# Patient Record
Sex: Female | Born: 1945 | Race: White | Hispanic: No | Marital: Married | Smoking: Never smoker
Health system: Southern US, Community
[De-identification: ages and names within clinical notes are randomized; demographics above are authoritative.]

## PROBLEM LIST (undated history)

## (undated) DIAGNOSIS — T884XXA Failed or difficult intubation, initial encounter: Secondary | ICD-10-CM

## (undated) DIAGNOSIS — R87619 Unspecified abnormal cytological findings in specimens from cervix uteri: Secondary | ICD-10-CM

## (undated) DIAGNOSIS — N870 Mild cervical dysplasia: Secondary | ICD-10-CM

## (undated) DIAGNOSIS — T8859XA Other complications of anesthesia, initial encounter: Secondary | ICD-10-CM

## (undated) DIAGNOSIS — M549 Dorsalgia, unspecified: Secondary | ICD-10-CM

## (undated) DIAGNOSIS — M542 Cervicalgia: Secondary | ICD-10-CM

## (undated) DIAGNOSIS — B001 Herpesviral vesicular dermatitis: Secondary | ICD-10-CM

## (undated) DIAGNOSIS — M4307 Spondylolysis, lumbosacral region: Secondary | ICD-10-CM

## (undated) DIAGNOSIS — M1990 Unspecified osteoarthritis, unspecified site: Secondary | ICD-10-CM

## (undated) DIAGNOSIS — J383 Other diseases of vocal cords: Secondary | ICD-10-CM

## (undated) DIAGNOSIS — I499 Cardiac arrhythmia, unspecified: Secondary | ICD-10-CM

## (undated) DIAGNOSIS — M5127 Other intervertebral disc displacement, lumbosacral region: Secondary | ICD-10-CM

## (undated) DIAGNOSIS — S32009K Unspecified fracture of unspecified lumbar vertebra, subsequent encounter for fracture with nonunion: Secondary | ICD-10-CM

## (undated) DIAGNOSIS — G4700 Insomnia, unspecified: Secondary | ICD-10-CM

## (undated) HISTORY — PX: CATARACT EXTRACTION W/ INTRAOCULAR LENS  IMPLANT, BILATERAL: SHX1307

## (undated) HISTORY — DX: Unspecified abnormal cytological findings in specimens from cervix uteri: R87.619

## (undated) HISTORY — PX: DILATION AND CURETTAGE OF UTERUS: SHX78

## (undated) HISTORY — PX: COLPOSCOPY: SHX161

## (undated) HISTORY — DX: Herpesviral vesicular dermatitis: B00.1

## (undated) HISTORY — DX: Unspecified fracture of unspecified lumbar vertebra, subsequent encounter for fracture with nonunion: S32.009K

## (undated) HISTORY — DX: Other diseases of vocal cords: J38.3

## (undated) HISTORY — DX: Other intervertebral disc displacement, lumbosacral region: M51.27

## (undated) HISTORY — PX: TONSILLECTOMY: SUR1361

## (undated) HISTORY — PX: BACK SURGERY: SHX140

## (undated) HISTORY — PX: TUBAL LIGATION: SHX77

## (undated) HISTORY — DX: Dorsalgia, unspecified: M54.9

## (undated) HISTORY — DX: Spondylolysis, lumbosacral region: M43.07

## (undated) HISTORY — PX: COLONOSCOPY: SHX174

## (undated) HISTORY — DX: Cervicalgia: M54.2

## (undated) HISTORY — DX: Mild cervical dysplasia: N87.0

## (undated) HISTORY — PX: KNEE SURGERY: SHX244

## (undated) HISTORY — PX: JOINT REPLACEMENT: SHX530

---

## 1997-11-02 ENCOUNTER — Other Ambulatory Visit: Admission: RE | Admit: 1997-11-02 | Discharge: 1997-11-02 | Payer: Self-pay | Admitting: Obstetrics and Gynecology

## 1998-11-09 ENCOUNTER — Other Ambulatory Visit: Admission: RE | Admit: 1998-11-09 | Discharge: 1998-11-09 | Payer: Self-pay | Admitting: Obstetrics and Gynecology

## 1998-11-25 ENCOUNTER — Encounter (INDEPENDENT_AMBULATORY_CARE_PROVIDER_SITE_OTHER): Payer: Self-pay

## 1998-11-25 ENCOUNTER — Other Ambulatory Visit: Admission: RE | Admit: 1998-11-25 | Discharge: 1998-11-25 | Payer: Self-pay | Admitting: Obstetrics and Gynecology

## 2000-02-08 ENCOUNTER — Other Ambulatory Visit: Admission: RE | Admit: 2000-02-08 | Discharge: 2000-02-08 | Payer: Self-pay | Admitting: Obstetrics and Gynecology

## 2000-08-14 ENCOUNTER — Encounter: Admission: RE | Admit: 2000-08-14 | Discharge: 2000-08-14 | Payer: Self-pay | Admitting: Obstetrics and Gynecology

## 2000-08-14 ENCOUNTER — Encounter: Payer: Self-pay | Admitting: Obstetrics and Gynecology

## 2001-02-14 ENCOUNTER — Other Ambulatory Visit: Admission: RE | Admit: 2001-02-14 | Discharge: 2001-02-14 | Payer: Self-pay | Admitting: Obstetrics and Gynecology

## 2001-10-14 ENCOUNTER — Encounter: Admission: RE | Admit: 2001-10-14 | Discharge: 2001-10-14 | Payer: Self-pay | Admitting: Obstetrics and Gynecology

## 2001-10-14 ENCOUNTER — Encounter: Payer: Self-pay | Admitting: Obstetrics and Gynecology

## 2002-01-22 HISTORY — PX: ROTATOR CUFF REPAIR: SHX139

## 2002-04-07 ENCOUNTER — Other Ambulatory Visit: Admission: RE | Admit: 2002-04-07 | Discharge: 2002-04-07 | Payer: Self-pay | Admitting: Obstetrics and Gynecology

## 2003-05-22 ENCOUNTER — Emergency Department (HOSPITAL_COMMUNITY): Admission: EM | Admit: 2003-05-22 | Discharge: 2003-05-22 | Payer: Self-pay | Admitting: Emergency Medicine

## 2003-06-16 ENCOUNTER — Encounter: Admission: RE | Admit: 2003-06-16 | Discharge: 2003-06-16 | Payer: Self-pay | Admitting: Obstetrics and Gynecology

## 2003-06-24 ENCOUNTER — Other Ambulatory Visit: Admission: RE | Admit: 2003-06-24 | Discharge: 2003-06-24 | Payer: Self-pay | Admitting: Obstetrics and Gynecology

## 2004-01-23 HISTORY — PX: CERVICAL BIOPSY  W/ LOOP ELECTRODE EXCISION: SUR135

## 2004-06-29 ENCOUNTER — Other Ambulatory Visit: Admission: RE | Admit: 2004-06-29 | Discharge: 2004-06-29 | Payer: Self-pay | Admitting: Addiction Medicine

## 2004-08-15 ENCOUNTER — Encounter: Admission: RE | Admit: 2004-08-15 | Discharge: 2004-08-15 | Payer: Self-pay | Admitting: Obstetrics and Gynecology

## 2005-03-07 ENCOUNTER — Other Ambulatory Visit: Admission: RE | Admit: 2005-03-07 | Discharge: 2005-03-07 | Payer: Self-pay | Admitting: Obstetrics and Gynecology

## 2005-09-07 ENCOUNTER — Encounter: Admission: RE | Admit: 2005-09-07 | Discharge: 2005-09-07 | Payer: Self-pay | Admitting: Obstetrics and Gynecology

## 2005-10-01 ENCOUNTER — Other Ambulatory Visit: Admission: RE | Admit: 2005-10-01 | Discharge: 2005-10-01 | Payer: Self-pay | Admitting: Obstetrics and Gynecology

## 2006-03-25 ENCOUNTER — Other Ambulatory Visit: Admission: RE | Admit: 2006-03-25 | Discharge: 2006-03-25 | Payer: Self-pay | Admitting: Obstetrics and Gynecology

## 2006-10-25 ENCOUNTER — Other Ambulatory Visit: Admission: RE | Admit: 2006-10-25 | Discharge: 2006-10-25 | Payer: Self-pay | Admitting: Obstetrics and Gynecology

## 2006-10-28 ENCOUNTER — Encounter: Admission: RE | Admit: 2006-10-28 | Discharge: 2006-10-28 | Payer: Self-pay | Admitting: Obstetrics and Gynecology

## 2006-11-04 ENCOUNTER — Encounter: Admission: RE | Admit: 2006-11-04 | Discharge: 2006-11-04 | Payer: Self-pay | Admitting: Obstetrics and Gynecology

## 2007-02-21 ENCOUNTER — Other Ambulatory Visit: Admission: RE | Admit: 2007-02-21 | Discharge: 2007-02-21 | Payer: Self-pay | Admitting: Obstetrics and Gynecology

## 2007-04-03 ENCOUNTER — Encounter: Admission: RE | Admit: 2007-04-03 | Discharge: 2007-04-03 | Payer: Self-pay | Admitting: Neurology

## 2007-08-18 ENCOUNTER — Inpatient Hospital Stay (HOSPITAL_COMMUNITY): Admission: RE | Admit: 2007-08-18 | Discharge: 2007-08-21 | Payer: Self-pay | Admitting: Orthopedic Surgery

## 2007-09-08 ENCOUNTER — Encounter: Admission: RE | Admit: 2007-09-08 | Discharge: 2007-10-08 | Payer: Self-pay | Admitting: Orthopedic Surgery

## 2007-10-31 ENCOUNTER — Encounter: Admission: RE | Admit: 2007-10-31 | Discharge: 2007-10-31 | Payer: Self-pay | Admitting: Obstetrics and Gynecology

## 2007-11-07 ENCOUNTER — Other Ambulatory Visit: Admission: RE | Admit: 2007-11-07 | Discharge: 2007-11-07 | Payer: Self-pay | Admitting: Obstetrics and Gynecology

## 2007-11-07 ENCOUNTER — Ambulatory Visit: Payer: Self-pay | Admitting: Obstetrics and Gynecology

## 2007-11-07 ENCOUNTER — Encounter: Payer: Self-pay | Admitting: Obstetrics and Gynecology

## 2007-11-10 ENCOUNTER — Encounter: Admission: RE | Admit: 2007-11-10 | Discharge: 2007-11-10 | Payer: Self-pay | Admitting: Obstetrics and Gynecology

## 2007-11-24 ENCOUNTER — Encounter: Admission: RE | Admit: 2007-11-24 | Discharge: 2007-11-24 | Payer: Self-pay | Admitting: Obstetrics and Gynecology

## 2008-02-02 ENCOUNTER — Ambulatory Visit (HOSPITAL_COMMUNITY): Admission: RE | Admit: 2008-02-02 | Discharge: 2008-02-02 | Payer: Self-pay | Admitting: Internal Medicine

## 2008-04-28 ENCOUNTER — Encounter: Admission: RE | Admit: 2008-04-28 | Discharge: 2008-04-28 | Payer: Self-pay | Admitting: Obstetrics and Gynecology

## 2008-06-30 ENCOUNTER — Encounter: Payer: Self-pay | Admitting: Obstetrics and Gynecology

## 2008-06-30 ENCOUNTER — Ambulatory Visit: Payer: Self-pay | Admitting: Obstetrics and Gynecology

## 2008-06-30 ENCOUNTER — Other Ambulatory Visit: Admission: RE | Admit: 2008-06-30 | Discharge: 2008-06-30 | Payer: Self-pay | Admitting: Obstetrics and Gynecology

## 2008-07-21 ENCOUNTER — Ambulatory Visit (HOSPITAL_COMMUNITY): Admission: RE | Admit: 2008-07-21 | Discharge: 2008-07-21 | Payer: Self-pay | Admitting: Specialist

## 2008-07-29 ENCOUNTER — Encounter: Admission: RE | Admit: 2008-07-29 | Discharge: 2008-07-29 | Payer: Self-pay | Admitting: Specialist

## 2008-08-12 ENCOUNTER — Encounter: Admission: RE | Admit: 2008-08-12 | Discharge: 2008-08-12 | Payer: Self-pay | Admitting: Specialist

## 2008-09-03 ENCOUNTER — Encounter: Admission: RE | Admit: 2008-09-03 | Discharge: 2008-09-03 | Payer: Self-pay | Admitting: Specialist

## 2008-11-03 ENCOUNTER — Encounter: Admission: RE | Admit: 2008-11-03 | Discharge: 2008-11-03 | Payer: Self-pay | Admitting: Obstetrics and Gynecology

## 2008-12-01 ENCOUNTER — Other Ambulatory Visit: Admission: RE | Admit: 2008-12-01 | Discharge: 2008-12-01 | Payer: Self-pay | Admitting: Obstetrics and Gynecology

## 2008-12-01 ENCOUNTER — Encounter: Payer: Self-pay | Admitting: Obstetrics and Gynecology

## 2008-12-01 ENCOUNTER — Ambulatory Visit: Payer: Self-pay | Admitting: Obstetrics and Gynecology

## 2009-01-31 ENCOUNTER — Encounter: Admission: RE | Admit: 2009-01-31 | Discharge: 2009-01-31 | Payer: Self-pay | Admitting: Specialist

## 2009-05-26 ENCOUNTER — Other Ambulatory Visit: Admission: RE | Admit: 2009-05-26 | Discharge: 2009-05-26 | Payer: Self-pay | Admitting: Obstetrics and Gynecology

## 2009-05-26 ENCOUNTER — Ambulatory Visit: Payer: Self-pay | Admitting: Obstetrics and Gynecology

## 2009-05-27 ENCOUNTER — Ambulatory Visit (HOSPITAL_COMMUNITY): Admission: RE | Admit: 2009-05-27 | Discharge: 2009-05-27 | Payer: Self-pay | Admitting: Neurological Surgery

## 2009-06-09 ENCOUNTER — Inpatient Hospital Stay (HOSPITAL_COMMUNITY): Admission: RE | Admit: 2009-06-09 | Discharge: 2009-06-11 | Payer: Self-pay | Admitting: Neurological Surgery

## 2009-07-05 ENCOUNTER — Encounter (INDEPENDENT_AMBULATORY_CARE_PROVIDER_SITE_OTHER): Payer: Self-pay | Admitting: *Deleted

## 2009-12-08 ENCOUNTER — Encounter: Admission: RE | Admit: 2009-12-08 | Discharge: 2009-12-08 | Payer: Self-pay | Admitting: Obstetrics and Gynecology

## 2009-12-19 ENCOUNTER — Other Ambulatory Visit: Admission: RE | Admit: 2009-12-19 | Discharge: 2009-12-19 | Payer: Self-pay | Admitting: Obstetrics and Gynecology

## 2009-12-19 ENCOUNTER — Ambulatory Visit: Payer: Self-pay | Admitting: Obstetrics and Gynecology

## 2009-12-19 ENCOUNTER — Encounter (INDEPENDENT_AMBULATORY_CARE_PROVIDER_SITE_OTHER): Payer: Self-pay | Admitting: *Deleted

## 2009-12-22 ENCOUNTER — Encounter (INDEPENDENT_AMBULATORY_CARE_PROVIDER_SITE_OTHER): Payer: Self-pay | Admitting: *Deleted

## 2009-12-23 ENCOUNTER — Ambulatory Visit: Payer: Self-pay | Admitting: Internal Medicine

## 2010-01-19 ENCOUNTER — Ambulatory Visit: Payer: Self-pay | Admitting: Internal Medicine

## 2010-01-19 ENCOUNTER — Encounter (INDEPENDENT_AMBULATORY_CARE_PROVIDER_SITE_OTHER): Payer: Self-pay | Admitting: *Deleted

## 2010-02-21 NOTE — Letter (Signed)
Summary: Colonoscopy Letter  [facility]  [address] [place], [place] [address]   Phone: [phone]  Fax: [phone]      July 05, 2009 MRN: [id number]   Catherine [name] [address] [place], [place]  [address]   Dear Ms. Spirito,   According to your medical record, it is time for you to schedule a Colonoscopy. The American Cancer Society recommends this procedure as a method to detect early colon cancer. Patients with a family history of colon cancer, or a personal history of colon polyps or inflammatory bowel disease are at increased risk.  This letter has beeen generated based on the recommendations made at the time of your procedure. If you feel that in your particular situation this may no longer apply, please contact our office.  Please call our office at [phone] to schedule this appointment or to update your records at your earliest convenience.  Thank you for cooperating with [place] to provide you with the very best care possible.   Sincerely,  Hedwig Morton. Juanda Chance, M.D.  [facility] Gastroenterology Division [phone]

## 2010-02-21 NOTE — Letter (Signed)
Summary: [facility] Instructions  [facility]  [address] [place], [place] [address]   Phone: [phone]  Fax: [phone]       Catherine [name]    Jul 28, 1945    MRN: [id number]       Procedure Day Dorna Bloom:  Lenor Coffin  01/19/10     Arrival Time: 8:00AM     Procedure Time: 9:00AM    Location of Procedure:                    _ X_  [facility] (4th Floor)   PREPARATION FOR COLONOSCOPY WITH MIRALAX  Starting 5 days prior to your procedure 01/14/10 do not eat nuts, seeds, popcorn, corn, beans, peas,  salads, or any raw vegetables.  Do not take any fiber supplements (e.g. Metamucil, Citrucel, and Benefiber). ____________________________________________________________________________________________________   THE DAY BEFORE YOUR PROCEDURE         DATE: 01/18/10  DAY: WEDNESDAY  1   Drink clear liquids the entire day-NO SOLID FOOD  2   Do not drink anything colored red or purple.  Avoid juices with pulp.  No orange juice.  3   Drink at least 64 oz. (8 glasses) of fluid/clear liquids during the day to prevent dehydration and help the prep work efficiently.  CLEAR LIQUIDS INCLUDE: Water Jello Ice Popsicles Tea (sugar ok, no milk/cream) Powdered fruit flavored drinks Coffee (sugar ok, no milk/cream) Gatorade Juice: apple, white grape, white cranberry  Lemonade Clear bullion, consomm, broth Carbonated beverages (any kind) Strained chicken noodle soup Hard Candy  4   Mix the entire bottle of Miralax with 64 oz. of Gatorade/Powerade in the morning and put in the refrigerator to chill.  5   At 3:00 pm take 2 Dulcolax/Bisacodyl tablets.  6   At 4:30 pm take one Reglan/Metoclopramide tablet.  7  Starting at 5:00 pm drink one 8 oz glass of the Miralax mixture every 15-20 minutes until you have finished drinking the entire 64 oz.  You should finish drinking prep around 7:30 or 8:00 pm.  8   If you are nauseated, you may take the 2nd Reglan/Metoclopramide  tablet at 6:30 pm.        9    At 8:00 pm take 2 more DULCOLAX/Bisacodyl tablets.     THE DAY OF YOUR PROCEDURE      DATE:  01/19/10  DAY: Lenor Coffin  You may drink clear liquids until 7:00AM  (2 HOURS BEFORE PROCEDURE).   MEDICATION INSTRUCTIONS  Unless otherwise instructed, you should take regular prescription medications with a small sip of water as early as possible the morning of your procedure.        OTHER INSTRUCTIONS  You will need a responsible adult at least 65 years of age to accompany you and drive you home.   This person must remain in the waiting room during your procedure.  Wear loose fitting clothing that is easily removed.  Leave jewelry and other valuables at home.  However, you may wish to bring a book to read or an iPod/MP3 player to listen to music as you wait for your procedure to start.  Remove all body piercing jewelry and leave at home.  Total time from sign-in until discharge is approximately 2-3 hours.  You should go home directly after your procedure and rest.  You can resume normal activities the day after your procedure.  The day of your procedure you should not:   Drive   Make legal decisions  Operate machinery   Drink alcohol   Return to work  You will receive specific instructions about eating, activities and medications before you leave.   The above instructions have been reviewed and explained to me by   Wyona Almas RN  December 23, 2009 2:48 PM     I fully understand and can verbalize these instructions _____________________________ Date _______

## 2010-02-21 NOTE — Miscellaneous (Signed)
Summary: [facility] Previsit/prep  Clinical Lists Changes  Medications: Added new medication of DULCOLAX 5 MG  TBEC (BISACODYL) Day before procedure take 2 at 3pm and 2 at 8pm. - Signed Added new medication of METOCLOPRAMIDE HCL 10 MG  TABS (METOCLOPRAMIDE HCL) As per prep instructions. - Signed Added new medication of MIRALAX   POWD (POLYETHYLENE GLYCOL 3350) As per prep  instructions. - Signed Rx of DULCOLAX 5 MG  TBEC (BISACODYL) Day before procedure take 2 at 3pm and 2 at 8pm.;  #4 x 0;  Signed;  Entered by: Wyona Almas RN;  Authorized by: Hart Carwin MD;  Method used: Electronically to [facility]*, [address] Shipping/mailing, [place], [place]  [address], Ph: [phone], Fax: [phone] Rx of METOCLOPRAMIDE HCL 10 MG  TABS (METOCLOPRAMIDE HCL) As per prep instructions.;  #2 x 0;  Signed;  Entered by: Wyona Almas RN;  Authorized by: Hart Carwin MD;  Method used: Electronically to [facility]*, [address] Shipping/mailing, [place], [place]  [address], Ph: [phone], Fax: [phone] Rx of MIRALAX   POWD (POLYETHYLENE GLYCOL 3350) As per prep  instructions.;  #255gm x 0;  Signed;  Entered by: Wyona Almas RN;  Authorized by: Hart Carwin MD;  Method used: Electronically to [facility]*, [address] Shipping/mailing, [place], [place]  [address], Ph: [phone], Fax: [phone] Observations: Added new observation of NKA: T (12/23/2009 14:08)    Prescriptions: MIRALAX   POWD (POLYETHYLENE GLYCOL 3350) As per prep  instructions.  #255gm x 0   Entered by:   Wyona Almas RN   Authorized by:   Hart Carwin MD   Signed by:   Wyona Almas RN on 12/23/2009   Method used:   Electronically to        [facility]* (retail)       [address]       [address] Shipping/mailing       [place], [place]  [address]       Ph: [phone]       Fax: [phone]  RxID:   [id number] METOCLOPRAMIDE HCL 10 MG  TABS (METOCLOPRAMIDE HCL) As per prep instructions.  #2 x 0   Entered by:   Wyona Almas RN   Authorized by:   Hart Carwin MD   Signed by:   Wyona Almas RN on 12/23/2009   Method used:   Electronically to        [facility]* (retail)       [address]       [address] Shipping/mailing       [place], [place]  [address]       Ph: [phone]       Fax: [phone]   RxID:   [id number] DULCOLAX 5 MG  TBEC (BISACODYL) Day before procedure take 2 at 3pm and 2 at 8pm.  #4 x 0   Entered by:   Wyona Almas RN   Authorized by:   Hart Carwin MD   Signed by:   Wyona Almas RN on 12/23/2009   Method used:   Electronically to        [facility]* (retail)       [address]       [address] Shipping/mailing       [place], [place]  [address]       Ph: [phone]       Fax: [phone]   RxID:   [id number]

## 2010-02-21 NOTE — Letter (Signed)
Summary: Pre Visit Letter Revised  [facility]  [address] [place], [place] [address]   Phone: [phone]  Fax: [phone]        12/19/2009 MRN: [id number] Catherine [name] [address] [place], [place]  [address]             Procedure Date:  01/03/2010  Welcome to the Gastroenterology Division at [facility].    You are scheduled to see a nurse for your pre-procedure visit on 12/23/2009 at 2:00 on the 3rd floor at [facility], [address]  We ask that you try to arrive at our office 15 minutes prior to your appointment time to allow for check-in.  Please take a minute to review the attached form.  If you answer "Yes" to one or more of the questions on the first page, we ask that you call the person listed at your earliest opportunity.  If you answer "No" to all of the questions, please complete the rest of the form and bring it to your appointment.    Your nurse visit will consist of discussing your medical and surgical history, your immediate family medical history, and your medications.   If you are unable to list all of your medications on the form, please bring the medication bottles to your appointment and we will list them.  We will need to be aware of both prescribed and over the counter drugs.  We will need to know exact dosage information as well.    Please be prepared to read and sign documents such as consent forms, a financial agreement, and acknowledgement forms.  If necessary, and with your consent, a friend or relative is welcome to sit-in on the nurse visit with you.  Please bring your insurance card so that we may make a copy of it.  If your insurance requires a referral to see a specialist, please bring your referral form from your primary care physician.  No co-pay is required for this nurse visit.     If you cannot keep your appointment, please call [phone] to cancel or reschedule prior to your appointment date.  This allows  [place] the opportunity to schedule an appointment for another patient in need of care.    Thank you for choosing [facility] for your medical needs.  We appreciate the opportunity to care for you.  Please visit [place] at our website  to learn more about our practice.  Sincerely, The Gastroenterology Division

## 2010-02-23 NOTE — Procedures (Signed)
Summary: Colonoscopy  Patient: [name] Note: All result statuses are Final unless otherwise noted.  Tests: (1) Colonoscopy (COL)   COL Colonoscopy           DONE     [facility]     [address]     [place], [place]  [address]           COLONOSCOPY PROCEDURE REPORT           PATIENT:  Catherine [name], Catherine [name]  MR#:  [id number]     BIRTHDATE:  28-Jun-1945, 64 yrs. old  GENDER:  female     ENDOSCOPIST:  Hedwig Morton. Juanda Chance, MD     REF. BY:  Theressa Millard, M.D.     PROCEDURE DATE:  01/19/2010     PROCEDURE:  Colonoscopy [address]     ASA CLASS:  Class I     INDICATIONS:  Routine Risk Screening last colon 2001,     diverticulosis     MEDICATIONS:   Versed 10 mg, Fentanyl 75 mcg           DESCRIPTION OF PROCEDURE:   After the risks benefits and     alternatives of the procedure were thoroughly explained, informed     consent was obtained.  Digital rectal exam was performed and     revealed no rectal masses.   The LB [id number]-[id number] endoscope     was introduced through the anus and advanced to the cecum, which     was identified by both the appendix and ileocecal valve, without     limitations.  The quality of the prep was excellent, using     MiraLax.  The instrument was then slowly withdrawn as the colon     was fully examined.     <<PROCEDUREIMAGES>>           FINDINGS:  Mild diverticulosis was found in the sigmoid colon (see     image1, image2, image6, and image7).  This was otherwise a normal     examination of the colon (see image8, image3, image4, and image5).     Retroflexed views in the rectum revealed no abnormalities.    The     scope was then withdrawn from the patient and the procedure     completed.           COMPLICATIONS:  None     ENDOSCOPIC IMPRESSION:     1) Mild diverticulosis in the sigmoid colon     2) Otherwise normal examination     RECOMMENDATIONS:     1) high fiber diet     REPEAT EXAM:  In 10 year(s) for.            ______________________________     Hedwig Morton. Juanda Chance, MD           CC:           n.     eSIGNED:   Hedwig Morton. Creedon Danielski at 01/19/2010 09:49 AM           Sid Falcon, [id number]  Note: An exclamation mark (!) indicates a result that was not dispersed into the flowsheet. Document Creation Date: 01/19/2010 9:49 AM _______________________________________________________________________  (1) Order result status: Final Collection or observation date-time: 01/19/2010 09:42 Requested date-time:  Receipt date-time:  Reported date-time:  Referring Physician:   Ordering Physician: Lina Sar [phone]) Specimen Source:  Source: Launa Grill Order Number: [id number] Lab site:   Appended Document: Colonoscopy Recall in [facility] for 10 years.  Appended Document: Colonoscopy    Clinical Lists Changes  Observations: Added new observation of COLONNXTDUE: 12/2019 (01/19/2010 14:38)

## 2010-02-23 NOTE — Miscellaneous (Signed)
  Clinical Lists Changes  Observations: Added new observation of COLONNXTDUE: 12/2019 (01/19/2010 9:52) Added new observation of COLONOSCOPY: 01/19/2010 (01/19/2010 9:52)

## 2010-04-11 LAB — CBC
HCT: 41.6 % (ref 36.0–46.0)
Hemoglobin: 14.6 g/dL (ref 12.0–15.0)
RBC: 4.38 MIL/uL (ref 3.87–5.11)
WBC: 5.5 10*3/uL (ref 4.0–10.5)

## 2010-04-11 LAB — BASIC METABOLIC PANEL
Calcium: 9.4 mg/dL (ref 8.4–10.5)
GFR calc Af Amer: >60 mL/min (ref >60)
GFR calc non Af Amer: >60 mL/min (ref >60)
Potassium: 4.5 mEq/L (ref 3.5–5.1)
Sodium: 138 mEq/L (ref 135–145)

## 2010-04-11 LAB — ABO/RH: ABO/RH(D): O POS

## 2010-04-11 LAB — TYPE AND SCREEN
ABO/RH(D): O POS
Antibody Screen: NEGATIVE

## 2010-04-11 LAB — SURGICAL PCR SCREEN: MRSA, PCR: NEGATIVE

## 2010-06-06 ENCOUNTER — Other Ambulatory Visit: Payer: Self-pay | Admitting: Orthopedic Surgery

## 2010-06-06 ENCOUNTER — Encounter (HOSPITAL_COMMUNITY): Payer: 59

## 2010-06-06 LAB — COMPREHENSIVE METABOLIC PANEL
ALT: 10 U/L (ref 0–35)
AST: 16 U/L (ref 0–37)
Albumin: 4.1 g/dL (ref 3.5–5.2)
Alkaline Phosphatase: 60 U/L (ref 39–117)
Calcium: 9.3 mg/dL (ref 8.4–10.5)
GFR calc Af Amer: >60 mL/min (ref >60)
Glucose, Bld: 102 mg/dL — ABNORMAL HIGH (ref 70–99)
Potassium: 4.4 mEq/L (ref 3.5–5.1)
Sodium: 136 mEq/L (ref 135–145)
Total Protein: 6.3 g/dL (ref 6.0–8.3)

## 2010-06-06 LAB — URINALYSIS, ROUTINE W REFLEX MICROSCOPIC
Bilirubin Urine: NEGATIVE
Hgb urine dipstick: NEGATIVE
Nitrite: NEGATIVE
Protein, ur: NEGATIVE mg/dL
Specific Gravity, Urine: 1.017 (ref 1.005–1.030)
Urobilinogen, UA: 0.2 mg/dL (ref 0.0–1.0)

## 2010-06-06 LAB — CBC
HCT: 39.6 % (ref 36.0–46.0)
MCHC: 34.3 g/dL (ref 30.0–36.0)
MCV: 89.8 fL (ref 78.0–100.0)
Platelets: 271 10*3/uL (ref 150–400)
RDW: 12 % (ref 11.5–15.5)
WBC: 8.1 10*3/uL (ref 4.0–10.5)

## 2010-06-06 LAB — PROTIME-INR
INR: 0.99 (ref 0.00–1.49)
Prothrombin Time: 13.3 seconds (ref 11.6–15.2)

## 2010-06-06 LAB — URINE MICROSCOPIC-ADD ON

## 2010-06-06 NOTE — Op Note (Signed)
[name], [name] NO.:  [id number]   MEDICAL RECORD NO.:  [id number]          PATIENT TYPE:  INP   LOCATION:  0010                         FACILITY:  [facility]   PHYSICIAN:  Ollen Gross, M.D.    DATE OF BIRTH:  Nov 19, 1945   DATE OF PROCEDURE:  08/18/2007  DATE OF DISCHARGE:                               OPERATIVE REPORT   PREOPERATIVE DIAGNOSIS:  Osteoarthritis, left knee.   POSTOPERATIVE DIAGNOSIS:  Osteoarthritis, left knee.   PROCEDURE:  Left total knee arthroplasty.   SURGEON:  Ollen Gross, M.D.   ASSISTANT:  Avel Peace PA-C   ANESTHESIA:  Spinal.   ESTIMATED BLOOD LOSS:  Minimal.   DRAINS:  None.   TOURNIQUET TIME:  33 minutes at 300 mmHg.   COMPLICATIONS:  None.   CONDITION:  Stable to recovery.   BRIEF CLINICAL NOTE:  Catherine [name] is a 65 year old female who has end-stage  arthritis of the left knee with progressively worsening pain and  dysfunction.  She has failed nonoperative management including  injections and presents for total knee arthroplasty.   PROCEDURE IN DETAIL:  After the successful administration of spinal  anesthetic, a tourniquet was placed high on the left thigh and left  lower extremity prepped and draped in usual sterile fashion.  Extremity  was wrapped in Esmarch, knee flexed, tourniquet inflated to 300 mmHg.  Midline incision made with 10 blade through subcutaneous tissue to the  level of the extensor mechanism.  Fresh blade was used make a lateral  parapatellar arthrotomy.  I went lateral because of preop valgus  deformity.  Soft tissue over proximal lateral tibia subperiosteally  elevated to the posterolateral corner but not including the structures  of the posterolateral corner.  Patella was everted medially, knee flexed  90 degrees, ACL and PCL removed.  She had severe bone-on-bone change in  the lateral compartment both tibia and femur and also erosive changes on  the patella with thinning of the patella.  A drill  was used create a  starting hole in the distal femur and the canal was thoroughly  irrigated.  5 degrees left valgus alignment guide is placed and  referencing off the posterior condyles, rotations marked and the block  pinned to remove 11 mm of distal femur.  Distal femoral resection is  made with an oscillating saw.  Sizing block was placed, size 2.5 was  most appropriate.  Rotations marked off the epicondylar axis.  Size 2.5  cutting block was placed and the anterior, posterior and chamfer cuts  made.   Tibia subluxed forward and menisci removed.  Extramedullary tibial  alignment guide was placed referencing proximally at medial aspect of  the tibial tubercle and distally along the second metatarsal axis and  tibial crest.  Block was pinned to remove 2 mm of the more deficient  lateral side.  Tibial resection was made with an oscillating saw.  Size  2.5 was the most appropriate tibial component and the proximal tibia  prepared the modular drill and keel punch for size 2.5.  Femoral  preparation is completed with the intercondylar cut.  Size 2.5 mobile bearing tibial trial, 2.5 posterior stabilized femoral  trial and a 10 mm posterior stabilized rotating platform insert trial  placed.  With the 10, full extension was achieved with excellent varus-  valgus, anterior-posterior balance throughout full range of motion.  Patella was everted, thickness measured to be only 16 mm.  I removed the  patellar osteophytes and resected down to 11 mm.  I placed a 35  template.  The lug holes were drilled, the trial patella was placed,  size 35 and it tracked normally.  The osteophytes were then removed off  the posterior femur with the trials in place.  All trials removed and  the cut bone surfaces prepared with pulsatile lavage.  Cement was mixed.  Once ready for implantation, size 2.5 mobile bearing tibial tray, 2.5  posterior stabilized femur and 35 patella are cemented in place.  The  patella  was held with a clamp.  Trial 10-mm insert was placed, knee held  in full extension and all extruded cement removed.  Once the cement was  fully hardened, then FloSeal was injected on the posterior capsule and  the permanent 10 mm posterior stabilized rotating platform insert was  placed in the tibial tray.  FloSeal was injected in medial and lateral  gutters and suprapatellar area.  Moist sponge is placed and tourniquet  released with a total time of 33 minutes.  Sponge was held for 2 minutes  then removed.  Minimal bleeding was encountered.  That which was  encountered was stopped with electrocautery.  Wound was copiously  irrigated with saline solution and the arthrotomy closed with  interrupted #1 PDS leaving open a small area from the superior to  inferior pole of patella to serve as a mini lateral release.  Flexion  against gravity to 145 degrees with patellar tracking normally.  Subcu  was closed with interrupted 2-0 Vicryl and subcuticular running 4-0  Monocryl.  The incision was cleaned and dried and Steri-Strips and bulky  sterile dressing applied.  She was then placed into a knee immobilizer,  awakened, transferred recovery in stable condition.      Ollen Gross, M.D.  Electronically Signed     FA/MEDQ  D:  08/18/2007  T:  08/19/2007  Job:  [id number]

## 2010-06-06 NOTE — H&P (Signed)
Catherine, [name] NO.:  [id number]   MEDICAL RECORD NO.:  [id number]          PATIENT TYPE:  INP   LOCATION:  NA                           FACILITY:  [facility]   PHYSICIAN:  Ollen Gross, M.D.    DATE OF BIRTH:  Oct 03, 1945   DATE OF ADMISSION:  08/18/2007  DATE OF DISCHARGE:                              HISTORY & PHYSICAL   CHIEF COMPLAINT:  Left greater than right knee pain.   HISTORY OF PRESENT ILLNESS:  Patient is a 65 year old female has been  seen by Dr. Lequita Halt for ongoing knee problems.  She has had problems for  quite some time now.  Dr. Thomasena Edis has scoped her knees and done lateral  releases in the past.  She has also had multiple series of cortisone and  Hyalgan injections.  The last series of hyaluronic injections did not  benefit her at all.  The left knee has gotten to the point where it is  hurting all the time.  She was referred over by several friends for  possible replacement.  She has been seen by Dr. Lequita Halt and found to  have severe end-stage arthritis of the knees with a 10 degree valgus  malalignment deformity.  It is felt she would benefit undergoing  surgical intervention.  Risks and benefits discussed.  The patient is  subsequently admitted to the  hospital.   ALLERGIES:  No known drug allergies.   CURRENT MEDICATIONS:  Celebrex, Femhrt, generic Ambien, vitamin once a  day, vitamin D, vitamin C, fish oil, flaxseed oil.   PAST MEDICAL HISTORY:  Arthritis.   PAST SURGICAL HISTORY:  1. Right rotator cuff repair.  2. She has had right knee scope with lateral release.  3. Left knee scope with lateral release.  4. Benign tumor of the breast surgery.   SOCIAL HISTORY:  Married, nonsmoker.  She drinks 1-2 glasses of wine a  day. She has 4 children, 2 stepchildren.  Husband will be assisting  with care after surgery.   FAMILY HISTORY:  Father with history of bypass surgery.  Mother with  hypertension.  Sister and brother healthy.   REVIEW OF SYSTEMS:  GENERAL:  No fevers, chills or night sweats.  NEURO:  Little bit of insomnia.  No seizures, syncope or paralysis.  RESPIRATORY:  No shortness breath, productive cough or hemoptysis.  CARDIOVASCULAR:  Chest pain, angina or orthopnea.  GI:  No nausea,  vomiting, diarrhea or constipation.  GU: Little bit of nocturia and  frequency.  No dysuria or hematuria.  MUSCULOSKELETAL:  Knee pain.   PHYSICAL EXAMINATION:  VITAL SIGNS:  Pulse 64, respirations 12, blood  pressure 112/68.  GENERAL:  A 65 year old white female, small petite frame, no acute  distress.  She is alert, oriented and cooperative, pleasant, excellent  historian.  HEENT:  Normocephalic, atraumatic.  Pupils are round and reactive.  EOMs  intact.  NECK:  Supple.  No bruits.  CHEST: Clear.  HEART:  Regular rate and rhythm.  No murmur, S1-S2 noted.  ABDOMEN:  Soft, flat, nontender.  Bowel sounds present.  RECTAL, BREASTS, GENITALIA:  Not  done, not pertinent to present illness.  EXTREMITIES:  Left knee 10 degree valgus malalignment deformity.  Range  of motion 5-135.  Tender laterally, no instability.   IMPRESSION:  Osteoarthritis left knee.   PLAN:  The patient admitted to [facility] to undergo a left  total knee replacement arthroplasty.  Surgery will be performed by Dr.  Ollen Gross.      Alexzandrew L. Perkins, P.A.C.      Ollen Gross, M.D.  Electronically Signed    ALP/MEDQ  D:  08/17/2007  T:  08/17/2007  Job:  [id number]   cc:   Theressa Millard, M.D.  Fax: [phone]   Rande Brunt. Eda Paschal, M.D.  Fax: [phone]   Evie Lacks, MD  Fax: [phone]

## 2010-06-06 NOTE — Discharge Summary (Signed)
NAMEBRILEIGH, [name] NO.:  [id number]   MEDICAL RECORD NO.:  [id number]          PATIENT TYPE:  INP   LOCATION:  1621                         FACILITY:  [facility]   PHYSICIAN:  Ollen Gross, M.D.    DATE OF BIRTH:  04/08/1945   DATE OF ADMISSION:  08/18/2007  DATE OF DISCHARGE:  08/21/2007                               DISCHARGE SUMMARY   ADMITTING DIAGNOSES:  1. Osteoarthritis of the left knee.  2. General osteoarthritis.   DISCHARGE DIAGNOSES:  1. Osteoarthritis of the left knee, status post left total knee      replacement arthroplasty.  2. Mild postoperative hyponatremia, improved.  3. General osteoarthritis.   PROCEDURE:  August 18, 2007 - left total knee.  Surgeon - Dr. Lequita Halt.  Assistant Avel Peace, PA-C.  Anesthesia - general.   CONSULTATIONS:  None.   BRIEF HISTORY:  Catherine [name] is a 65 year old female who has end-stage  arthritis of the left knee with progressive worsening pain and  dysfunction that have failed outpatient management and now presents for  total knee arthroplasty.   LABORATORY:  Preoperative CBC showed a hemoglobin of 14.4, hematocrit of  42.4, white cell count 6.3, platelets 325.  Postoperative hemoglobin  13.2 and dropped down to 12.2.  Last noted hemoglobin and hematocrit of  11.9 and 33.7.  PT/PTT preoperatively 13.2 and 26 respectively.  INR is  1.0.  Serial pro times  followed.  PT/INR of 18 and 61.5.  Chem panel on  admission slightly low sodium of 133, slightly elevated potassium of  5.2.  Remaining Chem panel within limits.  Serial BMETs were followed.  Sodium did drop down to 132, back up to 137.  Potassium came down to a  normal level of 4.6.  Preoperative UA was negative.  Blood group type  O+.  EKG dated August 12, 2007 revealed normal sinus rhythm, low-voltage  QRS.  EKG confirmed with Dr. Dietrich Pates.   HOSPITAL COURSE:  The patient was admitted to [facility],  tolerated the procedure well, and later transferred  to the recovery room  and orthopedic floor, started on PCA and p.o. analgesics for pain  control following surgery.  Did not get much rest on the evening of  surgery due to pain.  Doing a little bit better with pain control on the  morning of day #1.  Started getting up out of bed.  Hemoglobin was  stable.  Sodium was down just a little bit.  Continued the PCA Dilaudid.  I felt that the fluids was a delusional component with the sodium.  Decreased her fluid rate.  Started getting up out of bed.  Actually did  very well, walked about 40 feet.  By day #2, already had been walking in  the hallway.  Sodium was back up to a normal level.  Dressing change -  incision looked good.  Continued to progress with physical therapy.  Discharge planning, started making arrangements for home.  She was  walking up to 300 feet by day #3 and was discharged home on p.o.  medications.   DISCHARGE PLAN:  1.  The patient was discharged home on August 21, 2007.  2. Discharge diagnoses - please see above.  3. Discharge medications - Vicodin, Robaxin, Restoril and Coumadin.   DIET:  As tolerated.   FOLLOWUP:  Follow up in 2 weeks.   ACTIVITY:  Weightbearing as tolerated left leg.  Total knee protocol.  Home health PT, home health nursing.   DISPOSITION:  Home.   CONDITION ON DISCHARGE:  Improving.      Alexzandrew L. Perkins, P.A.C.      Ollen Gross, M.D.  Electronically Signed    ALP/MEDQ  D:  10/02/2007  T:  10/02/2007  Job:  [id number]   cc:   Theressa Millard, M.D.  Fax: [phone]   Evie Lacks, MD  Fax: [phone]   Rande Brunt. Eda Paschal, M.D.  Fax: [phone]

## 2010-06-14 ENCOUNTER — Inpatient Hospital Stay (HOSPITAL_COMMUNITY)
Admission: RE | Admit: 2010-06-14 | Discharge: 2010-06-16 | DRG: 468 | Disposition: A | Discharge status: Home Health | Payer: 59 | Source: Ambulatory Visit | Attending: Orthopedic Surgery | Admitting: Orthopedic Surgery

## 2010-06-14 DIAGNOSIS — M129 Arthropathy, unspecified: Secondary | ICD-10-CM | POA: Diagnosis present

## 2010-06-14 DIAGNOSIS — Y831 Surgical operation with implant of artificial internal device as the cause of abnormal reaction of the patient, or of later complication, without mention of misadventure at the time of the procedure: Secondary | ICD-10-CM | POA: Diagnosis present

## 2010-06-14 DIAGNOSIS — Z96659 Presence of unspecified artificial knee joint: Secondary | ICD-10-CM

## 2010-06-14 DIAGNOSIS — J387 Other diseases of larynx: Secondary | ICD-10-CM | POA: Diagnosis present

## 2010-06-14 DIAGNOSIS — T84029A Dislocation of unspecified internal joint prosthesis, initial encounter: Principal | ICD-10-CM | POA: Diagnosis present

## 2010-06-14 LAB — TYPE AND SCREEN: Antibody Screen: NEGATIVE

## 2010-06-15 LAB — BASIC METABOLIC PANEL
BUN: 12 mg/dL (ref 6–23)
CO2: 28 mEq/L (ref 19–32)
Chloride: 99 mEq/L (ref 96–112)
Creatinine, Ser: 0.61 mg/dL (ref 0.4–1.2)
Glucose, Bld: 120 mg/dL — ABNORMAL HIGH (ref 70–99)
Potassium: 4.1 mEq/L (ref 3.5–5.1)

## 2010-06-15 LAB — CBC
HCT: 35.8 % — ABNORMAL LOW (ref 36.0–46.0)
Hemoglobin: 12.4 g/dL (ref 12.0–15.0)
MCH: 31.2 pg (ref 26.0–34.0)
MCV: 89.9 fL (ref 78.0–100.0)
Platelets: 240 10*3/uL (ref 150–400)
RBC: 3.98 MIL/uL (ref 3.87–5.11)
WBC: 7.1 10*3/uL (ref 4.0–10.5)

## 2010-06-16 LAB — CBC
HCT: 33.9 % — ABNORMAL LOW (ref 36.0–46.0)
MCH: 30.8 pg (ref 26.0–34.0)
MCHC: 33.9 g/dL (ref 30.0–36.0)
MCV: 90.9 fL (ref 78.0–100.0)
Platelets: 247 10*3/uL (ref 150–400)
RDW: 12.4 % (ref 11.5–15.5)

## 2010-06-24 NOTE — H&P (Signed)
Catherine [name], Catherine [name]              ACCOUNT NO.:  [id number]  MEDICAL RECORD NO.:  [id number]           PATIENT TYPE:  I  LOCATION:  1535                         FACILITY:  [facility]  PHYSICIAN:  Ollen Gross, M.D.    DATE OF BIRTH:  January 21, 1946  DATE OF ADMISSION:  06/14/2010 DATE OF DISCHARGE:                             HISTORY & PHYSICAL   CHIEF COMPLAINT:  Left knee pain with laxity.  HISTORY OF PRESENT ILLNESS:  The patient is 65 year old female well- known to Dr. Ollen Gross having previously undergone knee replacement back in 2009.  Unfortunately, over the past year and half or so, she has developed some instability and laxity, and the patient is extremely active but the laxity and instability has started to impair her activities.  It is felt that she would benefit from undergoing revision. Procedure, risks and benefits have been discussed.  She elected to proceed with surgery.  ALLERGIES:  No known drug allergies.  CURRENT MEDICATIONS:  Celebrex, estradiol, fish oil, flaxseed oil, vitamin D, vitamin B12, Ambien.  PAST MEDICAL HISTORY:  Spasmodic dysphonia and arthritis.  PAST SURGICAL HISTORY: 1. Left total knee replacement in 2009. 2. Back surgery, 2-level laminectomy and fusion in 2011. 3. She has also had a right rotator cuff repair. 4. Right knee scope with lateral release. 5. Left knee scope with lateral release. 6. Benign tumor removal, breast surgery.  FAMILY HISTORY:  Father living, age 32.  Mother living, age 1.  SOCIAL HISTORY:  Married, retired Runner, broadcasting/film/video.  Nonsmoker.  No alcohol.  She does have a caregiver lined up, will not have stairs.  She does have a living will.  REVIEW OF SYSTEMS:  GENERAL:  No fevers, chills or night sweats. NEUROLOGIC:  No seizures, syncope or paralysis.  RESPIRATORY:  No shortness breath, productive cough or hemoptysis.  CARDIOVASCULAR:  No chest pain or orthopnea.  GI:  No nausea, vomiting, diarrhea or constipation.  GU:  No  dysuria, hematuria or discharge. MUSCULOSKELETAL:  Knee pain.  PHYSICAL EXAMINATION:  VITAL SIGNS:  Pulse 84, respirations 12, blood pressure 98/68. GENERAL:  The patient is a 65 year old white female, small petite frame, well-nourished, well-developed, no acute distress.  She is alert, oriented and cooperative. HEENT:  Normocephalic, atraumatic.  Pupils are round and reactive.  EOMs intact. NECK:  Supple.  No bruits. CHEST:  Clear. HEART:  Regular rate and rhythm.  No murmurs. ABDOMEN:  Soft, flat, nontender.  Bowel sounds present. RECTAL:  Not done, not pertinent to present illness. BREASTS:  Not done, not pertinent to present illness. GENITALIA:  Not done, not pertinent to present illness. EXTREMITIES:  Left knee, previous incision is well healed.  Range of motion 5-130.  She does have some varus and valgus laxity noted.  Also, some mild anterior-posterior laxity noted on exam.  IMPRESSION:  Left knee pain with instability.  PLAN:  Revision, left total knee arthroplasty.  Surgery will be performed by Dr. Ollen Gross.     Alexzandrew L. Julien Girt, P.A.C.   ______________________________ Ollen Gross, M.D.    ALP/MEDQ  D:  06/15/2010  T:  06/15/2010  Job:  [id number]  cc:  Theressa Millard, M.D. Fax: [phone]  Rande Brunt. Eda Paschal, M.D. Fax: [phone]  Electronically Signed by Patrica Duel P.A.C. on 06/20/2010 07:11:52 AM Electronically Signed by Ollen Gross M.D. on 06/24/2010 04:06:15 PM

## 2010-06-24 NOTE — Op Note (Signed)
NAMEMARIAMAWIT, [name]              ACCOUNT NO.:  [id number]  MEDICAL RECORD NO.:  [id number]           PATIENT TYPE:  I  LOCATION:  1535                         FACILITY:  [facility]  PHYSICIAN:  Ollen Gross, M.D.    DATE OF BIRTH:  Mar 14, 1945  DATE OF PROCEDURE:  06/14/2010 DATE OF DISCHARGE:                              OPERATIVE REPORT   PREOPERATIVE DIAGNOSIS:  Unstable left total knee arthroplasty.  POSTOPERATIVE DIAGNOSIS:  Unstable left total knee arthroplasty.  PROCEDURE:  Left knee tibial polyethylene revision.  SURGEON:  Ollen Gross, M.D.  ASSISTANT:  Alexzandrew L. Perkins, P.A.C.  ANESTHESIA:  Spinal.  ESTIMATED BLOOD LOSS:  Minimal.  DRAINS:  Hemovac x1.  TOURNIQUET TIME:  19 minutes at 300 mmHg.  COMPLICATIONS:  None.  CONDITION:  Stable to recovery room.  BRIEF CLINICAL NOTE:  The patient is a 65 year old female, had a left total knee arthroplasty performed approximately 3 years ago.  Initially, she was very well over the past year or so.  She had episodes where the knee has felt unstable and she is starting to have increased pain in the knee.  Exam did show that she developed hyperextension and some varus- valgus laxity in the knee.  It was felt that due to this new onset laxity that the knee be revised, hopefully with just a thicker polyethylene, possibly with more formal revision.  She presents now for the revision.  PROCEDURE IN DETAIL:  After successful administration of spinal anesthetic, a tourniquet was placed on her left thigh and her left lower extremity was prepped and draped in usual sterile fashion.  Extremity was wrapped in Esmarch, knee flexed and tourniquet inflated to 300 mmHg. __________ subcutaneous tissue to level of the anterior mechanism.  A fresh blade was used make a medial parapatellar arthrotomy.  We did not encounter any fluid while entering the joint.  Soft tissue of the proximal and medial tibia was subperiosteally  elevated at the joint line with a knife and similar portion with Cobb elevator.  Soft tissue laterally was elevated with attention being paid to avoiding patellar tendon on tibial tubercle.  I was able to evert the patella and flex the knee 90 degrees.  I was unable to sublux the tibia forward and the polyethylene was easily removed.  Please note that on the exam given anesthesia prior to case that there was fairly gross varus-valgus laxity and there was about 10 degrees of hyperextension.  We then inspected the femoral and tibial components, both in excellent alignment and both are well fixed.  No signs of loosening.  The tissue in the joint looked healthy and normal.  No evidence of any __________ appearing tissue.  We then began trialing inserts.  I tried a 15 first and with the 15 mm posterior stabilized rotating platform insert, full extension was achieved but there was a tiny bit of varus-valgus play. We tried up with 17.5 which still allowed full extension with absolute no laxity throughout the full range of motion.  There was no varus- valgus or anterior-posterior laxity noted.  We then removed the trial and placed a size 2.5 x  17.5 mm thickness rotating platform posterior stabilized insert into the tibial tray.  Once again, full extension was achieved, and had no evidence of any hyperextension.  She had excellent varus-valgus and anterior-posterior stability throughout full range of motion.  Wound was then copiously irrigated with saline solution and the arthrotomy closed over Hemovac drain with interrupted #1 PDS.  Flexion against gravity was about 140 degrees and patella tracks normally. Tourniquet was released after total time of 19 minutes.  Subcu was closed with interrupted 2-0 Vicryl, subcuticular running 4-0 Monocryl. The drain was hooked to suction.  The catheter for Marcaine pain pump was placed and pump initiated.  Incisions cleaned and dried and Steri- Strips and a  bulky sterile dressing were applied.  She was then awakened and transported to Recovery in stable condition.     Ollen Gross, M.D.     FA/MEDQ  D:  06/14/2010  T:  06/14/2010  Job:  [id number]  Electronically Signed by Ollen Gross M.D. on 06/24/2010 04:06:12 PM

## 2010-08-02 NOTE — Discharge Summary (Signed)
NAMEROBECCA, Catherine [name] NO.:  [id number]  MEDICAL RECORD NO.:  [id number]  LOCATION:  1535                         FACILITY:  [facility]  PHYSICIAN:  Ollen Gross, M.D.    DATE OF BIRTH:  [birth year]  DATE OF ADMISSION:  06/14/2010 DATE OF DISCHARGE:  06/16/2010                              DISCHARGE SUMMARY   ADMITTING DIAGNOSES: 1. Left knee pain with laxity. 2. History of spasmodic dysphonia. 3. Osteoarthritis.  DISCHARGE DIAGNOSES: 1. Unstable left total knee arthroplasty, status post left total knee     tibial polyethylene revision. 2. History of spasmodic dysphonia. 3. Osteoarthritis.  PROCEDURE:  Jun 14, 2010, left knee tibial polyethylene revision.  SURGEON:  Ollen Gross, M.D.  ASSISTANT:  Alexzandrew L. Perkins, P.A.C.  ANESTHESIA:  Spinal anesthesia.  TOURNIQUET TIME:  19 minutes.  CONSULTS:  None.  BRIEF HISTORY:  The patient is a 65 year old female with a left total knee arthroplasty approximately 3 years ago.  Initially did very well but over the past year or so, she had some episodes where the knee felt unstable and started have increasing pain.  She developed some hyperextension, some varus and valgus laxity, it was felt this new onset the knee needed to be revised and hopefully change out to a thicker polyethylene, subsequently admitted for surgery.  LABORATORY DATA:  Admission CBC is not scanned into this chart. Followup CBCs on day 1 and day 2 showed hemoglobin of 12.4 and last H and H 11.5 and 33.9.  Admission Chem panel was done but again not scanned into this chart.  The followup BMET on day 1, slight low sodium of 133, glucose 120, otherwise BMET within normal limits.  Blood group type O positive.  HOSPITAL COURSE:  The patient was admitted to [facility] and taken to OR, underwent above-stated procedure without complication.  The patient tolerated the procedure well and later transferred to recovery room on the  orthopedic floor, given p.o. and IV analgesics for pain control following surgery, 24 hours of IV antibiotics.  She was doing very well.  Pain was under good control.  She was seen in rounds by Dr. Lequita Halt, started getting up out of bed.  Hemoglobin was 12.4.  Sodium was 133 but felt to be a little dilutional component.  She is having a little difficulty sleeping at night, so we changed her to Ambien.  She got up with therapy and walked over 400 feet on day 1 and did very well with physical therapy.  So on the morning of day 2, she was tolerating her pills well.  There were no complaints.  She was working with therapy meter goals and was discharged home.  DISCHARGE PLAN: 1. The patient was discharged home on Jun 16, 2010. 2. Discharge diagnoses, please see above. 3. Discharge medications, Robaxin, Percocet, Phenergan, Xarelto.     Continue home meds of Celebrex, multivitamin, generic Ambien.  DIET:  As tolerated.  ACTIVITY:  She is weightbearing as tolerated, total knee protocol, home health therapy.  Follow up in 2 weeks.  DISPOSITION:  Home.  CONDITION ON DISCHARGE:  Improving.     Alexzandrew L. Julien Girt, P.A.C.   ______________________________ Ollen Gross, M.D.  ALP/MEDQ  D:  07/24/2010  T:  07/24/2010  Job:  [id number]  cc:   Theressa Millard, M.D. Fax: [phone]  Rande Brunt. Eda Paschal, M.D. Fax: [phone]  Electronically Signed by Patrica Duel P.A.C. on 07/25/2010 07:30:46 AM Electronically Signed by Ollen Gross M.D. on 08/02/2010 12:32:15 PM

## 2010-08-29 ENCOUNTER — Encounter: Payer: Self-pay | Admitting: Obstetrics and Gynecology

## 2010-08-29 ENCOUNTER — Ambulatory Visit (INDEPENDENT_AMBULATORY_CARE_PROVIDER_SITE_OTHER): Payer: 59 | Admitting: Obstetrics and Gynecology

## 2010-08-29 ENCOUNTER — Encounter: Payer: Self-pay | Admitting: Gynecology

## 2010-08-29 DIAGNOSIS — L293 Anogenital pruritus, unspecified: Secondary | ICD-10-CM

## 2010-08-29 DIAGNOSIS — B373 Candidiasis of vulva and vagina: Secondary | ICD-10-CM

## 2010-08-29 DIAGNOSIS — J029 Acute pharyngitis, unspecified: Secondary | ICD-10-CM

## 2010-08-29 DIAGNOSIS — N898 Other specified noninflammatory disorders of vagina: Secondary | ICD-10-CM

## 2010-08-29 MED ORDER — TERCONAZOLE 0.8 % VA CREA: 1.0000 | TOPICAL_CREAM | Freq: Every day | VAGINAL | Status: AC

## 2010-08-29 MED ORDER — FLUCONAZOLE 200 MG PO TABS: 200.0000 mg | ORAL_TABLET | Freq: Every day | ORAL | Status: AC

## 2010-08-29 NOTE — Progress Notes (Signed)
The patient came to see me as today with severe vulvar and vaginal itching. She has tried both Monistat and mytrex without any help. She also notices some soreness in her throat.  Throat: I can see no evidence of a strep throat. We will do a culture. Her tongue is somewhat coated consistent with oral thrush. External and BUS is within normal limits except for vulvitis. Vaginal exam shows significant irritation with wet prep positive for yeast, amine and clue cells.  Assessment: 1. Yeast vaginitis 2. Pharyngitis  Plan: 1 Diflucan 200 mg daily for 7 days. 2. Terconazole 3 cream. 3. Strep culture.

## 2010-10-20 LAB — CBC
HCT: 34.9 — ABNORMAL LOW
MCHC: 34
MCHC: 34.1
MCV: 93.8
MCV: 94.1
MCV: 94.8
Platelets: 292
Platelets: 301
Platelets: 310
RBC: 4.5
RDW: 11.9
RDW: 12.2
RDW: 12.3
WBC: 5.4

## 2010-10-20 LAB — PROTIME-INR
INR: 1
INR: 1.5
Prothrombin Time: 13.2
Prothrombin Time: 13.7
Prothrombin Time: 17.7 — ABNORMAL HIGH
Prothrombin Time: 18.6 — ABNORMAL HIGH

## 2010-10-20 LAB — URINALYSIS, ROUTINE W REFLEX MICROSCOPIC
Bilirubin Urine: NEGATIVE
Hgb urine dipstick: NEGATIVE
Ketones, ur: NEGATIVE
Nitrite: NEGATIVE
Urobilinogen, UA: 0.2

## 2010-10-20 LAB — COMPREHENSIVE METABOLIC PANEL
ALT: 14
AST: 22
CO2: 28
Calcium: 9.4
Creatinine, Ser: 0.72
GFR calc Af Amer: >60
GFR calc non Af Amer: >60
Glucose, Bld: 123 — ABNORMAL HIGH
Sodium: 133 — ABNORMAL LOW
Total Protein: 6.5

## 2010-10-20 LAB — BASIC METABOLIC PANEL
BUN: 11
BUN: 4 — ABNORMAL LOW
CO2: 27
Calcium: 8.6
Calcium: 8.6
Chloride: 98
Creatinine, Ser: 0.63
Creatinine, Ser: 0.69
GFR calc non Af Amer: >60
Glucose, Bld: 134 — ABNORMAL HIGH
Glucose, Bld: 136 — ABNORMAL HIGH
Potassium: 4.6

## 2010-10-20 LAB — TYPE AND SCREEN
ABO/RH(D): O POS
Antibody Screen: NEGATIVE

## 2010-10-20 LAB — APTT: aPTT: 26

## 2010-11-03 DIAGNOSIS — J385 Laryngeal spasm: Secondary | ICD-10-CM | POA: Insufficient documentation

## 2011-01-01 ENCOUNTER — Other Ambulatory Visit: Payer: Self-pay | Admitting: Obstetrics and Gynecology

## 2011-01-01 DIAGNOSIS — Z1231 Encounter for screening mammogram for malignant neoplasm of breast: Secondary | ICD-10-CM

## 2011-01-11 ENCOUNTER — Ambulatory Visit
Admission: RE | Admit: 2011-01-11 | Discharge: 2011-01-11 | Disposition: A | Discharge status: Home / Self Care | Payer: 59 | Source: Ambulatory Visit | Attending: Obstetrics and Gynecology | Admitting: Obstetrics and Gynecology

## 2011-01-11 DIAGNOSIS — Z1231 Encounter for screening mammogram for malignant neoplasm of breast: Secondary | ICD-10-CM

## 2011-01-25 ENCOUNTER — Other Ambulatory Visit: Payer: Self-pay | Admitting: Obstetrics and Gynecology

## 2011-04-12 ENCOUNTER — Other Ambulatory Visit: Payer: Self-pay | Admitting: *Deleted

## 2011-04-12 MED ORDER — ESTRADIOL-NORETHINDRONE ACET 0.5-0.1 MG PO TABS: 1.0000 | ORAL_TABLET | Freq: Every day | ORAL | Status: DC

## 2011-04-16 ENCOUNTER — Other Ambulatory Visit (HOSPITAL_COMMUNITY)
Admission: RE | Admit: 2011-04-16 | Discharge: 2011-04-16 | Disposition: A | Discharge status: Home / Self Care | Payer: 59 | Source: Ambulatory Visit | Attending: Obstetrics and Gynecology | Admitting: Obstetrics and Gynecology

## 2011-04-16 ENCOUNTER — Ambulatory Visit (INDEPENDENT_AMBULATORY_CARE_PROVIDER_SITE_OTHER): Payer: 59 | Admitting: Obstetrics and Gynecology

## 2011-04-16 ENCOUNTER — Encounter: Payer: Self-pay | Admitting: Obstetrics and Gynecology

## 2011-04-16 VITALS — BP 120/74 | Ht 62.0 in | Wt 112.0 lb

## 2011-04-16 DIAGNOSIS — Z1159 Encounter for screening for other viral diseases: Secondary | ICD-10-CM | POA: Insufficient documentation

## 2011-04-16 DIAGNOSIS — N870 Mild cervical dysplasia: Secondary | ICD-10-CM

## 2011-04-16 DIAGNOSIS — N951 Menopausal and female climacteric states: Secondary | ICD-10-CM

## 2011-04-16 DIAGNOSIS — Z780 Asymptomatic menopausal state: Secondary | ICD-10-CM

## 2011-04-16 DIAGNOSIS — B001 Herpesviral vesicular dermatitis: Secondary | ICD-10-CM | POA: Insufficient documentation

## 2011-04-16 DIAGNOSIS — Z01419 Encounter for gynecological examination (general) (routine) without abnormal findings: Secondary | ICD-10-CM

## 2011-04-16 MED ORDER — ESTRADIOL-NORETHINDRONE ACET 0.5-0.1 MG PO TABS: 1.0000 | ORAL_TABLET | Freq: Every day | ORAL | Status: DC

## 2011-04-16 NOTE — Progress Notes (Signed)
Patient came to see me today for her annual GYN exam. We have reduced her HRT in half and she is still doing well. She is up-to-date on mammograms. She's had a normal bone density. She does her lab through PCP. She had to have aneed revision done on her left knee replacement. She's had a spinal fusion done for spinal stenosis. She is having no vaginal bleeding. She is having no pelvic pain. She is having no bladder problems. She takes Zovirax for fever blisters. She does lab through her PCP.  Physical examination:  Kennon Portela present. HEENT within normal limits. Neck: Thyroid not large. No masses. Supraclavicular nodes: not enlarged. Breasts: Examined in both sitting and lying  position. No skin changes and no masses. Abdomen: Soft no guarding rebound or masses or hernia. Pelvic: External: Within normal limits. BUS: Within normal limits. Vaginal:within normal limits. Good estrogen effect. No evidence of cystocele rectocele or enterocele. Cervix: clean. Uterus: Normal size and shape. Adnexa: No masses. Rectovaginal exam: Confirmatory and negative. Extremities: Within normal limits.  Assessment: Menopausal symptoms  Plan: Patient remains on HRT for bone protection, vaginal protection, and significant improvement in her joint pain. She understands the slightly high-risk breast cancer. She understands the slightly higher risk of DVT and stroke. I offered her estrogen patch today. For the moment she declined. She will see if she needs to take her HRT daily. She will continue yearly mammograms.

## 2011-04-17 LAB — URINALYSIS W MICROSCOPIC + REFLEX CULTURE
Bacteria, UA: NONE SEEN
Bilirubin Urine: NEGATIVE
Glucose, UA: NEGATIVE mg/dL
Hgb urine dipstick: NEGATIVE
Ketones, ur: NEGATIVE mg/dL
Protein, ur: NEGATIVE mg/dL
Urobilinogen, UA: 0.2 mg/dL (ref 0.0–1.0)

## 2011-04-18 ENCOUNTER — Other Ambulatory Visit: Payer: Self-pay

## 2011-04-18 LAB — URINE CULTURE: Colony Count: 5000

## 2011-08-17 ENCOUNTER — Other Ambulatory Visit (HOSPITAL_COMMUNITY)
Admission: RE | Admit: 2011-08-17 | Discharge: 2011-08-17 | Disposition: A | Discharge status: Home / Self Care | Payer: 59 | Source: Ambulatory Visit | Attending: Obstetrics and Gynecology | Admitting: Obstetrics and Gynecology

## 2011-08-17 ENCOUNTER — Encounter: Payer: Self-pay | Admitting: Women's Health

## 2011-08-17 ENCOUNTER — Ambulatory Visit (INDEPENDENT_AMBULATORY_CARE_PROVIDER_SITE_OTHER): Payer: 59 | Admitting: Women's Health

## 2011-08-17 DIAGNOSIS — IMO0001 Reserved for inherently not codable concepts without codable children: Secondary | ICD-10-CM

## 2011-08-17 DIAGNOSIS — Z01419 Encounter for gynecological examination (general) (routine) without abnormal findings: Secondary | ICD-10-CM | POA: Insufficient documentation

## 2011-08-17 DIAGNOSIS — R87610 Atypical squamous cells of undetermined significance on cytologic smear of cervix (ASC-US): Secondary | ICD-10-CM

## 2011-08-17 NOTE — Progress Notes (Signed)
Patient ID: Catherine [name], female   DOB: 1945/02/20, 66 y.o.   MRN: [id number] Presents for a Pap, history of ascus with negative HR HPV 3/13. History of CIN-1, LEEP. Without complaint today.  Exam: External genitalia within normal limits, speculum exam vaginal walls slightly atrophic, Pap taken. No noted discharge.  Plan: Triage based on Pap results, new screening guidelines reviewed.

## 2011-12-18 ENCOUNTER — Other Ambulatory Visit: Payer: Self-pay

## 2011-12-19 ENCOUNTER — Other Ambulatory Visit: Payer: Self-pay | Admitting: Obstetrics and Gynecology

## 2011-12-19 DIAGNOSIS — Z1231 Encounter for screening mammogram for malignant neoplasm of breast: Secondary | ICD-10-CM

## 2012-01-24 ENCOUNTER — Ambulatory Visit
Admission: RE | Admit: 2012-01-24 | Discharge: 2012-01-24 | Disposition: A | Discharge status: Home / Self Care | Payer: 59 | Source: Ambulatory Visit | Attending: Obstetrics and Gynecology | Admitting: Obstetrics and Gynecology

## 2012-01-24 DIAGNOSIS — Z1231 Encounter for screening mammogram for malignant neoplasm of breast: Secondary | ICD-10-CM

## 2012-04-16 ENCOUNTER — Other Ambulatory Visit: Payer: Self-pay | Admitting: Obstetrics and Gynecology

## 2012-04-16 NOTE — Telephone Encounter (Signed)
Pt will be called to schedule her annual exam. KW 

## 2012-04-18 ENCOUNTER — Encounter: Payer: Self-pay | Admitting: Women's Health

## 2012-04-18 ENCOUNTER — Ambulatory Visit (INDEPENDENT_AMBULATORY_CARE_PROVIDER_SITE_OTHER): Payer: 59 | Admitting: Women's Health

## 2012-04-18 VITALS — BP 92/58 | Ht 62.0 in | Wt 115.0 lb

## 2012-04-18 DIAGNOSIS — N870 Mild cervical dysplasia: Secondary | ICD-10-CM

## 2012-04-18 DIAGNOSIS — Z79890 Hormone replacement therapy (postmenopausal): Secondary | ICD-10-CM

## 2012-04-18 DIAGNOSIS — Z01419 Encounter for gynecological examination (general) (routine) without abnormal findings: Secondary | ICD-10-CM

## 2012-04-18 DIAGNOSIS — M129 Arthropathy, unspecified: Secondary | ICD-10-CM

## 2012-04-18 DIAGNOSIS — M1990 Unspecified osteoarthritis, unspecified site: Secondary | ICD-10-CM

## 2012-04-18 MED ORDER — ESTRADIOL-NORETHINDRONE ACET 0.5-0.1 MG PO TABS: 1.0000 | ORAL_TABLET | Freq: Every day | ORAL | Status: DC

## 2012-04-18 NOTE — Patient Instructions (Signed)

## 2012-04-18 NOTE — Progress Notes (Signed)
Catherine [name] 1945-09-08 [id number]    History:    The patient presents for annual exam.  Postmenopausal with no bleeding on Activella 0.5/0.1. 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 Normal DEXA 2009 T score AP Spine 1.3, left femur -0.3. HSV 1 uses Zovirax rare outbreaks. Normal mammograms. Arthritis, bilateral knee replacements, back fusion. Colonoscopy 2011-diverticulosis, no polyps.   Past medical history, past surgical history, family history and social history were all reviewed and documented in the EPIC chart. 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 2 daughters and 2 grandchildren live in [place]. Parents hypertension, father diabetes.   ROS:  A  ROS was performed and pertinent positives and negatives are included in the history.  Exam:  Filed Vitals:   04/18/12 1612  BP: 92/58    General appearance:  Normal Head/Neck:  Normal, without cervical or supraclavicular adenopathy. Thyroid:  Symmetrical, normal in size, without palpable masses or nodularity. Respiratory  Effort:  Normal  Auscultation:  Clear without wheezing or rhonchi Cardiovascular  Auscultation:  Regular rate, without rubs, murmurs or gallops  Edema/varicosities:  Not grossly evident Abdominal  Soft,nontender, without masses, guarding or rebound.  Liver/spleen:  No organomegaly noted  Hernia:  None appreciated  Skin  Inspection:  Grossly normal  Palpation:  Grossly normal Neurologic/psychiatric  Orientation:  Normal with appropriate conversation.  Mood/affect:  Normal  Genitourinary    Breasts: Examined lying and sitting.     Right: Without masses, retractions, discharge or axillary adenopathy.     Left: Without masses, retractions, discharge or axillary adenopathy.   Inguinal/mons:  Normal without inguinal adenopathy  External genitalia:  Normal  BUS/Urethra/Skene's glands:  Normal  Bladder:  Normal  Vagina:  Normal  Cervix:  Normal  Uterus:   normal in size, shape and  contour.  Midline and mobile  Adnexa/parametria:     Rt: Without masses or tenderness.   Lt: Without masses or tenderness.  Anus and perineum: Normal  Digital rectal exam: Normal sphincter tone without palpated masses or tenderness  Assessment/Plan:  67 y.o. MWF G3P2 for annual exam with no complaints.  Postmenopausal on Activella LEEP for CIN-1 2006, negative HR HPV 2008 Arthritis HSV 1 rare outbreaks Labs primary care Normal DEXA 2009  Plan: Activella 0.5/.1 prescription, proper use, risk for blood clots, strokes, breast cancer reviewed. Reports less joint pain when on HRT. SBE's, continue annual mammogram, calcium rich diet, vitamin D 2000 daily encouraged. Reviewed importance of exercise, will increase as able. DEXA will repeat at [facility] hospital, will schedule. Labs at primary care. Pap, Pap normal 2013.    Harrington Challenger [facility], 5:21 PM 04/18/2012

## 2012-04-21 ENCOUNTER — Other Ambulatory Visit (HOSPITAL_COMMUNITY)
Admission: RE | Admit: 2012-04-21 | Discharge: 2012-04-21 | Disposition: A | Discharge status: Home / Self Care | Payer: 59 | Source: Ambulatory Visit | Attending: Obstetrics and Gynecology | Admitting: Obstetrics and Gynecology

## 2012-04-21 ENCOUNTER — Other Ambulatory Visit (HOSPITAL_COMMUNITY)
Admission: RE | Admit: 2012-04-21 | Discharge: 2012-04-21 | Disposition: A | Discharge status: Home / Self Care | Payer: 59 | Source: Ambulatory Visit | Attending: Women's Health | Admitting: Women's Health

## 2012-04-21 DIAGNOSIS — Z1151 Encounter for screening for human papillomavirus (HPV): Secondary | ICD-10-CM | POA: Insufficient documentation

## 2012-04-21 DIAGNOSIS — Z01419 Encounter for gynecological examination (general) (routine) without abnormal findings: Secondary | ICD-10-CM | POA: Insufficient documentation

## 2012-04-21 NOTE — Addendum Note (Signed)
Addended by: Richardson Chiquito on: 04/21/2012 08:28 AM   Modules accepted: Orders

## 2012-10-14 ENCOUNTER — Other Ambulatory Visit (HOSPITAL_COMMUNITY): Payer: Self-pay | Admitting: Physician Assistant

## 2012-10-14 DIAGNOSIS — M25511 Pain in right shoulder: Secondary | ICD-10-CM

## 2012-10-15 ENCOUNTER — Ambulatory Visit (HOSPITAL_COMMUNITY)
Admission: RE | Admit: 2012-10-15 | Discharge: 2012-10-15 | Disposition: A | Discharge status: Home / Self Care | Payer: 59 | Source: Ambulatory Visit | Attending: Physician Assistant | Admitting: Physician Assistant

## 2012-10-15 DIAGNOSIS — S46819A Strain of other muscles, fascia and tendons at shoulder and upper arm level, unspecified arm, initial encounter: Secondary | ICD-10-CM | POA: Insufficient documentation

## 2012-10-15 DIAGNOSIS — M659 Unspecified synovitis and tenosynovitis, unspecified site: Secondary | ICD-10-CM | POA: Insufficient documentation

## 2012-10-15 DIAGNOSIS — S43499A Other sprain of unspecified shoulder joint, initial encounter: Secondary | ICD-10-CM | POA: Insufficient documentation

## 2012-10-15 DIAGNOSIS — M67919 Unspecified disorder of synovium and tendon, unspecified shoulder: Secondary | ICD-10-CM | POA: Insufficient documentation

## 2012-10-15 DIAGNOSIS — M25419 Effusion, unspecified shoulder: Secondary | ICD-10-CM | POA: Insufficient documentation

## 2012-10-15 DIAGNOSIS — M25511 Pain in right shoulder: Secondary | ICD-10-CM

## 2012-10-15 DIAGNOSIS — X58XXXA Exposure to other specified factors, initial encounter: Secondary | ICD-10-CM | POA: Insufficient documentation

## 2012-10-15 DIAGNOSIS — M719 Bursopathy, unspecified: Secondary | ICD-10-CM | POA: Insufficient documentation

## 2012-10-15 DIAGNOSIS — M19019 Primary osteoarthritis, unspecified shoulder: Secondary | ICD-10-CM | POA: Insufficient documentation

## 2013-02-23 ENCOUNTER — Other Ambulatory Visit: Payer: Self-pay

## 2013-02-23 DIAGNOSIS — Z1231 Encounter for screening mammogram for malignant neoplasm of breast: Secondary | ICD-10-CM

## 2013-03-16 ENCOUNTER — Other Ambulatory Visit: Payer: 59

## 2013-03-16 ENCOUNTER — Ambulatory Visit
Admission: RE | Admit: 2013-03-16 | Discharge: 2013-03-16 | Disposition: A | Discharge status: Home / Self Care | Payer: Self-pay | Source: Ambulatory Visit

## 2013-03-16 DIAGNOSIS — Z1231 Encounter for screening mammogram for malignant neoplasm of breast: Secondary | ICD-10-CM

## 2013-03-30 ENCOUNTER — Ambulatory Visit
Admission: RE | Admit: 2013-03-30 | Discharge: 2013-03-30 | Disposition: A | Discharge status: Home / Self Care | Payer: 59 | Source: Ambulatory Visit | Attending: Women's Health | Admitting: Women's Health

## 2013-03-30 ENCOUNTER — Other Ambulatory Visit: Payer: 59

## 2013-03-30 DIAGNOSIS — Z79890 Hormone replacement therapy (postmenopausal): Secondary | ICD-10-CM

## 2013-04-21 ENCOUNTER — Encounter: Payer: Self-pay | Admitting: Women's Health

## 2013-04-21 ENCOUNTER — Ambulatory Visit (INDEPENDENT_AMBULATORY_CARE_PROVIDER_SITE_OTHER): Payer: 59 | Admitting: Women's Health

## 2013-04-21 VITALS — BP 108/66 | Ht 62.0 in | Wt 115.8 lb

## 2013-04-21 DIAGNOSIS — Z01419 Encounter for gynecological examination (general) (routine) without abnormal findings: Secondary | ICD-10-CM

## 2013-04-21 NOTE — Patient Instructions (Signed)
Health Recommendations for Postmenopausal Women Respected and ongoing research has looked at the most common causes of death, disability, and poor quality of life in postmenopausal women. The causes include heart disease, diseases of blood vessels, diabetes, depression, cancer, and bone loss (osteoporosis). Many things can be done to help lower the chances of developing these and other common problems: CARDIOVASCULAR DISEASE Heart Disease: A heart attack is a medical emergency. Know the signs and symptoms of a heart attack. Below are things women can do to reduce their risk for heart disease.   Do not smoke. If you smoke, quit.  Aim for a healthy weight. Being overweight causes many preventable deaths. Eat a healthy and balanced diet and drink an adequate amount of liquids.  Get moving. Make a commitment to be more physically active. Aim for 30 minutes of activity on most, if not all days of the week.  Eat for heart health. Choose a diet that is low in saturated fat and cholesterol and eliminate trans fat. Include whole grains, vegetables, and fruits. Read and understand the labels on food containers before buying.  Know your numbers. Ask your caregiver to check your blood pressure, cholesterol (total, HDL, LDL, triglycerides) and blood glucose. Work with your caregiver on improving your entire clinical picture.  High blood pressure. Limit or stop your table salt intake (try salt substitute and food seasonings). Avoid salty foods and drinks. Read labels on food containers before buying. Eating well and exercising can help control high blood pressure. STROKE  Stroke is a medical emergency. Stroke may be the result of a blood clot in a blood vessel in the brain or by a brain hemorrhage (bleeding). Know the signs and symptoms of a stroke. To lower the risk of developing a stroke:  Avoid fatty foods.  Quit smoking.  Control your diabetes, blood pressure, and irregular heart rate. THROMBOPHLEBITIS  (BLOOD CLOT) OF THE LEG  Becoming overweight and leading a stationary lifestyle may also contribute to developing blood clots. Controlling your diet and exercising will help lower the risk of developing blood clots. CANCER SCREENING  Breast Cancer: Take steps to reduce your risk of breast cancer.  You should practice "breast self-awareness." This means understanding the normal appearance and feel of your breasts and should include breast self-examination. Any changes detected, no matter how small, should be reported to your caregiver.  After age 40, you should have a clinical breast exam (CBE) every year.  Starting at age 40, you should consider having a mammogram (breast X-ray) every year.  If you have a family history of breast cancer, talk to your caregiver about genetic screening.  If you are at high risk for breast cancer, talk to your caregiver about having an MRI and a mammogram every year.  Intestinal or Stomach Cancer: Tests to consider are a rectal exam, fecal occult blood, sigmoidoscopy, and colonoscopy. Women who are high risk may need to be screened at an earlier age and more often.  Cervical Cancer:  Beginning at age 30, you should have a Pap test every 3 years as long as the past 3 Pap tests have been normal.  If you have had past treatment for cervical cancer or a condition that could lead to cancer, you need Pap tests and screening for cancer for at least 20 years after your treatment.  If you had a hysterectomy for a problem that was not cancer or a condition that could lead to cancer, then you no longer need Pap tests.    If you are between ages 65 and 70, and you have had normal Pap tests going back 10 years, you no longer need Pap tests.  If Pap tests have been discontinued, risk factors (such as a new sexual partner) need to be reassessed to determine if screening should be resumed.  Some medical problems can increase the chance of getting cervical cancer. In these  cases, your caregiver may recommend more frequent screening and Pap tests.  Uterine Cancer: If you have vaginal bleeding after reaching menopause, you should notify your caregiver.  Ovarian cancer: Other than yearly pelvic exams, there are no reliable tests available to screen for ovarian cancer at this time except for yearly pelvic exams.  Lung Cancer: Yearly chest X-rays can detect lung cancer and should be done on high risk women, such as cigarette smokers and women with chronic lung disease (emphysema).  Skin Cancer: A complete body skin exam should be done at your yearly examination. Avoid overexposure to the sun and ultraviolet light lamps. Use a strong sun block cream when in the sun. All of these things are important in lowering the risk of skin cancer. MENOPAUSE Menopause Symptoms: Hormone therapy products are effective for treating symptoms associated with menopause:  Moderate to severe hot flashes.  Night sweats.  Mood swings.  Headaches.  Tiredness.  Loss of sex drive.  Insomnia.  Other symptoms. Hormone replacement carries certain risks, especially in older women. Women who use or are thinking about using estrogen or estrogen with progestin treatments should discuss that with their caregiver. Your caregiver will help you understand the benefits and risks. The ideal dose of hormone replacement therapy is not known. The Food and Drug Administration (FDA) has concluded that hormone therapy should be used only at the lowest doses and for the shortest amount of time to reach treatment goals.  OSTEOPOROSIS Protecting Against Bone Loss and Preventing Fracture: If you use hormone therapy for prevention of bone loss (osteoporosis), the risks for bone loss must outweigh the risk of the therapy. Ask your caregiver about other medications known to be safe and effective for preventing bone loss and fractures. To guard against bone loss or fractures, the following is recommended:  If  you are less than age 50, take 1000 mg of calcium and at least 600 mg of Vitamin D per day.  If you are greater than age 50 but less than age 70, take 1200 mg of calcium and at least 600 mg of Vitamin D per day.  If you are greater than age 70, take 1200 mg of calcium and at least 800 mg of Vitamin D per day. Smoking and excessive alcohol intake increases the risk of osteoporosis. Eat foods rich in calcium and vitamin D and do weight bearing exercises several times a week as your caregiver suggests. DIABETES Diabetes Melitus: If you have Type I or Type 2 diabetes, you should keep your blood sugar under control with diet, exercise and recommended medication. Avoid too many sweets, starchy and fatty foods. Being overweight can make control more difficult. COGNITION AND MEMORY Cognition and Memory: Menopausal hormone therapy is not recommended for the prevention of cognitive disorders such as Alzheimer's disease or memory loss.  DEPRESSION  Depression may occur at any age, but is common in elderly women. The reasons may be because of physical, medical, social (loneliness), or financial problems and needs. If you are experiencing depression because of medical problems and control of symptoms, talk to your caregiver about this. Physical activity and   exercise may help with mood and sleep. Community and volunteer involvement may help your sense of value and worth. If you have depression and you feel that the problem is getting worse or becoming severe, talk to your caregiver about treatment options that are best for you. ACCIDENTS  Accidents are common and can be serious in the elderly woman. Prepare your house to prevent accidents. Eliminate throw rugs, place hand bars in the bath, shower and toilet areas. Avoid wearing high heeled shoes or walking on wet, snowy, and icy areas. Limit or stop driving if you have vision or hearing problems, or you feel you are unsteady with you movements and  reflexes. HEPATITIS C Hepatitis C is a type of viral infection affecting the liver. It is spread mainly through contact with blood from an infected person. It can be treated, but if left untreated, it can lead to severe liver damage over years. Many people who are infected do not know that the virus is in their blood. If you are a "baby-boomer", it is recommended that you have one screening test for Hepatitis C. IMMUNIZATIONS  Several immunizations are important to consider having during your senior years, including:   Tetanus, diptheria, and pertussis booster shot.  Influenza every year before the flu season begins.  Pneumonia vaccine.  Shingles vaccine.  Others as indicated based on your specific needs. Talk to your caregiver about these. Document Released: 03/02/2005 Document Revised: 12/26/2011 Document Reviewed: 10/27/2007 [employer] Patient Information 2014 [employer].  

## 2013-04-21 NOTE — Progress Notes (Signed)
NATALEIGH GRIFFIN 11-27-1945     History:    Presents for annual exam. Postmenopausal on no HRT. 03/2012 Pap ascus with negative HR HPV. 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 A normal mammogram history. 03/2013 DEXA T score spine 1.2, femoral neck -0.2. DEXA showed increase in density. Negative colonoscopy 2011. Problems with arthritis, has had bilateral knee replacement, struggles with neck and back pain. Current on immunizations. Has annual skin checks, history of basal skin cancer.  Past medical history, past surgical history, family history and social history were all reviewed and documented in the EPIC chart. Retired Pharmacist, hospital, numerous Psychologist, occupational activities. Husband neurologist.  ROS:  A  ROS was performed and pertinent positives and negatives are included.  Exam:  Filed Vitals:   04/21/13 1014  BP: 108/66    General appearance:  Normal Thyroid:  Symmetrical, normal in size, without palpable masses or nodularity. Respiratory  Auscultation:  Clear without wheezing or rhonchi Cardiovascular  Auscultation:  Regular rate, without rubs, murmurs or gallops  Edema/varicosities:  Not grossly evident Abdominal  Soft,nontender, without masses, guarding or rebound.  Liver/spleen:  No organomegaly noted  Hernia:  None appreciated  Skin  Inspection:  Grossly normal   Breasts: Examined lying and sitting.     Right: Without masses, retractions, discharge or axillary adenopathy.     Left: Without masses, retractions, discharge or axillary adenopathy. Gentitourinary   Inguinal/mons:  Normal without inguinal adenopathy  External genitalia:  Normal  BUS/Urethra/Skene's glands:  Normal  Vagina:  Normal  Cervix:  Normal  Uterus:   normal in size, shape and contour.  Midline and mobile  Adnexa/parametria:     Rt: Without masses or tenderness.   Lt: Without masses or tenderness.  Anus and perineum: Normal  Digital rectal exam: Normal sphincter tone without palpated masses or  tenderness  Assessment/Plan:  68 y.o. MWF G32 for annual exam with no complaints.  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 Continue exercise as able, calcium rich diet, vitamin D 2000 daily. Labs primary care. Pap ascus with negative HR HPV 2014, new screening guidelines reviewed.   Huel Cote WHNP, 1:11 PM 04/21/2013

## 2013-06-22 ENCOUNTER — Other Ambulatory Visit: Payer: Self-pay | Admitting: Women's Health

## 2013-06-22 NOTE — Telephone Encounter (Signed)
Patient said she never stopped it. I did advise her of the warnings below and she said she and Dr. Darnell Level had a long discussion about his and risks and she has decided to stay with it.

## 2013-06-22 NOTE — Telephone Encounter (Signed)
Refill sent.

## 2013-06-22 NOTE — Telephone Encounter (Signed)
Please call her, I thought she had stopped HRT, she was on last year, if she is still on ok to refill, review risks of blood clots, strokes and breast cancer, she is low risk.

## 2013-11-03 ENCOUNTER — Other Ambulatory Visit: Payer: Self-pay

## 2013-11-17 ENCOUNTER — Telehealth: Payer: Self-pay | Admitting: *Deleted

## 2013-11-17 NOTE — Telephone Encounter (Signed)
Message left

## 2013-11-17 NOTE — Telephone Encounter (Signed)
Pt currently taking Activelle c/o very sore breast, had mammogram in Feb 2015, has dense breast. Pt stopped HRT yesterday, she would like recommendation? Please advise

## 2013-11-18 NOTE — Telephone Encounter (Signed)
Telephone call, reports breast tenderness started with new refill, states refill states same medication as prior Activella 0.5/0.1. Reviewed importance of decreasing, weaning off will try every other day and then stop. Instructed to call if continued problems. Denies any changes with breast exam.

## 2013-11-23 ENCOUNTER — Encounter: Payer: Self-pay | Admitting: Women's Health

## 2014-04-30 ENCOUNTER — Other Ambulatory Visit: Payer: Self-pay

## 2014-04-30 DIAGNOSIS — Z1231 Encounter for screening mammogram for malignant neoplasm of breast: Secondary | ICD-10-CM

## 2014-05-13 ENCOUNTER — Ambulatory Visit
Admission: RE | Admit: 2014-05-13 | Discharge: 2014-05-13 | Disposition: A | Discharge status: Home / Self Care | Payer: 59 | Source: Ambulatory Visit

## 2014-05-13 DIAGNOSIS — Z1231 Encounter for screening mammogram for malignant neoplasm of breast: Secondary | ICD-10-CM

## 2014-07-15 ENCOUNTER — Encounter: Payer: Self-pay | Admitting: Internal Medicine

## 2014-07-16 ENCOUNTER — Ambulatory Visit (INDEPENDENT_AMBULATORY_CARE_PROVIDER_SITE_OTHER): Payer: 59 | Admitting: Women's Health

## 2014-07-16 ENCOUNTER — Other Ambulatory Visit (HOSPITAL_COMMUNITY)
Admission: RE | Admit: 2014-07-16 | Discharge: 2014-07-16 | Disposition: A | Discharge status: Home / Self Care | Payer: 59 | Source: Ambulatory Visit | Attending: Women's Health | Admitting: Women's Health

## 2014-07-16 ENCOUNTER — Encounter: Payer: Self-pay | Admitting: Women's Health

## 2014-07-16 VITALS — BP 118/80 | Ht 62.0 in | Wt 113.0 lb

## 2014-07-16 DIAGNOSIS — Z01411 Encounter for gynecological examination (general) (routine) with abnormal findings: Secondary | ICD-10-CM | POA: Diagnosis present

## 2014-07-16 DIAGNOSIS — Z1151 Encounter for screening for human papillomavirus (HPV): Secondary | ICD-10-CM | POA: Diagnosis present

## 2014-07-16 DIAGNOSIS — Z01419 Encounter for gynecological examination (general) (routine) without abnormal findings: Secondary | ICD-10-CM | POA: Diagnosis not present

## 2014-07-16 DIAGNOSIS — H8110 Benign paroxysmal vertigo, unspecified ear: Secondary | ICD-10-CM | POA: Diagnosis not present

## 2014-07-16 DIAGNOSIS — Z79890 Hormone replacement therapy (postmenopausal): Secondary | ICD-10-CM | POA: Diagnosis not present

## 2014-07-16 DIAGNOSIS — N950 Postmenopausal bleeding: Secondary | ICD-10-CM

## 2014-07-16 MED ORDER — ESTRADIOL-NORETHINDRONE ACET 0.5-0.1 MG PO TABS: 1.0000 | ORAL_TABLET | Freq: Every day | ORAL | Status: DC

## 2014-07-16 MED ORDER — MECLIZINE HCL 50 MG PO TABS: 50.0000 mg | ORAL_TABLET | Freq: Three times a day (TID) | ORAL | Status: DC | PRN

## 2014-07-16 NOTE — Progress Notes (Signed)
Catherine [name] 1945/12/08 [id number]    History:    Presents for annual exam. Spotting x6 days, using 2 pads/day. Mild lower right quadrant discomfort x5 days.  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 2015 DEXA T score spine 1.2, -0.2 femoral neck . Colonoscopy negative 2011. Dizziness symptoms for one year, 3x/month, 5 minutes per spell, feels like on a tilt-a-whirl. Not associated with position, nausea, vomiting, dehydration, stress. Vaccines up to date.   Past medical history, past surgical history, family history and social history were all reviewed and documented in the EPIC chart. Major life stressor, sick 27 year-old father in [place]. Commuting to help with is care. Husband is vascular Psychologist, sport and exercise. Tired Pharmacist, hospital.  ROS:  A ROS was performed and pertinent positives and negatives are included.  Exam:  Filed Vitals:   07/16/14 1441  BP: 118/80    General appearance:  Normal Thyroid:  Symmetrical, normal in size, without palpable masses or nodularity. Respiratory  Auscultation:  Clear without wheezing or rhonchi Cardiovascular  Auscultation:  Regular rate, without rubs, murmurs or gallops  Edema/varicosities:  Not grossly evident Abdominal  Soft,nontender, without masses, guarding or rebound.  Liver/spleen:  No organomegaly noted  Hernia:  None appreciated  Skin  Inspection:  Grossly normal   Breasts: Examined lying and sitting.     Right: Without masses, retractions, discharge or axillary adenopathy.     Left: Without masses, retractions, discharge or axillary adenopathy. Gentitourinary   Inguinal/mons:  Normal without inguinal adenopathy  External genitalia:  Normal  BUS/Urethra/Skene's glands:  Normal  Vagina:  Normal no blood noted, no discharge  Cervix:  Normal  Uterus:  normal in size, shape and contour.  Midline and mobile  Adnexa/parametria:     Rt: Without masses or tenderness.   Lt: Without masses or tenderness.  Anus and perineum: Normal  Digital  rectal exam: Normal sphincter tone without palpated masses or tenderness  Assessment/Plan:  69 y.o. MWF G3P2  for annual exam.  Spotting  Postmenopausal Spotting 6 days /first occurrence Vertigo without syncope Postmenopausal/HRT Pap- 2006 LEEP for CIN-1 margins clear/normal PAPs since Arthritis (Celebrex)  Situational grief/stress  Plan: Schedule Transvaginal US to evaluate spotting, sonohysterogram if needed. HRT reviewed, risks of blood clots, strokes, breast cancer reviewed states feels better on and would like to continue, Activella 0.5/0.1 prescription, proper use given and reviewed, will try to reduce current dose  to 1/2 the dose, she wants to remain on HRT for menopausal symptom, joint/skin relief, and bone protection. Side effects of prolonged HRT/WHI  reviewed.  Antivert 50mg  PRN for dizziness relief. Call or return if symptoms worsen. Home safety, fall prevention and importance of continued exercise. Follow-up with primary care if vertigo continues. PAP, UA.  Huel Cote WHNP, 3:50 PM 07/16/2014

## 2014-07-16 NOTE — Patient Instructions (Signed)
Health Recommendations for Postmenopausal Women Respected and ongoing research has looked at the most common causes of death, disability, and poor quality of life in postmenopausal women. The causes include heart disease, diseases of blood vessels, diabetes, depression, cancer, and bone loss (osteoporosis). Many things can be done to help lower the chances of developing these and other common problems. CARDIOVASCULAR DISEASE Heart Disease: A heart attack is a medical emergency. Know the signs and symptoms of a heart attack. Below are things women can do to reduce their risk for heart disease.   Do not smoke. If you smoke, quit.  Aim for a healthy weight. Being overweight causes many preventable deaths. Eat a healthy and balanced diet and drink an adequate amount of liquids.  Get moving. Make a commitment to be more physically active. Aim for 30 minutes of activity on most, if not all days of the week.  Eat for heart health. Choose a diet that is low in saturated fat and cholesterol and eliminate trans fat. Include whole grains, vegetables, and fruits. Read and understand the labels on food containers before buying.  Know your numbers. Ask your caregiver to check your blood pressure, cholesterol (total, HDL, LDL, triglycerides) and blood glucose. Work with your caregiver on improving your entire clinical picture.  High blood pressure. Limit or stop your table salt intake (try salt substitute and food seasonings). Avoid salty foods and drinks. Read labels on food containers before buying. Eating well and exercising can help control high blood pressure. STROKE  Stroke is a medical emergency. Stroke may be the result of a blood clot in a blood vessel in the brain or by a brain hemorrhage (bleeding). Know the signs and symptoms of a stroke. To lower the risk of developing a stroke:  Avoid fatty foods.  Quit smoking.  Control your diabetes, blood pressure, and irregular heart rate. THROMBOPHLEBITIS  (BLOOD CLOT) OF THE LEG  Becoming overweight and leading a stationary lifestyle may also contribute to developing blood clots. Controlling your diet and exercising will help lower the risk of developing blood clots. CANCER SCREENING  Breast Cancer: Take steps to reduce your risk of breast cancer.  You should practice "breast self-awareness." This means understanding the normal appearance and feel of your breasts and should include breast self-examination. Any changes detected, no matter how small, should be reported to your caregiver.  After age 40, you should have a clinical breast exam (CBE) every year.  Starting at age 40, you should consider having a mammogram (breast X-ray) every year.  If you have a family history of breast cancer, talk to your caregiver about genetic screening.  If you are at high risk for breast cancer, talk to your caregiver about having an MRI and a mammogram every year.  Intestinal or Stomach Cancer: Tests to consider are a rectal exam, fecal occult blood, sigmoidoscopy, and colonoscopy. Women who are high risk may need to be screened at an earlier age and more often.  Cervical Cancer:  Beginning at age 30, you should have a Pap test every 3 years as long as the past 3 Pap tests have been normal.  If you have had past treatment for cervical cancer or a condition that could lead to cancer, you need Pap tests and screening for cancer for at least 20 years after your treatment.  If you had a hysterectomy for a problem that was not cancer or a condition that could lead to cancer, then you no longer need Pap tests.    If you are between ages 65 and 70, and you have had normal Pap tests going back 10 years, you no longer need Pap tests.  If Pap tests have been discontinued, risk factors (such as a new sexual partner) need to be reassessed to determine if screening should be resumed.  Some medical problems can increase the chance of getting cervical cancer. In these  cases, your caregiver may recommend more frequent screening and Pap tests.  Uterine Cancer: If you have vaginal bleeding after reaching menopause, you should notify your caregiver.  Ovarian Cancer: Other than yearly pelvic exams, there are no reliable tests available to screen for ovarian cancer at this time except for yearly pelvic exams.  Lung Cancer: Yearly chest X-rays can detect lung cancer and should be done on high risk women, such as cigarette smokers and women with chronic lung disease (emphysema).  Skin Cancer: A complete body skin exam should be done at your yearly examination. Avoid overexposure to the sun and ultraviolet light lamps. Use a strong sun block cream when in the sun. All of these things are important for lowering the risk of skin cancer. MENOPAUSE Menopause Symptoms: Hormone therapy products are effective for treating symptoms associated with menopause:  Moderate to severe hot flashes.  Night sweats.  Mood swings.  Headaches.  Tiredness.  Loss of sex drive.  Insomnia.  Other symptoms. Hormone replacement carries certain risks, especially in older women. Women who use or are thinking about using estrogen or estrogen with progestin treatments should discuss that with their caregiver. Your caregiver will help you understand the benefits and risks. The ideal dose of hormone replacement therapy is not known. The Food and Drug Administration (FDA) has concluded that hormone therapy should be used only at the lowest doses and for the shortest amount of time to reach treatment goals.  OSTEOPOROSIS Protecting Against Bone Loss and Preventing Fracture If you use hormone therapy for prevention of bone loss (osteoporosis), the risks for bone loss must outweigh the risk of the therapy. Ask your caregiver about other medications known to be safe and effective for preventing bone loss and fractures. To guard against bone loss or fractures, the following is recommended:  If  you are younger than age 50, take 1000 mg of calcium and at least 600 mg of Vitamin D per day.  If you are older than age 50 but younger than age 70, take 1200 mg of calcium and at least 600 mg of Vitamin D per day.  If you are older than age 70, take 1200 mg of calcium and at least 800 mg of Vitamin D per day. Smoking and excessive alcohol intake increases the risk of osteoporosis. Eat foods rich in calcium and vitamin D and do weight bearing exercises several times a week as your caregiver suggests. DIABETES Diabetes Mellitus: If you have type I or type 2 diabetes, you should keep your blood sugar under control with diet, exercise, and recommended medication. Avoid starchy and fatty foods, and too many sweets. Being overweight can make diabetes control more difficult. COGNITION AND MEMORY Cognition and Memory: Menopausal hormone therapy is not recommended for the prevention of cognitive disorders such as Alzheimer's disease or memory loss.  DEPRESSION  Depression may occur at any age, but it is common in elderly women. This may be because of physical, medical, social (loneliness), or financial problems and needs. If you are experiencing depression because of medical problems and control of symptoms, talk to your caregiver about this. Physical   activity and exercise may help with mood and sleep. Community and volunteer involvement may improve your sense of value and worth. If you have depression and you feel that the problem is getting worse or becoming severe, talk to your caregiver about which treatment options are best for you. ACCIDENTS  Accidents are common and can be serious in elderly woman. Prepare your house to prevent accidents. Eliminate throw rugs, place hand bars in bath, shower, and toilet areas. Avoid wearing high heeled shoes or walking on wet, snowy, and icy areas. Limit or stop driving if you have vision or hearing problems, or if you feel you are unsteady with your movements and  reflexes. HEPATITIS C Hepatitis C is a type of viral infection affecting the liver. It is spread mainly through contact with blood from an infected person. It can be treated, but if left untreated, it can lead to severe liver damage over the years. Many people who are infected do not know that the virus is in their blood. If you are a "baby-boomer", it is recommended that you have one screening test for Hepatitis C. IMMUNIZATIONS  Several immunizations are important to consider having during your senior years, including:   Tetanus, diphtheria, and pertussis booster shot.  Influenza every year before the flu season begins.  Pneumonia vaccine.  Shingles vaccine.  Others, as indicated based on your specific needs. Talk to your caregiver about these. Document Released: 03/02/2005 Document Revised: 05/25/2013 Document Reviewed: 10/27/2007 [employer] Patient Information 2015 [employer]. This information is not intended to replace advice given to you by your health care provider. Make sure you discuss any questions you have with your health care provider.  

## 2014-07-17 LAB — URINALYSIS W MICROSCOPIC + REFLEX CULTURE
BACTERIA UA: NONE SEEN
Bilirubin Urine: NEGATIVE
Casts: NONE SEEN
Crystals: NONE SEEN
Glucose, UA: NEGATIVE mg/dL
Ketones, ur: NEGATIVE mg/dL
Nitrite: NEGATIVE
PH: 6.5 (ref 5.0–8.0)
Protein, ur: NEGATIVE mg/dL
Specific Gravity, Urine: 1.01 (ref 1.005–1.030)
UROBILINOGEN UA: 0.2 mg/dL (ref 0.0–1.0)

## 2014-07-18 LAB — URINE CULTURE: Colony Count: 50000

## 2014-07-19 ENCOUNTER — Other Ambulatory Visit: Payer: Self-pay

## 2014-07-19 ENCOUNTER — Other Ambulatory Visit: Payer: Self-pay | Admitting: Women's Health

## 2014-07-19 MED ORDER — FLUCONAZOLE 150 MG PO TABS: 150.0000 mg | ORAL_TABLET | Freq: Once | ORAL | Status: DC

## 2014-07-20 LAB — CYTOLOGY - PAP

## 2014-07-28 ENCOUNTER — Ambulatory Visit (INDEPENDENT_AMBULATORY_CARE_PROVIDER_SITE_OTHER): Payer: 59 | Admitting: Women's Health

## 2014-07-28 ENCOUNTER — Ambulatory Visit (INDEPENDENT_AMBULATORY_CARE_PROVIDER_SITE_OTHER): Payer: 59

## 2014-07-28 ENCOUNTER — Other Ambulatory Visit: Payer: Self-pay | Admitting: Women's Health

## 2014-07-28 ENCOUNTER — Encounter: Payer: Self-pay | Admitting: Women's Health

## 2014-07-28 VITALS — BP 128/80 | Ht 62.0 in | Wt 113.0 lb

## 2014-07-28 DIAGNOSIS — N950 Postmenopausal bleeding: Secondary | ICD-10-CM | POA: Diagnosis not present

## 2014-07-28 DIAGNOSIS — N949 Unspecified condition associated with female genital organs and menstrual cycle: Secondary | ICD-10-CM | POA: Diagnosis not present

## 2014-07-28 DIAGNOSIS — D251 Intramural leiomyoma of uterus: Secondary | ICD-10-CM

## 2014-07-28 DIAGNOSIS — N8320 Unspecified ovarian cysts: Secondary | ICD-10-CM

## 2014-07-28 DIAGNOSIS — N83202 Unspecified ovarian cyst, left side: Secondary | ICD-10-CM

## 2014-07-29 ENCOUNTER — Other Ambulatory Visit: Payer: Self-pay | Admitting: Women's Health

## 2014-07-29 DIAGNOSIS — N83202 Unspecified ovarian cyst, left side: Secondary | ICD-10-CM

## 2014-07-29 LAB — CA 125: CA 125: 8 U/mL (ref <35)

## 2014-07-29 NOTE — Progress Notes (Signed)
Patient ID: Catherine [name], female   DOB: [birth year], 69 y.o.   MRN: [id number] Presents for ultrasound. Had right lower quadrant intermittent mild pain, also had 6 days of light spotting during a stressful time helping to care for 36 year old father 2 weeks ago. No spotting since. On Activella 0.5/0.1, states has helped arthritis when trying to come off does have increased arthritic pain.  Exam: Appears well. Ultrasound: T/V and T/A anteverted uterus with posterior intramural fibroid 24 x 16 x 21 mm. Endometrium thin at 2.2 mm. Echogenic. Right ovary atrophic normal echo. Left ovary atrophic, normal echo. Left paraovarian cyst 9 x 13 x 12 mm echo-free with negative CFD. Negative cul-de-sac.  Thin endometrium on HRT  Plan: HRT again reviewed risks of blood clots, strokes, breast cancer, WHI study reviewed. Will continue and try to decrease has had increased joint pain when decreasing dose has no family history of breast cancer or heart disease. Instructed to call if any further bleeding or if right lower quadrant pain increases or changes. Reviewed sonohysterogram if further bleeding.

## 2014-08-05 ENCOUNTER — Other Ambulatory Visit (HOSPITAL_COMMUNITY): Payer: Self-pay | Admitting: Neurological Surgery

## 2014-08-05 DIAGNOSIS — M545 Low back pain, unspecified: Secondary | ICD-10-CM

## 2014-08-06 ENCOUNTER — Ambulatory Visit (HOSPITAL_COMMUNITY)
Admission: RE | Admit: 2014-08-06 | Discharge: 2014-08-06 | Disposition: A | Discharge status: Home / Self Care | Payer: 59 | Source: Ambulatory Visit | Attending: Neurological Surgery | Admitting: Neurological Surgery

## 2014-08-06 DIAGNOSIS — M5136 Other intervertebral disc degeneration, lumbar region: Secondary | ICD-10-CM | POA: Diagnosis not present

## 2014-08-06 DIAGNOSIS — M545 Low back pain, unspecified: Secondary | ICD-10-CM

## 2014-08-06 DIAGNOSIS — M4807 Spinal stenosis, lumbosacral region: Secondary | ICD-10-CM | POA: Diagnosis not present

## 2014-08-06 DIAGNOSIS — M5126 Other intervertebral disc displacement, lumbar region: Secondary | ICD-10-CM | POA: Diagnosis not present

## 2014-08-06 LAB — POCT I-STAT CREATININE: Creatinine, Ser: 0.7 mg/dL (ref 0.44–1.00)

## 2014-08-06 MED ORDER — GADOBENATE DIMEGLUMINE 529 MG/ML IV SOLN
10.0000 mL | Freq: Once | INTRAVENOUS | Status: AC | PRN
  Administered 2014-08-06: 10 mL via INTRAVENOUS

## 2014-08-09 ENCOUNTER — Encounter (HOSPITAL_COMMUNITY): Payer: Self-pay | Admitting: *Deleted

## 2014-08-09 ENCOUNTER — Other Ambulatory Visit: Payer: Self-pay | Admitting: Neurological Surgery

## 2014-08-09 MED ORDER — MUPIROCIN 2 % EX OINT: 1.0000 "application " | TOPICAL_OINTMENT | Freq: Once | CUTANEOUS | Status: DC

## 2014-08-09 MED ORDER — CEFAZOLIN SODIUM-DEXTROSE 2-3 GM-% IV SOLR
2.0000 g | INTRAVENOUS | Status: AC
  Administered 2014-08-10: 2 g via INTRAVENOUS
  Filled 2014-08-09: qty 50

## 2014-08-09 NOTE — Progress Notes (Signed)
Pt denies SOB, chest pain, and being under the care of a cardiologist. Pt denies having a stress test, echo and cardiac cath. Pt denies having an EKG and chest x ray within the last year. Pt made aware to stop taking Aspirin, otc vitamins, and herbal medications such as fish oil. Do not take any NSAIDs ie: Ibuprofen, Advil, Naproxen or any medication containing Aspirin such as Celebrex. Pt verbalized understanding of all pre-op instructions.

## 2014-08-10 ENCOUNTER — Ambulatory Visit (HOSPITAL_COMMUNITY): Payer: 59 | Admitting: Anesthesiology

## 2014-08-10 ENCOUNTER — Observation Stay (HOSPITAL_COMMUNITY)
Admission: RE | Admit: 2014-08-10 | Discharge: 2014-08-11 | Disposition: A | Discharge status: Home / Self Care | Payer: 59 | Source: Ambulatory Visit | Attending: Neurological Surgery | Admitting: Neurological Surgery

## 2014-08-10 ENCOUNTER — Ambulatory Visit (HOSPITAL_COMMUNITY): Payer: 59

## 2014-08-10 ENCOUNTER — Encounter (HOSPITAL_COMMUNITY)
Admission: RE | Disposition: A | Discharge status: Home / Self Care | Payer: Self-pay | Source: Ambulatory Visit | Attending: Neurological Surgery

## 2014-08-10 ENCOUNTER — Encounter (HOSPITAL_COMMUNITY): Payer: Self-pay | Admitting: Anesthesiology

## 2014-08-10 DIAGNOSIS — M5127 Other intervertebral disc displacement, lumbosacral region: Secondary | ICD-10-CM

## 2014-08-10 DIAGNOSIS — M5117 Intervertebral disc disorders with radiculopathy, lumbosacral region: Secondary | ICD-10-CM | POA: Diagnosis present

## 2014-08-10 DIAGNOSIS — Z419 Encounter for procedure for purposes other than remedying health state, unspecified: Secondary | ICD-10-CM

## 2014-08-10 HISTORY — PX: LUMBAR LAMINECTOMY/DECOMPRESSION MICRODISCECTOMY: SHX5026

## 2014-08-10 HISTORY — DX: Failed or difficult intubation, initial encounter: T88.4XXA

## 2014-08-10 HISTORY — DX: Other intervertebral disc displacement, lumbosacral region: M51.27

## 2014-08-10 HISTORY — DX: Insomnia, unspecified: G47.00

## 2014-08-10 HISTORY — DX: Unspecified osteoarthritis, unspecified site: M19.90

## 2014-08-10 LAB — CBC
HEMATOCRIT: 39.5 % (ref 36.0–46.0)
Hemoglobin: 13.9 g/dL (ref 12.0–15.0)
MCH: 32 pg (ref 26.0–34.0)
MCHC: 35.2 g/dL (ref 30.0–36.0)
MCV: 90.8 fL (ref 78.0–100.0)
Platelets: 292 10*3/uL (ref 150–400)
RBC: 4.35 MIL/uL (ref 3.87–5.11)
RDW: 12.9 % (ref 11.5–15.5)
WBC: 10.1 10*3/uL (ref 4.0–10.5)

## 2014-08-10 LAB — BASIC METABOLIC PANEL
Anion gap: 9 (ref 5–15)
BUN: 18 mg/dL (ref 6–20)
CO2: 24 mmol/L (ref 22–32)
CREATININE: 0.71 mg/dL (ref 0.44–1.00)
Calcium: 8.7 mg/dL — ABNORMAL LOW (ref 8.9–10.3)
Chloride: 103 mmol/L (ref 101–111)
GFR calc Af Amer: >60 mL/min (ref >60)
GFR calc non Af Amer: >60 mL/min (ref >60)
Glucose, Bld: 91 mg/dL (ref 65–99)
POTASSIUM: 3.7 mmol/L (ref 3.5–5.1)
Sodium: 136 mmol/L (ref 135–145)

## 2014-08-10 LAB — SURGICAL PCR SCREEN
MRSA, PCR: NEGATIVE
STAPHYLOCOCCUS AUREUS: NEGATIVE

## 2014-08-10 SURGERY — LUMBAR LAMINECTOMY/DECOMPRESSION MICRODISCECTOMY 1 LEVEL
Anesthesia: General | Site: Back | Laterality: Bilateral

## 2014-08-10 MED ORDER — LIDOCAINE-EPINEPHRINE 1 %-1:100000 IJ SOLN
INTRAMUSCULAR | Status: DC | PRN
  Administered 2014-08-10: 3 mL

## 2014-08-10 MED ORDER — DOCUSATE SODIUM 100 MG PO CAPS
100.0000 mg | ORAL_CAPSULE | Freq: Two times a day (BID) | ORAL | Status: DC
  Administered 2014-08-10: 100 mg via ORAL
  Filled 2014-08-10: qty 1

## 2014-08-10 MED ORDER — SODIUM CHLORIDE 0.9 % IJ SOLN: 3.0000 mL | INTRAMUSCULAR | Status: DC | PRN

## 2014-08-10 MED ORDER — HYDROCODONE-ACETAMINOPHEN 5-325 MG PO TABS: 1.0000 | ORAL_TABLET | Freq: Four times a day (QID) | ORAL | Status: DC | PRN

## 2014-08-10 MED ORDER — MUPIROCIN 2 % EX OINT
1.0000 "application " | TOPICAL_OINTMENT | Freq: Once | CUTANEOUS | Status: AC
  Administered 2014-08-10: 1 via TOPICAL
  Filled 2014-08-10: qty 22

## 2014-08-10 MED ORDER — ONDANSETRON HCL 4 MG/2ML IJ SOLN: 4.0000 mg | INTRAMUSCULAR | Status: DC | PRN

## 2014-08-10 MED ORDER — THROMBIN 5000 UNITS EX SOLR
CUTANEOUS | Status: DC | PRN
  Administered 2014-08-10 (×2): 5000 [IU] via TOPICAL

## 2014-08-10 MED ORDER — SENNA 8.6 MG PO TABS
1.0000 | ORAL_TABLET | Freq: Two times a day (BID) | ORAL | Status: DC
  Administered 2014-08-10: 8.6 mg via ORAL
  Filled 2014-08-10 (×3): qty 1

## 2014-08-10 MED ORDER — LACTATED RINGERS IV SOLN
INTRAVENOUS | Status: DC | PRN
  Administered 2014-08-10 (×2): via INTRAVENOUS

## 2014-08-10 MED ORDER — PHENYLEPHRINE 40 MCG/ML (10ML) SYRINGE FOR IV PUSH (FOR BLOOD PRESSURE SUPPORT)
PREFILLED_SYRINGE | INTRAVENOUS | Status: AC
  Filled 2014-08-10: qty 10

## 2014-08-10 MED ORDER — KETOROLAC TROMETHAMINE 30 MG/ML IJ SOLN
30.0000 mg | Freq: Once | INTRAMUSCULAR | Status: AC | PRN
  Administered 2014-08-10: 30 mg via INTRAVENOUS

## 2014-08-10 MED ORDER — FENTANYL CITRATE (PF) 250 MCG/5ML IJ SOLN
INTRAMUSCULAR | Status: AC
  Filled 2014-08-10: qty 5

## 2014-08-10 MED ORDER — GLYCOPYRROLATE 0.2 MG/ML IJ SOLN
INTRAMUSCULAR | Status: AC
  Filled 2014-08-10: qty 3

## 2014-08-10 MED ORDER — NEOSTIGMINE METHYLSULFATE 10 MG/10ML IV SOLN
INTRAVENOUS | Status: DC | PRN
  Administered 2014-08-10: 3 mg via INTRAVENOUS

## 2014-08-10 MED ORDER — LIDOCAINE HCL (CARDIAC) 20 MG/ML IV SOLN
INTRAVENOUS | Status: AC
  Filled 2014-08-10: qty 5

## 2014-08-10 MED ORDER — ACETAMINOPHEN 650 MG RE SUPP: 650.0000 mg | RECTAL | Status: DC | PRN

## 2014-08-10 MED ORDER — METHOCARBAMOL 1000 MG/10ML IJ SOLN
500.0000 mg | Freq: Four times a day (QID) | INTRAVENOUS | Status: DC | PRN
  Filled 2014-08-10: qty 5

## 2014-08-10 MED ORDER — KETOROLAC TROMETHAMINE 15 MG/ML IJ SOLN
15.0000 mg | Freq: Four times a day (QID) | INTRAMUSCULAR | Status: DC
  Administered 2014-08-11 (×2): 15 mg via INTRAVENOUS
  Filled 2014-08-10 (×3): qty 1

## 2014-08-10 MED ORDER — ROCURONIUM BROMIDE 100 MG/10ML IV SOLN
INTRAVENOUS | Status: DC | PRN
  Administered 2014-08-10: 30 mg via INTRAVENOUS

## 2014-08-10 MED ORDER — ROCURONIUM BROMIDE 50 MG/5ML IV SOLN
INTRAVENOUS | Status: AC
  Filled 2014-08-10: qty 1

## 2014-08-10 MED ORDER — PROPOFOL 10 MG/ML IV BOLUS
INTRAVENOUS | Status: DC | PRN
  Administered 2014-08-10: 110 mg via INTRAVENOUS

## 2014-08-10 MED ORDER — LACTATED RINGERS IV SOLN
INTRAVENOUS | Status: DC
  Administered 2014-08-10: 50 mL/h via INTRAVENOUS

## 2014-08-10 MED ORDER — ONDANSETRON HCL 4 MG/2ML IJ SOLN
INTRAMUSCULAR | Status: AC
  Filled 2014-08-10: qty 2

## 2014-08-10 MED ORDER — FENTANYL CITRATE (PF) 100 MCG/2ML IJ SOLN
INTRAMUSCULAR | Status: DC | PRN
  Administered 2014-08-10: 100 ug via INTRAVENOUS

## 2014-08-10 MED ORDER — SODIUM CHLORIDE 0.9 % IJ SOLN: 3.0000 mL | Freq: Two times a day (BID) | INTRAMUSCULAR | Status: DC

## 2014-08-10 MED ORDER — EPHEDRINE SULFATE 50 MG/ML IJ SOLN
INTRAMUSCULAR | Status: AC
  Filled 2014-08-10: qty 1

## 2014-08-10 MED ORDER — LIDOCAINE HCL (CARDIAC) 20 MG/ML IV SOLN
INTRAVENOUS | Status: DC | PRN
  Administered 2014-08-10: 50 mg via INTRAVENOUS

## 2014-08-10 MED ORDER — ONDANSETRON HCL 4 MG/2ML IJ SOLN
INTRAMUSCULAR | Status: DC | PRN
  Administered 2014-08-10: 4 mg via INTRAVENOUS

## 2014-08-10 MED ORDER — 0.9 % SODIUM CHLORIDE (POUR BTL) OPTIME
TOPICAL | Status: DC | PRN
  Administered 2014-08-10: 1000 mL

## 2014-08-10 MED ORDER — ARTIFICIAL TEARS OP OINT
TOPICAL_OINTMENT | OPHTHALMIC | Status: DC | PRN
  Administered 2014-08-10: 1 via OPHTHALMIC

## 2014-08-10 MED ORDER — ALUM & MAG HYDROXIDE-SIMETH 200-200-20 MG/5ML PO SUSP: 30.0000 mL | Freq: Four times a day (QID) | ORAL | Status: DC | PRN

## 2014-08-10 MED ORDER — MENTHOL 3 MG MT LOZG: 1.0000 | LOZENGE | OROMUCOSAL | Status: DC | PRN

## 2014-08-10 MED ORDER — ZOLPIDEM TARTRATE 5 MG PO TABS
5.0000 mg | ORAL_TABLET | Freq: Every day | ORAL | Status: DC
  Administered 2014-08-10: 5 mg via ORAL
  Filled 2014-08-10: qty 1

## 2014-08-10 MED ORDER — BISACODYL 10 MG RE SUPP: 10.0000 mg | Freq: Every day | RECTAL | Status: DC | PRN

## 2014-08-10 MED ORDER — BUPIVACAINE HCL (PF) 0.5 % IJ SOLN
INTRAMUSCULAR | Status: DC | PRN
  Administered 2014-08-10: 3 mL
  Administered 2014-08-10: 10 mL

## 2014-08-10 MED ORDER — MIDAZOLAM HCL 2 MG/2ML IJ SOLN
INTRAMUSCULAR | Status: AC
  Filled 2014-08-10: qty 2

## 2014-08-10 MED ORDER — HYDROMORPHONE HCL 1 MG/ML IJ SOLN
0.2500 mg | INTRAMUSCULAR | Status: DC | PRN
  Administered 2014-08-10 (×2): 0.5 mg via INTRAVENOUS

## 2014-08-10 MED ORDER — KETOROLAC TROMETHAMINE 30 MG/ML IJ SOLN
INTRAMUSCULAR | Status: AC
  Filled 2014-08-10: qty 1

## 2014-08-10 MED ORDER — OXYCODONE-ACETAMINOPHEN 5-325 MG PO TABS: 1.0000 | ORAL_TABLET | ORAL | Status: DC | PRN

## 2014-08-10 MED ORDER — PROPOFOL 10 MG/ML IV BOLUS
INTRAVENOUS | Status: AC
  Filled 2014-08-10: qty 20

## 2014-08-10 MED ORDER — PHENOL 1.4 % MT LIQD: 1.0000 | OROMUCOSAL | Status: DC | PRN

## 2014-08-10 MED ORDER — ACETAMINOPHEN 325 MG PO TABS: 650.0000 mg | ORAL_TABLET | ORAL | Status: DC | PRN

## 2014-08-10 MED ORDER — DEXAMETHASONE SODIUM PHOSPHATE 10 MG/ML IJ SOLN
INTRAMUSCULAR | Status: DC | PRN
  Administered 2014-08-10: 10 mg via INTRAVENOUS

## 2014-08-10 MED ORDER — PROMETHAZINE HCL 25 MG/ML IJ SOLN: 6.2500 mg | INTRAMUSCULAR | Status: DC | PRN

## 2014-08-10 MED ORDER — DIAZEPAM 5 MG PO TABS
5.0000 mg | ORAL_TABLET | Freq: Four times a day (QID) | ORAL | Status: DC | PRN
  Administered 2014-08-10 – 2014-08-11 (×2): 5 mg via ORAL
  Filled 2014-08-10 (×2): qty 1

## 2014-08-10 MED ORDER — MIDAZOLAM HCL 5 MG/5ML IJ SOLN
INTRAMUSCULAR | Status: DC | PRN
  Administered 2014-08-10: 2 mg via INTRAVENOUS

## 2014-08-10 MED ORDER — GLYCOPYRROLATE 0.2 MG/ML IJ SOLN
INTRAMUSCULAR | Status: DC | PRN
  Administered 2014-08-10: .6 mg via INTRAVENOUS

## 2014-08-10 MED ORDER — MORPHINE SULFATE 2 MG/ML IJ SOLN
1.0000 mg | INTRAMUSCULAR | Status: DC | PRN
  Administered 2014-08-10: 4 mg via INTRAVENOUS
  Filled 2014-08-10 (×2): qty 1

## 2014-08-10 MED ORDER — DEXAMETHASONE SODIUM PHOSPHATE 10 MG/ML IJ SOLN
INTRAMUSCULAR | Status: AC
  Filled 2014-08-10: qty 1

## 2014-08-10 MED ORDER — SODIUM CHLORIDE 0.9 % IR SOLN
Status: DC | PRN
  Administered 2014-08-10: 500 mL

## 2014-08-10 MED ORDER — METHOCARBAMOL 500 MG PO TABS: 500.0000 mg | ORAL_TABLET | Freq: Four times a day (QID) | ORAL | Status: DC | PRN

## 2014-08-10 MED ORDER — NEOSTIGMINE METHYLSULFATE 10 MG/10ML IV SOLN
INTRAVENOUS | Status: AC
  Filled 2014-08-10: qty 1

## 2014-08-10 MED ORDER — HEMOSTATIC AGENTS (NO CHARGE) OPTIME
TOPICAL | Status: DC | PRN
  Administered 2014-08-10: 1 via TOPICAL

## 2014-08-10 MED ORDER — THROMBIN 5000 UNITS EX SOLR
OROMUCOSAL | Status: DC | PRN
  Administered 2014-08-10: 5 mL via TOPICAL

## 2014-08-10 MED ORDER — SODIUM CHLORIDE 0.9 % IV SOLN: 250.0000 mL | INTRAVENOUS | Status: DC

## 2014-08-10 MED ORDER — POLYETHYLENE GLYCOL 3350 17 G PO PACK
17.0000 g | PACK | Freq: Every day | ORAL | Status: DC | PRN
  Filled 2014-08-10: qty 1

## 2014-08-10 MED ORDER — HYDROCODONE-ACETAMINOPHEN 5-325 MG PO TABS
1.0000 | ORAL_TABLET | ORAL | Status: DC | PRN
  Administered 2014-08-11 (×2): 2 via ORAL
  Filled 2014-08-10 (×2): qty 2

## 2014-08-10 MED ORDER — HYDROMORPHONE HCL 1 MG/ML IJ SOLN
INTRAMUSCULAR | Status: AC
  Filled 2014-08-10: qty 1

## 2014-08-10 SURGICAL SUPPLY — 52 items
ADH SKN CLS APL DERMABOND .7 (GAUZE/BANDAGES/DRESSINGS)
ADH SKN CLS LQ APL DERMABOND (GAUZE/BANDAGES/DRESSINGS) ×1
BAG DECANTER FOR FLEXI CONT (MISCELLANEOUS) ×2 IMPLANT
BLADE CLIPPER SURG (BLADE) IMPLANT
BUR ACORN 6.0 (BURR) IMPLANT
BUR MATCHSTICK NEURO 3.0 LAGG (BURR) ×2 IMPLANT
CANISTER SUCT 3000ML PPV (MISCELLANEOUS) ×2 IMPLANT
CONT SPEC 4OZ CLIKSEAL STRL BL (MISCELLANEOUS) ×2 IMPLANT
DECANTER SPIKE VIAL GLASS SM (MISCELLANEOUS) ×2 IMPLANT
DERMABOND ADHESIVE PROPEN (GAUZE/BANDAGES/DRESSINGS) ×1
DERMABOND ADVANCED (GAUZE/BANDAGES/DRESSINGS)
DERMABOND ADVANCED .7 DNX12 (GAUZE/BANDAGES/DRESSINGS) ×1 IMPLANT
DERMABOND ADVANCED .7 DNX6 (GAUZE/BANDAGES/DRESSINGS) IMPLANT
DRAPE LAPAROTOMY T 102X78X121 (DRAPES) ×2 IMPLANT
DRAPE MICROSCOPE LEICA (MISCELLANEOUS) ×1 IMPLANT
DRAPE POUCH INSTRU U-SHP 10X18 (DRAPES) ×2 IMPLANT
DRAPE PROXIMA HALF (DRAPES) IMPLANT
DURAPREP 26ML APPLICATOR (WOUND CARE) ×2 IMPLANT
ELECT REM PT RETURN 9FT ADLT (ELECTROSURGICAL) ×2
ELECTRODE REM PT RTRN 9FT ADLT (ELECTROSURGICAL) ×1 IMPLANT
GAUZE SPONGE 4X4 12PLY STRL (GAUZE/BANDAGES/DRESSINGS) ×2 IMPLANT
GAUZE SPONGE 4X4 16PLY XRAY LF (GAUZE/BANDAGES/DRESSINGS) IMPLANT
GLOVE BIO SURGEON STRL SZ 6.5 (GLOVE) ×1 IMPLANT
GLOVE BIOGEL PI IND STRL 6.5 (GLOVE) IMPLANT
GLOVE BIOGEL PI IND STRL 8.5 (GLOVE) ×1 IMPLANT
GLOVE BIOGEL PI INDICATOR 6.5 (GLOVE) ×1
GLOVE BIOGEL PI INDICATOR 8.5 (GLOVE) ×1
GLOVE ECLIPSE 8.5 STRL (GLOVE) ×2 IMPLANT
GOWN STRL REUS W/ TWL LRG LVL3 (GOWN DISPOSABLE) IMPLANT
GOWN STRL REUS W/ TWL XL LVL3 (GOWN DISPOSABLE) IMPLANT
GOWN STRL REUS W/TWL 2XL LVL3 (GOWN DISPOSABLE) ×2 IMPLANT
GOWN STRL REUS W/TWL LRG LVL3 (GOWN DISPOSABLE) ×2
GOWN STRL REUS W/TWL XL LVL3 (GOWN DISPOSABLE)
KIT BASIN OR (CUSTOM PROCEDURE TRAY) ×2 IMPLANT
KIT ROOM TURNOVER OR (KITS) ×2 IMPLANT
NDL SPNL 20GX3.5 QUINCKE YW (NEEDLE) IMPLANT
NEEDLE HYPO 22GX1.5 SAFETY (NEEDLE) ×2 IMPLANT
NEEDLE SPNL 20GX3.5 QUINCKE YW (NEEDLE) IMPLANT
NS IRRIG 1000ML POUR BTL (IV SOLUTION) ×2 IMPLANT
PACK LAMINECTOMY NEURO (CUSTOM PROCEDURE TRAY) ×2 IMPLANT
PAD ARMBOARD 7.5X6 YLW CONV (MISCELLANEOUS) ×6 IMPLANT
PATTIES SURGICAL .5 X1 (DISPOSABLE) ×1 IMPLANT
RUBBERBAND STERILE (MISCELLANEOUS) IMPLANT
SPONGE SURGIFOAM ABS GEL SZ50 (HEMOSTASIS) ×2 IMPLANT
SUT VIC AB 1 CT1 18XBRD ANBCTR (SUTURE) ×1 IMPLANT
SUT VIC AB 1 CT1 8-18 (SUTURE) ×2
SUT VIC AB 2-0 CP2 18 (SUTURE) ×2 IMPLANT
SUT VIC AB 3-0 SH 8-18 (SUTURE) ×2 IMPLANT
SYR 20ML ECCENTRIC (SYRINGE) ×2 IMPLANT
TOWEL OR 17X24 6PK STRL BLUE (TOWEL DISPOSABLE) ×2 IMPLANT
TOWEL OR 17X26 10 PK STRL BLUE (TOWEL DISPOSABLE) ×2 IMPLANT
WATER STERILE IRR 1000ML POUR (IV SOLUTION) ×2 IMPLANT

## 2014-08-10 NOTE — Anesthesia Preprocedure Evaluation (Addendum)
Anesthesia Evaluation  Patient identified by MRN, date of birth, ID band Patient awake    Reviewed: Allergy & Precautions, NPO status , Patient's Chart, lab work & pertinent test results  Airway Mallampati: II  TM Distance: >3 FB Neck ROM: Full    Dental no notable dental hx.    Pulmonary neg pulmonary ROS,  breath sounds clear to auscultation  Pulmonary exam normal       Cardiovascular negative cardio ROS Normal cardiovascular examRhythm:Regular Rate:Normal     Neuro/Psych negative neurological ROS  negative psych ROS   GI/Hepatic negative GI ROS, Neg liver ROS,   Endo/Other  negative endocrine ROS  Renal/GU negative Renal ROS  negative genitourinary   Musculoskeletal negative musculoskeletal ROS (+)   Abdominal   Peds negative pediatric ROS (+)  Hematology negative hematology ROS (+)   Anesthesia Other Findings   Reproductive/Obstetrics negative OB ROS                             Anesthesia Physical Anesthesia Plan  ASA: I  Anesthesia Plan: General   Post-op Pain Management:    Induction: Intravenous  Airway Management Planned: Oral ETT  Additional Equipment:   Intra-op Plan:   Post-operative Plan: Extubation in OR  Informed Consent: I have reviewed the patients History and Physical, chart, labs and discussed the procedure including the risks, benefits and alternatives for the proposed anesthesia with the patient or authorized representative who has indicated his/her understanding and acceptance.   Dental advisory given  Plan Discussed with: CRNA and Surgeon  Anesthesia Plan Comments: (Needs small ETT. Will intubate with 6.0)        Anesthesia Quick Evaluation

## 2014-08-10 NOTE — Transfer of Care (Signed)
Immediate Anesthesia Transfer of Care Note  Patient: Catherine [name]  Procedure(s) Performed: Procedure(s) with comments: Bilateral Lumbar five-Sacral one Diskectomy (Bilateral) - Bilateral L5-S1 Diskectomy  Patient Location: PACU  Anesthesia Type:General  Level of Consciousness: awake, alert  and oriented  Airway & Oxygen Therapy: Patient Spontanous Breathing and Patient connected to nasal cannula oxygen  Post-op Assessment: Report given to RN and Post -op Vital signs reviewed and stable  Post vital signs: Reviewed and stable  Last Vitals:  Filed Vitals:   08/10/14 1803  BP: 143/83  Pulse: 68  Temp:   Resp: 19    Complications: No apparent anesthesia complications

## 2014-08-10 NOTE — Progress Notes (Signed)
Patient ID: Catherine [name], female   DOB: Jan 27, 1945, 69 y.o.   MRN: [id number] Vital signs stable Motor function appears intact Dressing clean and dry

## 2014-08-10 NOTE — H&P (Signed)
Catherine [name] is an 69 y.o. female.   Chief Complaint: Back pain with lumbar radiculopathy bilaterally L5-S1 HPI: Catherine [name] is a 69 year old individual who has had previous decompression fusion at L34 and L4-5. She recently sustained the rapid onset of back pain little over week ago she is initially treating herself with bed rest but the pain seemed to only accelerate itself and started to radiate into the buttocks. I seen her last week and obtained in MRI scan this past Friday which demonstrates presence of a large centrally herniated disc at the L5-S1 level since that time it seems that the pain has began to radiate into the buttocks she's been on some high dose T Royce help control the pain with modest effect she's been requiring substantial doses of narcotic pain medication which she does not like to take. She's been advised regarding need for surgical decompression at L5-S1 with bilateral laminotomies and discectomy will start on the left side is larger protrusion of the disc is off that side.  Past Medical History  Diagnosis Date  . Fever blister   . Spasmodic dysphonia   . Difficult intubation     needs pediatric equipment  . Arthritis   . Insomnia     Past Surgical History  Procedure Laterality Date  . Tubal ligation    . Knee surgery      Rt-95,Lft.-98,Replacement-09  . Rotator cuff repair  2004  . Cervical biopsy  w/ loop electrode excision  2006  . Colposcopy    . Back surgery      Fusion  . Tonsillectomy    . Cataract extraction w/ intraocular lens  implant, bilateral    . Colonoscopy      Family History  Problem Relation Age of Onset  . Hypertension Mother   . Dementia Mother   . Hypertension Father   . Diabetes Father   . Heart disease Father    Social History:  reports that she has never smoked. She has never used smokeless tobacco. She reports that she drinks about 6.0 oz of alcohol per week. She reports that she does not use illicit drugs.  Allergies:  No Known Allergies  No prescriptions prior to admission    No results found for this or any previous visit (from the past 48 hour(s)). No results found.  Review of Systems  HENT: Negative.   Eyes: Negative.   Respiratory: Negative.   Cardiovascular: Negative.   Gastrointestinal: Negative.   Genitourinary: Negative.   Musculoskeletal: Positive for back pain.  Skin: Negative.   Neurological: Positive for weakness.  Endo/Heme/Allergies: Negative.   Psychiatric/Behavioral: Negative.     There were no vitals taken for this visit. Physical Exam  Constitutional: She is oriented to person, place, and time. She appears well-developed and well-nourished.  HENT:  Head: Normocephalic and atraumatic.  Eyes: Conjunctivae and EOM are normal. Pupils are equal, round, and reactive to light.  Neck: Normal range of motion. Neck supple.  Cardiovascular: Normal rate and regular rhythm.   Respiratory: Effort normal and breath sounds normal.  GI: Soft. Bowel sounds are normal.  Musculoskeletal:  Moderate centralized back pain with straight leg raising positive at 15 bilaterally Patrick's maneuvers modestly positive on right side.  Neurological: She is alert and oriented to person, place, and time.  Cranial nerve examination is within limits of normal motor function lower extremities intact in iliopsoas quadriceps tibialis anterior and gastrocs deep tendon reflexes were slightly depressed and the Achilles but present in the patellae  bilaterally. Sensation is also intact distally in lower extremities.  Skin: Skin is warm and dry.  Psychiatric: She has a normal mood and affect. Her behavior is normal. Judgment and thought content normal.     Assessment/Plan Herniated nucleus pulposus L5-S1 central with bilateral lumbar radiculopathy  Bilateral microdiscectomy L5-S1.  Johannes Everage J 08/10/2014, 10:31 AM

## 2014-08-10 NOTE — Plan of Care (Signed)
Problem: Consults Goal: Diagnosis - Spinal Surgery Outcome: Completed/Met Date Met:  08/10/14 Microdiscectomy

## 2014-08-10 NOTE — Op Note (Signed)
Date of Surgery: 08/10/2014 Preoperative diagnosis: Herniated nucleus pulposus L5-S1 with bilateral radiculopathy Postoperative diagnosis: Herniated nucleus pulposus L5-S1 with bilateral radiculopathy Procedure: Bilateral laminotomy and microdiscectomy L5-S1 with decompression of L5 and S1 nerve roots operating microscope, microdissection technique. Surgeon: Kristeen Miss M.D. Anesthesia: Gen. endotracheal Indications: Catherine [name] is a 69 year old individual is had significant back and bilateral leg pain left worse than right. She has a large centrally herniated disc and was advised regarding surgical decompression as the pain has been unrelenting for over a week's time and is not responding well to strong narcotic medications and oral sterilely.  Procedure: Patient was brought to the operating room supine on a stretcher. After the smooth induction of general endotracheal anesthesia, she was turned prone and the bony prominences were appropriately padded and protected. The back was prepped with alcohol DuraPrep and draped in a sterile fashion. A midline incision was created just below the previous incision that she had for L3-L5 decompression and fusion. Subperiosteal dissection was performed on the left side first and this self-retaining McCullough retractor was placed into the wound. Then the laminotomy was created removing the inferior margin lamina of L5 out to the medial wall the facet. Partial mesial facetectomy was performed. The common dural tube was identified. There is noted to be substantial thickening of the yellow ligament and this was taken up carefully area the facet was also noted to be severely hypertrophied. The common dural tube was identified and superiorly the path of the L5 nerve root could be identified. In the axilla of the L5 nerve root was noted be a significant mass in by dissecting this region some epidural veins were cauterized and divided and a fragment came into view fragment  of disc was removed and then behind this there was noted to be several other fragments. The common dural tube was then mobilized medially and further exploration found that the disc fragment was behind the vertebral body of L5 and the disc space itself was rather stretched with a thin veil posterior longitudinal ligament. This was opened and the disc space was then able to be entered. A substantial quantity of severely degenerated and desiccated disc material was encountered and removed. Further exploration under the operating microscope yielded other fragments of disc were medially. He was however felt that the discontinued to proceed on the opposite side of the midline in that area was not allowing itself to be recovered from the ipsilateral side. At this point the opposite lamina was opened in the subcutaneous periosteal fashion and interlaminar dissection was then taken down again removing the inferior marginal lamina of L5 out to the medial wall the facet. By mouth ligament was taken up on the right side. The common dural tube was identified and similarly dissection yielded a significant mass under the ligament itself that lifted the dura and the S1 nerve root. Epidural veins in this region were cauterized and divided. The disc space was entered with a 15 blade and in the subligamentous space  a substantial disc material that was removed and again yielded good decompression of the common dural tube and right-sided nerve roots. In the in the disc space was evacuated thoroughly the endplates were noted to be intact. The common dural tube the S1 nerve roots and the L5 nerve roots were well decompressed and hemostasis in the epidural space was achieved meticulously. After exploring from one side to the other and removing other small fragments of disc within the disc space for the disc material could  be retrieved at this point the microscope was removed the retractor was removed lumbar dorsal fascia was closed with  #1 Vicryls. 10 mL of half percent Marcaine was injected into the paraspinous fascia 20 Vicryls used to close the subcutaneous tissues and 30 Vicryls used to close subcuticular skin. Dermabond was placed on the skin. Blood loss was estimated at less than 50 mL.

## 2014-08-10 NOTE — Anesthesia Procedure Notes (Signed)
Procedure Name: Intubation Date/Time: 08/10/2014 3:38 PM Performed by: Eligha Bridegroom Pre-anesthesia Checklist: Patient identified, Emergency Drugs available, Suction available, Patient being monitored and Timeout performed Patient Re-evaluated:Patient Re-evaluated prior to inductionOxygen Delivery Method: Circle system utilized Preoxygenation: Pre-oxygenation with 100% oxygen Intubation Type: IV induction Ventilation: Mask ventilation without difficulty Grade View: Grade I Tube type: Oral Tube size: 6.0 mm Number of attempts: 1 Airway Equipment and Method: Stylet and LTA kit utilized Placement Confirmation: ETT inserted through vocal cords under direct vision,  breath sounds checked- equal and bilateral and positive ETCO2 Secured at: 20 cm Tube secured with: Tape Dental Injury: Teeth and Oropharynx as per pre-operative assessment

## 2014-08-11 DIAGNOSIS — M5117 Intervertebral disc disorders with radiculopathy, lumbosacral region: Secondary | ICD-10-CM | POA: Diagnosis not present

## 2014-08-11 MED ORDER — HYDROCODONE-ACETAMINOPHEN 5-325 MG PO TABS: 1.0000 | ORAL_TABLET | ORAL | Status: DC | PRN

## 2014-08-11 MED ORDER — DIAZEPAM 5 MG PO TABS: 5.0000 mg | ORAL_TABLET | Freq: Four times a day (QID) | ORAL | Status: DC | PRN

## 2014-08-11 NOTE — Progress Notes (Signed)
Patient alert and oriented, mae's well, voiding adequate amount of urine, swallowing without difficulty, c/o mild pain and medication given prior to discharged. Patient discharged home with family. Script and discharged instructions given to patient. Patient and spouse stated understanding of d/c instructions given and has an appointment with MD.

## 2014-08-11 NOTE — Anesthesia Postprocedure Evaluation (Signed)
Anesthesia Post Note  Patient: Catherine [name]  Procedure(s) Performed: Procedure(s) (LRB): Bilateral Lumbar five-Sacral one Diskectomy (Bilateral)  Anesthesia type: general  Patient location: PACU  Post pain: Pain level controlled  Post assessment: Patient's Cardiovascular Status Stable  Post vital signs: Reviewed and stable  Level of consciousness: sedated  Complications: No apparent anesthesia complications

## 2014-08-11 NOTE — Evaluation (Addendum)
Occupational Therapy Evaluation Patient Details Name: Catherine [name] MRN: [id number] DOB: 22-May-1945 Today's Date: 08/11/2014    History of Present Illness   69 y.o. S/p bilateral microdiscectomy L5-S1. Pt has had previous decompression fusion at L3-4 and L4-5.    Clinical Impression   Education provided in session to pt and spouse. Pt going home today. Family available to assist at home.     Follow Up Recommendations  No OT follow up;Supervision - Intermittent    Equipment Recommendations  None recommended by OT    Recommendations for Other Services       Precautions / Restrictions Precautions Precautions: Back;Fall Precaution Booklet Issued: Yes (comment) (given by nurse) Precaution Comments: educated on back precautions Restrictions Weight Bearing Restrictions: No      Mobility Bed Mobility    Not assessed              Transfers Overall transfer level: Needs assistance   Transfers: Sit to/from Stand Sit to Stand: Supervision              Balance                                            ADL Overall ADL's : Needs assistance/impaired     Grooming: Brushing hair;Standing;Oral care;Supervision/safety;Set up               Lower Body Dressing: Supervision/safety;Setup;Sit to/from stand   Toilet Transfer: Min guard;Ambulation (sit to stand from bed)   Toileting- Clothing Manipulation and Hygiene: Sitting/lateral lean;Supervision/safety Toileting - Clothing Manipulation Details (indicate cue type and reason): pt able to reach behind sitting on bed, so suspect she could perform hygiene at supervision level.      Functional mobility during ADLs: Min guard General ADL Comments: Educated on LB ADL technique-pt able to cross legs over knees. Educated on safety such as safe footwear and sitting for LB bathing, unless someone does that for her. Discussed incorporating precautions into functional activities (use of cup for oral  care, placement of grooming items, bending knees with her back straight when reaching for LB clothing, not raising arms above 90 degrees). Pt reported difficulty with left shoe, so showed her a long shoehorn. Educated on what pt could use for toilet aide if needed.  Discussed what pt could use at home for shower chair.      Vision     Perception     Praxis      Pertinent Vitals/Pain Pain Assessment: 0-10 Pain Score: 7  Pain Location: left leg Pain Descriptors / Indicators: Sore Pain Intervention(s): Monitored during session     Hand Dominance     Extremity/Trunk Assessment Upper Extremity Assessment Upper Extremity Assessment: Overall WFL for tasks assessed   Lower Extremity Assessment Lower Extremity Assessment: Defer to PT evaluation       Communication Communication Communication: No difficulties   Cognition Arousal/Alertness: Awake/alert Behavior During Therapy: WFL for tasks assessed/performed Overall Cognitive Status: Within Functional Limits for tasks assessed                     General Comments       Exercises       Shoulder Instructions      Home Living Family/patient expects to be discharged to:: Private residence Living Arrangements: Spouse/significant other Available Help at Discharge: Family (daughter staying one day) Type of Home: House  Home Access: Level entry     Home Layout: Multi-level; can stay on main level     Bathroom Shower/Tub: Occupational psychologist:  (have both)                Prior Functioning/Environment Level of Independence: Independent             OT Diagnosis: Acute pain   OT Problem List:     OT Treatment/Interventions:      OT Goals(Current goals can be found in the care plan section)    OT Frequency:     Barriers to D/C:            Co-evaluation              End of Session Nurse Communication: Other (comment) (nurse present in session)  Activity Tolerance: Patient  tolerated treatment well Patient left: Other (comment) (standing up/then got in wheelchair with nurse tech)   Time: 3567-0141 OT Time Calculation (min): 13 min Charges:  OT General Charges $OT Visit: 1 Procedure OT Evaluation $Initial OT Evaluation Tier I: 1 Procedure G-Codes: OT G-codes **NOT FOR INPATIENT CLASS** Functional Assessment Tool Used: clinical judgment Functional Limitation: Self care Self Care Current Status (C3013): At least 1 percent but less than 20 percent impaired, limited or restricted Self Care Goal Status (H4388): At least 1 percent but less than 20 percent impaired, limited or restricted Self Care Discharge Status [phone]): At least 1 percent but less than 20 percent impaired, limited or restricted  Benito Mccreedy OTR/L [phone] 08/11/2014, 9:14 AM

## 2014-08-11 NOTE — Discharge Summary (Signed)
Physician Discharge Summary  Patient ID: Catherine [name] MRN: [id number] DOB/AGE: May 17, 1945 68 y.o.  Admit date: 08/10/2014 Discharge date: 08/11/2014  Admission Diagnoses:Herniated nucleus pulposis L5S1 with Radiculopathy  Discharge Diagnoses: Herniated nucleus pulposis L5S1 with Radiculopathy Active Problems:   Herniated nucleus pulposus, L5-S1   Discharged Condition: good  Hospital Course: Tolerated surgery well, neuro intact  Consults: None  Significant Diagnostic Studies: none  Treatments: surgery: bilateral microdiscectomy L5S1  Discharge Exam: Blood pressure 115/89, pulse 62, temperature 97.9 F (36.6 C), temperature source Oral, resp. rate 18, weight 51.256 kg (113 lb), SpO2 98 %. incision clean and dry motor function intact  Disposition: 06-Home-Health Care Svc  Discharge Instructions    Call MD for:  redness, tenderness, or signs of infection (pain, swelling, redness, odor or green/yellow discharge around incision site)    Complete by:  As directed      Call MD for:  severe uncontrolled pain    Complete by:  As directed      Call MD for:  temperature >100.4    Complete by:  As directed      Diet - low sodium heart healthy    Complete by:  As directed      Discharge instructions    Complete by:  As directed   Okay to shower. Do not apply salves or appointments to incision. No heavy lifting with the upper extremities greater than 15 pounds. May resume driving when not requiring pain medication and patient feels comfortable with doing so.     Increase activity slowly    Complete by:  As directed             Medication List    STOP taking these medications        dexamethasone 4 MG tablet  Commonly known as:  DECADRON      TAKE these medications        BOTOX IJ  Inject as directed.     CELEBREX PO  Take 200 tablets by mouth daily.     cholecalciferol 1000 UNITS tablet  Commonly known as:  VITAMIN D  Take 1,000 Units by mouth daily.     diazepam 5 MG tablet  Commonly known as:  VALIUM  Take 5 mg by mouth every 6 (six) hours as needed for anxiety.     diazepam 5 MG tablet  Commonly known as:  VALIUM  Take 1 tablet (5 mg total) by mouth every 6 (six) hours as needed for muscle spasms.     Estradiol-Norethindrone Acet 0.5-0.1 MG per tablet  Take 1 tablet by mouth daily.     fish oil-omega-3 fatty acids 1000 MG capsule  Take 2 g by mouth daily.     HYDROcodone-acetaminophen 5-325 MG per tablet  Commonly known as:  NORCO/VICODIN  Take 1 tablet by mouth every 6 (six) hours as needed for moderate pain.     HYDROcodone-acetaminophen 5-325 MG per tablet  Commonly known as:  NORCO/VICODIN  Take 1-2 tablets by mouth every 4 (four) hours as needed for moderate pain.     multivitamin capsule  Take 1 capsule by mouth daily.     VITAMIN B-12 PO  Take 1 tablet by mouth daily.     vitamin C 100 MG tablet  Take 100 mg by mouth daily.     zolpidem 10 MG tablet  Commonly known as:  AMBIEN  Take 1 tablet by mouth daily.         SignedEarleen Newport 08/11/2014, 8:20  AM

## 2014-08-12 ENCOUNTER — Encounter (HOSPITAL_COMMUNITY): Payer: Self-pay | Admitting: Neurological Surgery

## 2014-08-12 ENCOUNTER — Other Ambulatory Visit: Payer: Self-pay | Admitting: Neurological Surgery

## 2014-08-12 ENCOUNTER — Ambulatory Visit
Admission: RE | Admit: 2014-08-12 | Discharge: 2014-08-12 | Disposition: A | Discharge status: Home / Self Care | Payer: 59 | Source: Ambulatory Visit | Attending: Neurological Surgery | Admitting: Neurological Surgery

## 2014-08-12 DIAGNOSIS — M4716 Other spondylosis with myelopathy, lumbar region: Secondary | ICD-10-CM

## 2014-08-13 ENCOUNTER — Encounter (HOSPITAL_COMMUNITY): Payer: Self-pay | Admitting: *Deleted

## 2014-08-13 ENCOUNTER — Inpatient Hospital Stay (HOSPITAL_COMMUNITY): Payer: 59 | Admitting: Certified Registered"

## 2014-08-13 ENCOUNTER — Inpatient Hospital Stay (HOSPITAL_COMMUNITY): Payer: 59

## 2014-08-13 ENCOUNTER — Encounter (HOSPITAL_COMMUNITY)
Admission: RE | Disposition: A | Discharge status: Home / Self Care | Payer: 59 | Source: Ambulatory Visit | Attending: Neurological Surgery

## 2014-08-13 ENCOUNTER — Other Ambulatory Visit: Payer: Self-pay | Admitting: Neurological Surgery

## 2014-08-13 ENCOUNTER — Inpatient Hospital Stay (HOSPITAL_COMMUNITY)
Admission: RE | Admit: 2014-08-13 | Discharge: 2014-08-15 | DRG: 460 | Disposition: A | Discharge status: Home / Self Care | Payer: 59 | Source: Ambulatory Visit | Attending: Neurological Surgery | Admitting: Neurological Surgery

## 2014-08-13 DIAGNOSIS — Z961 Presence of intraocular lens: Secondary | ICD-10-CM | POA: Diagnosis present

## 2014-08-13 DIAGNOSIS — M5117 Intervertebral disc disorders with radiculopathy, lumbosacral region: Secondary | ICD-10-CM | POA: Diagnosis present

## 2014-08-13 DIAGNOSIS — Z9842 Cataract extraction status, left eye: Secondary | ICD-10-CM

## 2014-08-13 DIAGNOSIS — M549 Dorsalgia, unspecified: Secondary | ICD-10-CM | POA: Diagnosis present

## 2014-08-13 DIAGNOSIS — Z9841 Cataract extraction status, right eye: Secondary | ICD-10-CM

## 2014-08-13 DIAGNOSIS — M1990 Unspecified osteoarthritis, unspecified site: Secondary | ICD-10-CM | POA: Diagnosis present

## 2014-08-13 DIAGNOSIS — M4307 Spondylolysis, lumbosacral region: Secondary | ICD-10-CM

## 2014-08-13 DIAGNOSIS — Z419 Encounter for procedure for purposes other than remedying health state, unspecified: Secondary | ICD-10-CM

## 2014-08-13 HISTORY — DX: Spondylolysis, lumbosacral region: M43.07

## 2014-08-13 LAB — TYPE AND SCREEN
ABO/RH(D): O POS
ANTIBODY SCREEN: NEGATIVE

## 2014-08-13 SURGERY — POSTERIOR LUMBAR FUSION 1 LEVEL
Anesthesia: General | Site: Back

## 2014-08-13 MED ORDER — PROPOFOL 10 MG/ML IV BOLUS
INTRAVENOUS | Status: DC | PRN
  Administered 2014-08-13: 100 mg via INTRAVENOUS

## 2014-08-13 MED ORDER — ONDANSETRON HCL 4 MG/2ML IJ SOLN
INTRAMUSCULAR | Status: DC | PRN
Start: 2014-08-13 — End: 2014-08-13
  Administered 2014-08-13: 4 mg via INTRAVENOUS

## 2014-08-13 MED ORDER — ONDANSETRON HCL 4 MG/2ML IJ SOLN: 4.0000 mg | INTRAMUSCULAR | Status: DC | PRN

## 2014-08-13 MED ORDER — NEOSTIGMINE METHYLSULFATE 10 MG/10ML IV SOLN
INTRAVENOUS | Status: DC | PRN
  Administered 2014-08-13: 3 mg via INTRAVENOUS

## 2014-08-13 MED ORDER — ALBUMIN HUMAN 5 % IV SOLN
INTRAVENOUS | Status: DC | PRN
  Administered 2014-08-13: 13:00:00 via INTRAVENOUS

## 2014-08-13 MED ORDER — CEFAZOLIN SODIUM-DEXTROSE 2-3 GM-% IV SOLR
INTRAVENOUS | Status: AC
  Administered 2014-08-13: 2 g via INTRAVENOUS
  Filled 2014-08-13: qty 50

## 2014-08-13 MED ORDER — MIDAZOLAM HCL 5 MG/5ML IJ SOLN
INTRAMUSCULAR | Status: DC | PRN
  Administered 2014-08-13: 2 mg via INTRAVENOUS

## 2014-08-13 MED ORDER — KETOROLAC TROMETHAMINE 15 MG/ML IJ SOLN
15.0000 mg | Freq: Four times a day (QID) | INTRAMUSCULAR | Status: AC
  Administered 2014-08-13 – 2014-08-14 (×5): 15 mg via INTRAVENOUS
  Filled 2014-08-13 (×4): qty 1

## 2014-08-13 MED ORDER — LIDOCAINE HCL (CARDIAC) 20 MG/ML IV SOLN
INTRAVENOUS | Status: DC | PRN
  Administered 2014-08-13: 60 mg via INTRAVENOUS

## 2014-08-13 MED ORDER — METHOCARBAMOL 500 MG PO TABS: 500.0000 mg | ORAL_TABLET | Freq: Four times a day (QID) | ORAL | Status: DC | PRN

## 2014-08-13 MED ORDER — FLEET ENEMA 7-19 GM/118ML RE ENEM: 1.0000 | ENEMA | Freq: Once | RECTAL | Status: AC | PRN

## 2014-08-13 MED ORDER — ALUM & MAG HYDROXIDE-SIMETH 200-200-20 MG/5ML PO SUSP: 30.0000 mL | Freq: Four times a day (QID) | ORAL | Status: DC | PRN

## 2014-08-13 MED ORDER — HYDROMORPHONE HCL 1 MG/ML IJ SOLN
INTRAMUSCULAR | Status: AC
  Filled 2014-08-13: qty 1

## 2014-08-13 MED ORDER — 0.9 % SODIUM CHLORIDE (POUR BTL) OPTIME
TOPICAL | Status: DC | PRN
  Administered 2014-08-13: 1000 mL

## 2014-08-13 MED ORDER — FENTANYL CITRATE (PF) 100 MCG/2ML IJ SOLN: 25.0000 ug | INTRAMUSCULAR | Status: DC | PRN

## 2014-08-13 MED ORDER — LACTATED RINGERS IV SOLN
INTRAVENOUS | Status: DC | PRN
  Administered 2014-08-13 (×2): via INTRAVENOUS

## 2014-08-13 MED ORDER — SUCCINYLCHOLINE CHLORIDE 20 MG/ML IJ SOLN
INTRAMUSCULAR | Status: DC | PRN
  Administered 2014-08-13: 80 mg via INTRAVENOUS

## 2014-08-13 MED ORDER — ESTRADIOL-NORETHINDRONE ACET 0.5-0.1 MG PO TABS
1.0000 | ORAL_TABLET | Freq: Every day | ORAL | Status: DC
  Administered 2014-08-14: 1 via ORAL

## 2014-08-13 MED ORDER — SODIUM CHLORIDE 0.9 % IR SOLN
Status: DC | PRN
  Administered 2014-08-13: 12:00:00

## 2014-08-13 MED ORDER — BUPIVACAINE HCL (PF) 0.5 % IJ SOLN
INTRAMUSCULAR | Status: DC | PRN
  Administered 2014-08-13: 20 mL

## 2014-08-13 MED ORDER — SENNA 8.6 MG PO TABS
1.0000 | ORAL_TABLET | Freq: Two times a day (BID) | ORAL | Status: DC
  Administered 2014-08-13 – 2014-08-15 (×3): 8.6 mg via ORAL
  Filled 2014-08-13 (×3): qty 1

## 2014-08-13 MED ORDER — MENTHOL 3 MG MT LOZG: 1.0000 | LOZENGE | OROMUCOSAL | Status: DC | PRN

## 2014-08-13 MED ORDER — SODIUM CHLORIDE 0.9 % IJ SOLN: 3.0000 mL | INTRAMUSCULAR | Status: DC | PRN

## 2014-08-13 MED ORDER — ROCURONIUM BROMIDE 100 MG/10ML IV SOLN
INTRAVENOUS | Status: DC | PRN
  Administered 2014-08-13: 30 mg via INTRAVENOUS
  Administered 2014-08-13 (×2): 10 mg via INTRAVENOUS

## 2014-08-13 MED ORDER — SODIUM CHLORIDE 0.9 % IV SOLN: 250.0000 mL | INTRAVENOUS | Status: DC

## 2014-08-13 MED ORDER — ONDANSETRON HCL 4 MG/2ML IJ SOLN
4.0000 mg | INTRAMUSCULAR | Status: DC | PRN
  Administered 2014-08-14: 4 mg via INTRAVENOUS
  Filled 2014-08-13 (×2): qty 2

## 2014-08-13 MED ORDER — LIDOCAINE HCL (CARDIAC) 20 MG/ML IV SOLN
INTRAVENOUS | Status: AC
  Filled 2014-08-13: qty 5

## 2014-08-13 MED ORDER — SODIUM CHLORIDE 0.9 % IV SOLN: INTRAVENOUS | Status: DC

## 2014-08-13 MED ORDER — FENTANYL CITRATE (PF) 100 MCG/2ML IJ SOLN
INTRAMUSCULAR | Status: AC
  Filled 2014-08-13: qty 2

## 2014-08-13 MED ORDER — HYDROCODONE-ACETAMINOPHEN 5-325 MG PO TABS
1.0000 | ORAL_TABLET | ORAL | Status: DC | PRN
  Administered 2014-08-13 – 2014-08-15 (×8): 2 via ORAL
  Filled 2014-08-13 (×8): qty 2

## 2014-08-13 MED ORDER — PHENOL 1.4 % MT LIQD: 1.0000 | OROMUCOSAL | Status: DC | PRN

## 2014-08-13 MED ORDER — GLYCOPYRROLATE 0.2 MG/ML IJ SOLN
INTRAMUSCULAR | Status: DC | PRN
  Administered 2014-08-13: 0.4 mg via INTRAVENOUS

## 2014-08-13 MED ORDER — DOCUSATE SODIUM 100 MG PO CAPS
100.0000 mg | ORAL_CAPSULE | Freq: Two times a day (BID) | ORAL | Status: DC
  Administered 2014-08-13 – 2014-08-15 (×4): 100 mg via ORAL
  Filled 2014-08-13 (×4): qty 1

## 2014-08-13 MED ORDER — ACETAMINOPHEN 650 MG RE SUPP: 650.0000 mg | RECTAL | Status: DC | PRN

## 2014-08-13 MED ORDER — MORPHINE SULFATE 2 MG/ML IJ SOLN: 1.0000 mg | INTRAMUSCULAR | Status: DC | PRN

## 2014-08-13 MED ORDER — ZOLPIDEM TARTRATE 5 MG PO TABS
5.0000 mg | ORAL_TABLET | Freq: Every day | ORAL | Status: DC
  Administered 2014-08-13: 5 mg via ORAL
  Filled 2014-08-13: qty 1

## 2014-08-13 MED ORDER — HYDROCODONE-ACETAMINOPHEN 5-325 MG PO TABS: 1.0000 | ORAL_TABLET | ORAL | Status: DC | PRN

## 2014-08-13 MED ORDER — ZOLPIDEM TARTRATE 5 MG PO TABS: 10.0000 mg | ORAL_TABLET | Freq: Every day | ORAL | Status: DC

## 2014-08-13 MED ORDER — SODIUM CHLORIDE 0.9 % IJ SOLN: 3.0000 mL | Freq: Two times a day (BID) | INTRAMUSCULAR | Status: DC

## 2014-08-13 MED ORDER — HYDROMORPHONE HCL 1 MG/ML IJ SOLN
0.2500 mg | INTRAMUSCULAR | Status: DC | PRN
  Administered 2014-08-13 (×3): 0.5 mg via INTRAVENOUS

## 2014-08-13 MED ORDER — KETOROLAC TROMETHAMINE 15 MG/ML IJ SOLN
INTRAMUSCULAR | Status: AC
  Filled 2014-08-13: qty 1

## 2014-08-13 MED ORDER — MEPERIDINE HCL 25 MG/ML IJ SOLN: 6.2500 mg | INTRAMUSCULAR | Status: DC | PRN

## 2014-08-13 MED ORDER — PHENYLEPHRINE HCL 10 MG/ML IJ SOLN
INTRAMUSCULAR | Status: DC | PRN
  Administered 2014-08-13 (×4): 40 ug via INTRAVENOUS

## 2014-08-13 MED ORDER — FENTANYL CITRATE (PF) 250 MCG/5ML IJ SOLN
INTRAMUSCULAR | Status: AC
  Filled 2014-08-13: qty 5

## 2014-08-13 MED ORDER — DIAZEPAM 5 MG PO TABS
5.0000 mg | ORAL_TABLET | Freq: Four times a day (QID) | ORAL | Status: DC | PRN
  Administered 2014-08-13 – 2014-08-14 (×2): 5 mg via ORAL
  Filled 2014-08-13 (×2): qty 1

## 2014-08-13 MED ORDER — FENTANYL CITRATE (PF) 100 MCG/2ML IJ SOLN
INTRAMUSCULAR | Status: DC | PRN
  Administered 2014-08-13: 50 ug via INTRAVENOUS
  Administered 2014-08-13: 100 ug via INTRAVENOUS
  Administered 2014-08-13 (×3): 50 ug via INTRAVENOUS

## 2014-08-13 MED ORDER — THROMBIN 20000 UNITS EX SOLR
CUTANEOUS | Status: DC | PRN
  Administered 2014-08-13: 12:00:00 via TOPICAL

## 2014-08-13 MED ORDER — ACETAMINOPHEN 325 MG PO TABS: 650.0000 mg | ORAL_TABLET | ORAL | Status: DC | PRN

## 2014-08-13 MED ORDER — PHENYLEPHRINE 40 MCG/ML (10ML) SYRINGE FOR IV PUSH (FOR BLOOD PRESSURE SUPPORT)
PREFILLED_SYRINGE | INTRAVENOUS | Status: AC
  Filled 2014-08-13: qty 10

## 2014-08-13 MED ORDER — METHOCARBAMOL 1000 MG/10ML IJ SOLN
500.0000 mg | Freq: Once | INTRAVENOUS | Status: AC
  Administered 2014-08-13: 500 mg via INTRAVENOUS
  Filled 2014-08-13: qty 5

## 2014-08-13 MED ORDER — LIDOCAINE HCL 4 % MT SOLN
OROMUCOSAL | Status: DC | PRN
  Administered 2014-08-13: 4 mL via TOPICAL

## 2014-08-13 MED ORDER — SUCCINYLCHOLINE CHLORIDE 20 MG/ML IJ SOLN
INTRAMUSCULAR | Status: AC
  Filled 2014-08-13: qty 1

## 2014-08-13 MED ORDER — ONDANSETRON HCL 4 MG/2ML IJ SOLN: 4.0000 mg | Freq: Once | INTRAMUSCULAR | Status: DC | PRN

## 2014-08-13 MED ORDER — POLYETHYLENE GLYCOL 3350 17 G PO PACK
17.0000 g | PACK | Freq: Every day | ORAL | Status: DC | PRN
  Administered 2014-08-14: 17 g via ORAL
  Filled 2014-08-13: qty 1

## 2014-08-13 MED ORDER — PROPOFOL 10 MG/ML IV BOLUS
INTRAVENOUS | Status: AC
  Filled 2014-08-13: qty 20

## 2014-08-13 MED ORDER — THROMBIN 5000 UNITS EX SOLR
OROMUCOSAL | Status: DC | PRN
  Administered 2014-08-13: 12:00:00 via TOPICAL

## 2014-08-13 MED ORDER — ACETAMINOPHEN 325 MG PO TABS
650.0000 mg | ORAL_TABLET | ORAL | Status: DC | PRN
Start: 2014-08-13 — End: 2014-08-15

## 2014-08-13 MED ORDER — ROCURONIUM BROMIDE 50 MG/5ML IV SOLN
INTRAVENOUS | Status: AC
  Filled 2014-08-13: qty 1

## 2014-08-13 MED ORDER — BISACODYL 10 MG RE SUPP
10.0000 mg | Freq: Every day | RECTAL | Status: DC | PRN
  Administered 2014-08-14: 10 mg via RECTAL
  Filled 2014-08-13: qty 1

## 2014-08-13 MED ORDER — DEXAMETHASONE SODIUM PHOSPHATE 10 MG/ML IJ SOLN
INTRAMUSCULAR | Status: DC | PRN
  Administered 2014-08-13: 10 mg via INTRAVENOUS

## 2014-08-13 MED ORDER — SODIUM CHLORIDE 0.9 % IJ SOLN
3.0000 mL | Freq: Two times a day (BID) | INTRAMUSCULAR | Status: DC
  Administered 2014-08-14 – 2014-08-15 (×2): 3 mL via INTRAVENOUS

## 2014-08-13 MED ORDER — LACTATED RINGERS IV SOLN: INTRAVENOUS | Status: DC

## 2014-08-13 MED ORDER — MIDAZOLAM HCL 2 MG/2ML IJ SOLN
INTRAMUSCULAR | Status: AC
  Filled 2014-08-13: qty 2

## 2014-08-13 MED ORDER — METHOCARBAMOL 1000 MG/10ML IJ SOLN
500.0000 mg | Freq: Four times a day (QID) | INTRAVENOUS | Status: DC | PRN
  Administered 2014-08-13: 500 mg via INTRAVENOUS
  Filled 2014-08-13 (×3): qty 5

## 2014-08-13 SURGICAL SUPPLY — 62 items
ADH SKN CLS APL DERMABOND .7 (GAUZE/BANDAGES/DRESSINGS) ×1
BAG DECANTER FOR FLEXI CONT (MISCELLANEOUS) ×2 IMPLANT
BLADE CLIPPER SURG (BLADE) IMPLANT
BONE MATRIX OSTEOCEL PRO MED (Bone Implant) ×1 IMPLANT
BUR MATCHSTICK NEURO 3.0 LAGG (BURR) ×2 IMPLANT
CAGE COROENT LRG MP 8X9X28-12 (Cage) ×2 IMPLANT
CANISTER SUCT 3000ML PPV (MISCELLANEOUS) ×2 IMPLANT
CONT SPEC 4OZ CLIKSEAL STRL BL (MISCELLANEOUS) ×4 IMPLANT
COVER BACK TABLE 60X90IN (DRAPES) ×2 IMPLANT
DECANTER SPIKE VIAL GLASS SM (MISCELLANEOUS) ×2 IMPLANT
DERMABOND ADVANCED (GAUZE/BANDAGES/DRESSINGS) ×1
DERMABOND ADVANCED .7 DNX12 (GAUZE/BANDAGES/DRESSINGS) ×1 IMPLANT
DRAPE C-ARM 42X72 X-RAY (DRAPES) ×4 IMPLANT
DRAPE LAPAROTOMY 100X72X124 (DRAPES) ×2 IMPLANT
DRAPE POUCH INSTRU U-SHP 10X18 (DRAPES) ×2 IMPLANT
DRAPE PROXIMA HALF (DRAPES) IMPLANT
DRSG OPSITE POSTOP 4X6 (GAUZE/BANDAGES/DRESSINGS) ×1 IMPLANT
DURAPREP 26ML APPLICATOR (WOUND CARE) ×2 IMPLANT
ELECT REM PT RETURN 9FT ADLT (ELECTROSURGICAL) ×2
ELECTRODE REM PT RTRN 9FT ADLT (ELECTROSURGICAL) ×1 IMPLANT
GAUZE SPONGE 4X4 12PLY STRL (GAUZE/BANDAGES/DRESSINGS) ×2 IMPLANT
GAUZE SPONGE 4X4 16PLY XRAY LF (GAUZE/BANDAGES/DRESSINGS) IMPLANT
GLOVE BIOGEL PI IND STRL 8.5 (GLOVE) ×2 IMPLANT
GLOVE BIOGEL PI INDICATOR 8.5 (GLOVE) ×2
GLOVE ECLIPSE 8.5 STRL (GLOVE) ×4 IMPLANT
GLOVE EXAM NITRILE LRG STRL (GLOVE) IMPLANT
GLOVE EXAM NITRILE MD LF STRL (GLOVE) IMPLANT
GLOVE EXAM NITRILE XL STR (GLOVE) IMPLANT
GLOVE EXAM NITRILE XS STR PU (GLOVE) IMPLANT
GLOVE INDICATOR 7.5 STRL GRN (GLOVE) ×3 IMPLANT
GLOVE SURG SS PI 7.0 STRL IVOR (GLOVE) ×4 IMPLANT
GOWN STRL REUS W/ TWL LRG LVL3 (GOWN DISPOSABLE) IMPLANT
GOWN STRL REUS W/ TWL XL LVL3 (GOWN DISPOSABLE) IMPLANT
GOWN STRL REUS W/TWL 2XL LVL3 (GOWN DISPOSABLE) ×4 IMPLANT
GOWN STRL REUS W/TWL LRG LVL3 (GOWN DISPOSABLE) ×2
GOWN STRL REUS W/TWL XL LVL3 (GOWN DISPOSABLE)
HEMOSTAT POWDER KIT SURGIFOAM (HEMOSTASIS) ×1 IMPLANT
KIT BASIN OR (CUSTOM PROCEDURE TRAY) ×2 IMPLANT
KIT ROOM TURNOVER OR (KITS) ×2 IMPLANT
MILL MEDIUM DISP (BLADE) ×1 IMPLANT
NEEDLE HYPO 22GX1.5 SAFETY (NEEDLE) ×2 IMPLANT
NS IRRIG 1000ML POUR BTL (IV SOLUTION) ×2 IMPLANT
PACK LAMINECTOMY NEURO (CUSTOM PROCEDURE TRAY) ×2 IMPLANT
PAD ARMBOARD 7.5X6 YLW CONV (MISCELLANEOUS) ×6 IMPLANT
PATTIES SURGICAL .5 X1 (DISPOSABLE) ×2 IMPLANT
ROD RELINE 0-0 CON M 5.0/6.0MM (Rod) ×2 IMPLANT
ROD RELINE LOROTIC TI 5.5X55MM (Rod) ×1 IMPLANT
ROD RELINE-O LORD 5.5X50MM (Rod) ×1 IMPLANT
SCREW RELINE-O POLY 6.5X45 (Screw) ×2 IMPLANT
SPONGE LAP 4X18 X RAY DECT (DISPOSABLE) IMPLANT
SPONGE SURGIFOAM ABS GEL 100 (HEMOSTASIS) ×2 IMPLANT
SUT VIC AB 1 CT1 18XBRD ANBCTR (SUTURE) ×1 IMPLANT
SUT VIC AB 1 CT1 8-18 (SUTURE) ×4
SUT VIC AB 2-0 CP2 18 (SUTURE) ×3 IMPLANT
SUT VIC AB 3-0 SH 8-18 (SUTURE) ×2 IMPLANT
SYR 20ML ECCENTRIC (SYRINGE) ×2 IMPLANT
SYR 3ML LL SCALE MARK (SYRINGE) ×8 IMPLANT
TOWEL OR 17X24 6PK STRL BLUE (TOWEL DISPOSABLE) ×2 IMPLANT
TOWEL OR 17X26 10 PK STRL BLUE (TOWEL DISPOSABLE) ×2 IMPLANT
TRAP SPECIMEN MUCOUS 40CC (MISCELLANEOUS) ×2 IMPLANT
TRAY FOLEY W/METER SILVER 14FR (SET/KITS/TRAYS/PACK) ×2 IMPLANT
WATER STERILE IRR 1000ML POUR (IV SOLUTION) ×2 IMPLANT

## 2014-08-13 NOTE — Anesthesia Procedure Notes (Signed)
Procedure Name: Intubation Date/Time: 08/13/2014 1:13 PM Performed by: Lavell Luster Pre-anesthesia Checklist: Patient identified, Emergency Drugs available, Suction available, Patient being monitored and Timeout performed Patient Re-evaluated:Patient Re-evaluated prior to inductionOxygen Delivery Method: Circle system utilized Preoxygenation: Pre-oxygenation with 100% oxygen Intubation Type: IV induction Ventilation: Mask ventilation without difficulty Laryngoscope Size: Mac and 3 Grade View: Grade I Tube type: Oral Tube size: 6.0 mm Number of attempts: 1 Airway Equipment and Method: Stylet Placement Confirmation: ETT inserted through vocal cords under direct vision,  positive ETCO2 and breath sounds checked- equal and bilateral Secured at: 21 cm Tube secured with: Tape Dental Injury: Teeth and Oropharynx as per pre-operative assessment  Comments: Easy atraumatic induction and intubation with MAC 3 blade.  Dr. Oletta Lamas verified placement before and after positioning prone.  Henderson Cloud, CRNA

## 2014-08-13 NOTE — H&P (Signed)
Catherine [name] is an 69 y.o. female.   Chief Complaint: Severe back and left lower extremity pain with weakness 1 day after discectomy L5-S1 HPI: Catherine [name] 69 year old individual who on Tuesday of this week underwent bilateral laminotomy and discectomy at L5-S1 for a large centrally herniated disc initially she felt quite well however the night after admission when trying to move about she's experienced the severe onset of back pain and left lower extremity pain a CT scan was performed yesterday and this demonstrates the presence of bilateral pars defects. His Raben has had an arthrodesis from L3-L5 the pars defects were not evident preoperatively or on any previous films she is having excruciating pain in the back and left lower extremity and is felt that this is result of instability at the L5-S1 level. She is now being readmitted to undergo surgical arthrodesis with posterior lumbar interbody technique and pedicle screw fixation of S1 to the L3-L5 construct.  Past Medical History  Diagnosis Date  . Fever blister   . Spasmodic dysphonia   . Difficult intubation     needs pediatric equipment  . Arthritis   . Insomnia     Past Surgical History  Procedure Laterality Date  . Tubal ligation    . Knee surgery      Rt-95,Lft.-98,Replacement-09  . Rotator cuff repair  2004  . Cervical biopsy  w/ loop electrode excision  2006  . Colposcopy    . Back surgery      Fusion  . Tonsillectomy    . Cataract extraction w/ intraocular lens  implant, bilateral    . Colonoscopy    . Lumbar laminectomy/decompression microdiscectomy Bilateral 08/10/2014    Procedure: Bilateral Lumbar five-Sacral one Diskectomy;  Surgeon: Kristeen Miss, MD;  Location: [facility];  Service: Neurosurgery;  Laterality: Bilateral;  Bilateral L5-S1 Diskectomy    Family History  Problem Relation Age of Onset  . Hypertension Mother   . Dementia Mother   . Hypertension Father   . Diabetes Father   . Heart disease  Father    Social History:  reports that she has never smoked. She has never used smokeless tobacco. She reports that she drinks about 6.0 oz of alcohol per week. She reports that she does not use illicit drugs.  Allergies: No Known Allergies  No prescriptions prior to admission    No results found for this or any previous visit (from the past 48 hour(s)). Ct Lumbar Spine Wo Contrast  08/12/2014   ADDENDUM REPORT: 08/12/2014 17:20  ADDENDUM: Additional comparison study has been discovered, CT myelography of the lumbar spine 05/27/2009. Absence of bilateral L5 pars defects on that study makes aging less certain, but the sclerotic margins still favor chronicity. Utility of MRI 08/06/2014 for aging is hindered by artifact from closely neighboring pedicle screws.   Electronically Signed   By: Monte Fantasia M.D.   On: 08/12/2014 17:20   08/12/2014   CLINICAL DATA:  Severe back pain with left leg pain after discectomies 08/10/2014. Injury practicing yoga.  EXAM: CT LUMBAR SPINE WITHOUT CONTRAST  TECHNIQUE: Multidetector CT imaging of the lumbar spine was performed without intravenous contrast administration. Multiplanar CT image reconstructions were also generated.  COMPARISON:  Lumbar spine MRI 08/06/2014  FINDINGS: Spinal numbering as previously established. Remote L3-4 and L4-5 discectomy with posterior rod and pedicle screw fixation. Bony fusion is complete at these levels and there is no residual osseous stenosis. No acute fracture, endplate erosion, or focal bone lesion.  Recent surgery  with expected subcutaneous gas and fluid at the incision.  Degenerative changes:  T12- L1: Mild disc narrowing.  No evidence of impingement.  L1-L2: Mild disc narrowing and bulging.  No impingement.  L2-L3: Adjacent segment disease with slight retrolisthesis and disc narrowing with circumferential bulging and annular ossification. No evidence of significant stenosis.  L3-L4: Discectomy with completed bony fusion and no  residual osseous stenosis.  L4-L5: Discectomy with completed bony fusion and no residual osseous stenosis.  L5-S1:Advanced disc narrowing, likely progressed after bilateral laminotomy for microdiscectomy when compared to preoperative MRI. As seen on previous MRI, disc narrowing and foraminal bulging causes bilateral L5 impingement. There is no indication of postoperative hematoma, although evaluation the canal is limited by bony and metallic attenuation. 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  IMPRESSION: 1. No acute findings post recent L5-S1 microdiscectomy. 2. Narrowing and bulging of the remaining L5-S1 disc causes unchanged bilateral foraminal stenosis with L5 impingement greater on the right. 3. L3-4 and L4-5 discectomies with solid bony fusion.  Electronically Signed: By: Monte Fantasia M.D. On: 08/12/2014 16:24    Review of Systems  HENT: Negative.   Eyes: Negative.   Respiratory: Negative.   Cardiovascular: Negative.   Gastrointestinal: Negative.   Genitourinary: Negative.   Musculoskeletal: Positive for back pain.  Neurological: Positive for tingling, sensory change, focal weakness and weakness.  Psychiatric/Behavioral: Negative.     There were no vitals taken for this visit. Physical Exam  Constitutional: She is oriented to person, place, and time. She appears well-developed and well-nourished.  HENT:  Head: Normocephalic and atraumatic.  Eyes: Conjunctivae and EOM are normal. Pupils are equal, round, and reactive to light.  Neck: Normal range of motion. Neck supple.  Cardiovascular: Normal rate and regular rhythm.   Respiratory: Effort normal and breath sounds normal.  Neurological: She is alert and oriented to person, place, and time.  Weakness of left lower extremity in tibialis anterior and gastroc groups 4-5  Skin: Skin is warm and dry.  Psychiatric: She has a normal mood and affect. Her behavior is normal. Judgment and thought content normal.      Assessment/Plan L5-S1 instability status post discectomy at L5-S1 for herniated nucleus pulposus with bilateral pars defects. Severe left lumbar radiculopathy.  Posterior lumbar interbody arthrodesis at L5-S1 with pedicle screw fixation of L5-S1.  Aria Jarrard J 08/13/2014, 9:31 AM

## 2014-08-13 NOTE — Anesthesia Postprocedure Evaluation (Signed)
Anesthesia Post Note  Patient: Catherine [name]  Procedure(s) Performed: Procedure(s) (LRB): POSTERIOR LUMBAR INTERBODY FUSION  LUMBAR FIVE- SACRAL ONE (N/A)  Anesthesia type: general  Patient location: PACU  Post pain: Pain level controlled  Post assessment: Patient's Cardiovascular Status Stable  Last Vitals:  Filed Vitals:   08/13/14 1700  BP: 104/59  Pulse: 55  Temp: 36.2 C  Resp: 12    Post vital signs: Reviewed and stable  Level of consciousness: sedated  Complications: No apparent anesthesia complications

## 2014-08-13 NOTE — Progress Notes (Signed)
Pt reports that pediatric tube is required for intubation.

## 2014-08-13 NOTE — Progress Notes (Signed)
Patient ID: Catherine [name], female   DOB: [birth year], 69 y.o.   MRN: [id number] Postop check Motor function is grossly intact Reasonably comfortable with moderate back pain No obvious leg pain at this time Mobilize as tolerated in a.m.

## 2014-08-13 NOTE — Transfer of Care (Signed)
Immediate Anesthesia Transfer of Care Note  Patient: Catherine [name]  Procedure(s) Performed: Procedure(s): POSTERIOR LUMBAR INTERBODY FUSION  LUMBAR FIVE- SACRAL ONE (N/A)  Patient Location: PACU  Anesthesia Type:General  Level of Consciousness: awake, alert  and oriented  Airway & Oxygen Therapy: Patient connected to face mask oxygen  Post-op Assessment: Report given to RN  Post vital signs: stable  Last Vitals:  Filed Vitals:   08/13/14 1056  BP: 117/75  Pulse: 80  Temp: 36.8 C  Resp: 18    Complications: No apparent anesthesia complications

## 2014-08-13 NOTE — Progress Notes (Signed)
Pt arrived to 4N24. Alert and oriented x4. Pt requests to have pain medication after her family leaves. Pt oriented to room. Call bell within reach. Will continue to monitor.

## 2014-08-13 NOTE — Op Note (Signed)
Date of surgery: 02/12/2014 Preoperative diagnosis: Spondylolysis L5-S1 with acute left-sided radiculopathy status post discectomy 2 days ago L5-S1 status post arthrodesis L3-L5 4 years ago Postoperative diagnosis: Spondylolysis L5-S1 with acute left-sided radiculopathy status post discectomy L5-S1 2 days ago, status post arthrodesis L3-L5 4 years ago Procedure: Laminectomy L5 with removal of pars defects and complete facetectomies L5-S1 posterior lumbar interbody arthrodesis with peek spacers local autograft and allograft L5-S1, 8 mm tall 28 mm long 12 lordotic spacers. Posterior lateral arthrodesis with local autograft and allograft L5-S1, pedicle screw fixation L5-S1 with side connectors being used to connect to L5.  Surgeon: Kristeen Miss First assistant: Karie Chimera M.D. Anesthesia: Gen. endotracheal Indications: Catherine [name] is a 69 year old individual who had a discectomy about 2 days ago. this was at L5-S1 and it was done bilaterally. Initially she had good relief but soon developed pain in her left lower extremity that was severe and excruciating. Postoperative CAT scan demonstrates the presence of bilateral pars defects spondylolysis at the L5 vertebrae. Is suspected that she has developed a slight slip at L5-S1 were soft left side. She is advised regarding the need for stabilization surgery is she's had a previous fusion from L3-L5 4 years ago.  Procedure: Patient was brought to the operating room supine on a stretcher. After the smooth induction of general endotracheal anesthesia she was carefully turned prone. The back was prepped with alcohol and DuraPrep after removing the previously placed Dermabond. After draping sterilely the previously made incision was opened using a curved Mayo scissor to release the subcuticular stitches of 30 Vicryls. The lumbar dorsal fascia was opened and the stitches were removed from this region also. The laminectomies were explored and was noted that the  spinous process was markedly loose at the L5 vertebra further mobilization of this using a curette and Leksell rongeur allowed removal of the spinous process and laminar arch. The facets were also removed in a piecemeal fashion exposing the entire superior articular surface of the S1 facet. Then by carefully dissecting the undersurface of the dura could be explored and no further disc herniation was encountered. The L5 nerve root was noted to be snug on the left side as it entered the foramen a result of a slight listhesis follow her of the L5 vertebrae. The foramen was decompressed. The disc space was then entered and a series of curettes and rongeurs was used to relieve more disc material from the ventral aspect this was facilitated by using a disc space spreader on the contralateral side to obtain some distraction of the interspace. The disc space was completely evacuated of all the disc material and the endplates were then curettaged with a toothed curette to remove all cartilaginous endplate material from the inferior margin of L5 and the superior margin of S1. Once all the disc material was removed by working from the right side to the left side and back again the interspace was sized and was felt that a 12 lordotic 28 mm long 8 mm tall spacer would fit best to restore height to the disc space and also lordosis at the L5-S1 site. The laminar bone which was harvested was then cleaned and morcellized and this was packed with 5 mL of osseous cell to increased volume. A total of 9 mL of bone was packed into the interspace and the peek spacers were placed into the interspace under fluoroscopic guidance. Once this was accomplished lateral gutters had been decorticated and the segment of rod just above the L5 pedicle  screw was cleared. There is noted to be a fair amount of very dense tissue surrounding the posterior construct from L3-L5 and is felt that less trauma would be encountered by simply using a side  connectors to the rod interval between L4 and L5. A side step connector was then placed under the rod and this was secured on both sides. Pedicle entry sites were then chosen the sacrum and X 0.5 x 4-5 mm pedicle screws were placed into the sacrum under fluoroscopic visualization the holes were individually sounded then tapped and sounded again to assure that there is no cut out. Then precontoured 50 mm rods were used to connect the left-sided screw and 55 mm rod was used to connect the right-sided screw to the side connector. This was tightened initially in an neutral construct but then a bit of compression was used on the construct to allow for more lordosis. Once this was secured and tightened fully lateral gutters were packed with the remainder of bone for a total of 6 more cc of bone in the L5-S1 intertransverse space and then the final radiographs were obtained. These were noted to be quite adequate. The pads of the L5 and the S1 nerve roots were checked to make sure that there are free and clear hemostasis and the soft tissues was obtained and then the lumbar dorsal fascia was reapproximated with #1 Vicryls in 20 Vicryls using a 17 is tissues and 30 Vicodin close subcuticular skin. Blood loss is estimated at no more than 150 mL. Patient tolerated procedure well is returned to recovery room stable condition.

## 2014-08-13 NOTE — Anesthesia Preprocedure Evaluation (Addendum)
Anesthesia Evaluation  Patient identified by MRN, date of birth, ID band Patient awake  General Assessment Comment:History noted. CE  Airway Mallampati: I  TM Distance: >3 FB Neck ROM: Full    Dental  (+) Teeth Intact, Dental Advisory Given   Pulmonary neg pulmonary ROS,  breath sounds clear to auscultation        Cardiovascular negative cardio ROS  Rhythm:Regular     Neuro/Psych negative neurological ROS  negative psych ROS   GI/Hepatic negative GI ROS, Neg liver ROS,   Endo/Other    Renal/GU negative Renal ROS     Musculoskeletal negative musculoskeletal ROS (+) Arthritis -,   Abdominal (+)  Abdomen: soft. Bowel sounds: normal.  Peds  Hematology negative hematology ROS (+)   Anesthesia Other Findings   Reproductive/Obstetrics negative OB ROS                          Anesthesia Physical Anesthesia Plan  ASA: II  Anesthesia Plan: General   Post-op Pain Management:    Induction: Intravenous  Airway Management Planned: Oral ETT  Additional Equipment:   Intra-op Plan:   Post-operative Plan: Extubation in OR  Informed Consent: I have reviewed the patients History and Physical, chart, labs and discussed the procedure including the risks, benefits and alternatives for the proposed anesthesia with the patient or authorized representative who has indicated his/her understanding and acceptance.   Dental advisory given  Plan Discussed with: CRNA, Anesthesiologist and Surgeon  Anesthesia Plan Comments:         Anesthesia Quick Evaluation

## 2014-08-14 MED ORDER — DIAZEPAM 2 MG PO TABS
2.0000 mg | ORAL_TABLET | Freq: Four times a day (QID) | ORAL | Status: DC | PRN
  Administered 2014-08-14 – 2014-08-15 (×3): 2 mg via ORAL
  Filled 2014-08-14 (×3): qty 1

## 2014-08-14 MED ORDER — FLEET ENEMA 7-19 GM/118ML RE ENEM
1.0000 | ENEMA | Freq: Once | RECTAL | Status: AC
  Administered 2014-08-14: 1 via RECTAL
  Filled 2014-08-14: qty 1

## 2014-08-14 MED ORDER — ZOLPIDEM TARTRATE 5 MG PO TABS
10.0000 mg | ORAL_TABLET | Freq: Every day | ORAL | Status: DC
  Administered 2014-08-15: 10 mg via ORAL
  Filled 2014-08-14: qty 2

## 2014-08-14 NOTE — Progress Notes (Signed)
Orthopedic Tech Progress Note Patient Details:  Catherine [name] [birth year] [id number] Called in bio-tech brace order; spoke with [name] Patient ID: Catherine [name], female   DOB: Dec 18, 1945, 69 y.o.   MRN: [id number]   [name] 08/14/2014, 11:21 AM

## 2014-08-14 NOTE — Progress Notes (Signed)
Utilization Review completed. Mica Releford RN BSN CM 

## 2014-08-14 NOTE — Progress Notes (Signed)
Orthopedic Tech Progress Note Patient Details:  Catherine [name] [birth year] [id number] Brace order completed by [employer] vendor. Patient ID: Catherine [name], female   DOB: 1945/02/17, 69 y.o.   MRN: [id number]   Braulio Bosch 08/14/2014, 3:06 PM

## 2014-08-14 NOTE — Evaluation (Signed)
Occupational Therapy Evaluation Patient Details Name: DAVETTA OLLIFF MRN: [id number] DOB: 02-24-1945 Today's Date: 08/14/2014    History of Present Illness readmitted to undergo surgical arthrodesis with posterior lumbar interbody technique and pedicle screw fixation of S1 to the L3-L5 construct.itted to undergo surgical arthrodesis with posterior lumbar interbody technique and pedicle screw fixation of S1 to the L3-L5 construct.   Clinical Impression   Eval limited due to awaiting Aspen back brace. Pt requesting to use the bathroom. Pt required cues to maintain back precautions, although able to verbalize and use correct log roll technique to get OOB. Pt educated on ADL A/E for LB ADLs for home use. Asked pt's nurse about [place] for pt and pt's nurse Santiago Glad stated that she has already called ortho tech for brace. Pt would would benefit from acute OT services to maximize level of function and safety    Follow Up Recommendations  No OT follow up;Supervision - Intermittent    Equipment Recommendations  Other (comment) (reacher, LH sponge)    Recommendations for Other Services PT consult     Precautions / Restrictions Precautions Precautions: Back;Fall Precaution Booklet Issued: No Precaution Comments: pt re educated on back precautions Required Braces or Orthoses:  (awaiting Aspen brace) Restrictions Weight Bearing Restrictions: No      Mobility Bed Mobility Overal bed mobility: Needs Assistance Bed Mobility: Rolling;Sidelying to Sit;Sit to Supine;Sit to Sidelying Rolling: Supervision Sidelying to sit: Supervision   Sit to supine: Supervision Sit to sidelying: Supervision General bed mobility comments: using log roll technique  Transfers Overall transfer level: Needs assistance Equipment used: None Transfers: Sit to/from Stand           General transfer comment: assistd pt to bathroom only, still awaiting Aspen brace    Balance Overall balance assessment:  No apparent balance deficits (not formally assessed)                                          ADL       Grooming: Standing;Supervision/safety;Wash/dry hands;Set up                   Toilet Transfer: Min guard;Comfort height toilet;RW (cues to maintain back precautions)   Toileting- Clothing Manipulation and Hygiene: Supervision/safety;Sit to/from stand Toileting - Clothing Manipulation Details (indicate cue type and reason): cues to maintina back precautions     Functional mobility during ADLs: Min guard General ADL Comments: Pt educated on LB ADLs using A/E. Pt does not have Aspen brace at this time, evaluation limited     Vision  no change from baseline   Perception Perception Perception Tested?: No   Praxis Praxis Praxis tested?: Not tested    Pertinent Vitals/Pain Pain Assessment: 0-10 Pain Score: 5  Pain Location: suigical area Pain Descriptors / Indicators: Aching;Sore Pain Intervention(s): Repositioned;Monitored during session     Hand Dominance Right   Extremity/Trunk Assessment Upper Extremity Assessment Upper Extremity Assessment: Overall WFL for tasks assessed   Lower Extremity Assessment Lower Extremity Assessment: Defer to PT evaluation   Cervical / Trunk Assessment Cervical / Trunk Assessment: Normal   Communication Communication Communication: No difficulties   Cognition Arousal/Alertness: Awake/alert Behavior During Therapy: WFL for tasks assessed/performed Overall Cognitive Status: Within Functional Limits for tasks assessed                     General Comments  pt pleasant and cooperative                 Home Living Family/patient expects to be discharged to:: Private residence Living Arrangements: Spouse/significant other Available Help at Discharge: Family Type of Home: House Home Access: Stairs to enter     Home Layout: One level     Bathroom Shower/Tub: Radiographer, therapeutic: Davis: Environmental consultant - 2 wheels;Adaptive equipment Adaptive Equipment: Reacher        Prior Functioning/Environment Level of Independence: Independent             OT Diagnosis: Acute pain   OT Problem List: Decreased knowledge of use of DME or AE;Decreased activity tolerance;Pain   OT Treatment/Interventions: Self-care/ADL training;Therapeutic exercise;Therapeutic activities;DME and/or AE instruction    OT Goals(Current goals can be found in the care plan section) Acute Rehab OT Goals Patient Stated Goal: go home and go to [place] in september OT Goal Formulation: With patient Time For Goal Achievement: 08/21/14 Potential to Achieve Goals: Good ADL Goals Pt Will Perform Grooming: with set-up;standing;with modified independence Pt Will Perform Lower Body Bathing: with caregiver independent in assisting;with adaptive equipment;with min assist;sitting/lateral leans;sit to/from stand Pt Will Perform Lower Body Dressing: with min assist;with caregiver independent in assisting;with adaptive equipment;sit to/from stand Pt Will Transfer to Toilet: with supervision;with modified independence;regular height toilet;grab bars Pt Will Perform Toileting - Clothing Manipulation and hygiene: with supervision;sit to/from stand;with adaptive equipment  OT Frequency: Min 2X/week   Barriers to D/C:    none                     End of Session Nurse Communication: Mobility status;Other (comment) (still awaiting Aspen brace)  Activity Tolerance: Patient tolerated treatment well Patient left: in bed;with call bell/phone within reach   Time: 1029-1058 OT Time Calculation (min): 29 min Charges:  OT General Charges $OT Visit: 1 Procedure OT Evaluation $Initial OT Evaluation Tier I: 1 Procedure OT Treatments $Self Care/Home Management : 8-22 mins G-Codes:    Britt Bottom 08/14/2014, 1:01 PM

## 2014-08-14 NOTE — Progress Notes (Signed)
Patient ID: Catherine [name], female   DOB: 1945/11/19, 69 y.o.   MRN: [id number] Afeb, vss Pain much better than pre op. No neuro issues Will increase activity, and see how she does re d/c.

## 2014-08-14 NOTE — Evaluation (Signed)
Physical Therapy Evaluation Patient Details Name: Catherine [name] MRN: [id number] DOB: [birth year] Today's Date: 08/14/2014   History of Present Illness  readmitted to undergo surgical arthrodesis with posterior lumbar interbody technique and pedicle screw fixation of S1 to the L3-L5 construct.itted to undergo surgical arthrodesis with posterior lumbar interbody technique and pedicle screw fixation of S1 to the L3-L5 construct.  Clinical Impression  Patient is functioning at supervision level with all mobility and gait.  Reviewed precautions with patient and husband.  All education completed. No further acute PT needs identified - PT will sign off.   Encouraged patient to ambulate in hallway with nursing and family.    Follow Up Recommendations No PT follow up;Supervision for mobility/OOB    Equipment Recommendations  None recommended by PT    Recommendations for Other Services       Precautions / Restrictions Precautions Precautions: Back Precaution Booklet Issued: Yes (comment) Precaution Comments: Reviewed education on back precautions and information Required Braces or Orthoses: Spinal Brace Spinal Brace: Lumbar corset;Applied in sitting position Restrictions Weight Bearing Restrictions: No      Mobility  Bed Mobility Overal bed mobility: Needs Assistance Bed Mobility: Rolling;Sidelying to Sit;Sit to Sidelying Rolling: Supervision Sidelying to sit: Supervision     Sit to sidelying: Supervision General bed mobility comments: Reviewed log rolling technique.  Practiced with bed flat and no rail.  Transfers Overall transfer level: Needs assistance Equipment used: None Transfers: Sit to/from Stand Sit to Stand: Supervision         General transfer comment: Verbal cues for technique.  Able to complete with no physical assist.  Ambulation/Gait Ambulation/Gait assistance: Supervision Ambulation Distance (Feet): 600 Feet Assistive device: None Gait  Pattern/deviations: Step-through pattern;Decreased stride length Gait velocity: Decreased Gait velocity interpretation: Below normal speed for age/gender General Gait Details: Patient demonstrates good gait pattern and balance.  Stairs            Wheelchair Mobility    Modified Rankin (Stroke Patients Only)       Balance Overall balance assessment: No apparent balance deficits (not formally assessed)                                           Pertinent Vitals/Pain Pain Assessment: 0-10 Pain Score: 5  Pain Location: Back Pain Descriptors / Indicators: Aching;Sore Pain Intervention(s): Monitored during session;Repositioned    Home Living Family/patient expects to be discharged to:: Private residence Living Arrangements: Spouse/significant other Available Help at Discharge: Family;Available 24 hours/day Type of Home: House Home Access: Level entry     Home Layout: Two level;Able to live on main level with bedroom/bathroom Home Equipment: Gilford Rile - 2 wheels      Prior Function Level of Independence: Independent         Comments: Very active     Hand Dominance   Dominant Hand: Right    Extremity/Trunk Assessment   Upper Extremity Assessment: Overall WFL for tasks assessed           Lower Extremity Assessment: Overall WFL for tasks assessed         Communication   Communication: No difficulties  Cognition Arousal/Alertness: Awake/alert Behavior During Therapy: WFL for tasks assessed/performed;Restless Overall Cognitive Status: Within Functional Limits for tasks assessed                      General Comments General comments (skin  integrity, edema, etc.): Encouraged patient to start slowly with mobility and progress as tolerated.  Encouraged patient to talk with MD regarding driving and her planned trip overseas.    Exercises        Assessment/Plan    PT Assessment Patent does not need any further PT services   PT Diagnosis Acute pain;Abnormality of gait   PT Problem List    PT Treatment Interventions     PT Goals (Current goals can be found in the Care Plan section) Acute Rehab PT Goals Patient Stated Goal: go home and go to [place] in september PT Goal Formulation: All assessment and education complete, DC therapy    Frequency     Barriers to discharge        Co-evaluation               End of Session Equipment Utilized During Treatment: Back brace Activity Tolerance: Patient tolerated treatment well Patient left: in chair;with call bell/phone within reach;with family/visitor present Nurse Communication: Mobility status (Encouraged ambulation in hallway with nursing/family)         Time: 0814-4818 PT Time Calculation (min) (ACUTE ONLY): 27 min   Charges:   PT Evaluation $Initial PT Evaluation Tier I: 1 Procedure PT Treatments $Gait Training: 8-22 mins   PT G CodesDespina Pole 2014/08/20, 7:33 PM Carita Pian. Sanjuana Kava, [place] Pager [phone]

## 2014-08-15 NOTE — Progress Notes (Signed)
Occupational Therapy Treatment Patient Details Name: Catherine [name] MRN: [id number] DOB: Aug 04, 1945 Today's Date: 08/15/2014    History of present illness readmitted to undergo surgical arthrodesis with posterior lumbar interbody technique and pedicle screw fixation of S1 to the L3-L5 construct.itted to undergo surgical arthrodesis with posterior lumbar interbody technique and pedicle screw fixation of S1 to the L3-L5 construct.   OT comments  Pt. Has met all goals and is ready to d/c from OT. Pt. Is an agreement and states that her husband will A as needed. Discussed back precautions in regards to ADLs and pt. Was ed on use of shower brush and toilet aid to adhere to precautions. No further OT is need at this time.   Follow Up Recommendations  No OT follow up    Equipment Recommendations       Recommendations for Other Services      Precautions / Restrictions Precautions Precautions: Back Precaution Booklet Issued: Yes (comment) Precaution Comments:  (reveiwed) Required Braces or Orthoses: Spinal Brace Spinal Brace: Lumbar corset Restrictions Weight Bearing Restrictions: No       Mobility Bed Mobility     Rolling: Modified independent (Device/Increase time) Sidelying to sit: Modified independent (Device/Increase time)   Sit to supine: Modified independent (Device/Increase time) Sit to sidelying: Modified independent (Device/Increase time)    Transfers Overall transfer level: Modified independent     Sit to Stand: Modified independent (Device/Increase time)              Balance                                   ADL       Grooming: Wash/dry hands;Oral care;Modified independent               Lower Body Dressing: Modified independent   Toilet Transfer: Modified Independent   Toileting- Clothing Manipulation and Hygiene: Modified independent       Functional mobility during ADLs: Modified independent General ADL Comments: Pt. ed  on back precuations and ADL      Vision                     Perception     Praxis      Cognition   Behavior During Therapy: WFL for tasks assessed/performed Overall Cognitive Status: Within Functional Limits for tasks assessed                       Extremity/Trunk Assessment               Exercises     Shoulder Instructions       General Comments      Pertinent Vitals/ Pain       Pain Assessment: 0-10 Pain Score: 6  Pain Location:  (back) Pain Descriptors / Indicators: Aching Pain Intervention(s): RN gave pain meds during session  Home Living                                          Prior Functioning/Environment              Frequency       Progress Toward Goals  OT Goals(current goals can now be found in the care plan section)  Progress towards OT goals: Goals met/education completed, patient discharged  from OT  ADL Goals Pt Will Perform Grooming: with modified independence Pt Will Perform Lower Body Bathing: with modified independence Pt Will Perform Lower Body Dressing: with modified independence Pt Will Transfer to Toilet: with modified independence Pt Will Perform Toileting - Clothing Manipulation and hygiene: with modified independence  Plan All goals met and education completed, patient discharged from OT services    Co-evaluation                 End of Session Equipment Utilized During Treatment: Back brace   Activity Tolerance Patient tolerated treatment well   Patient Left in bed;with call bell/phone within reach   Nurse Communication          Time: 0800-0859 OT Time Calculation (min): 59 min  Charges: OT General Charges $OT Visit: 1 Procedure OT Evaluation $Initial OT Evaluation Tier I: 1 Procedure OT Treatments $Self Care/Home Management : 53-67 mins  Ruffin Lada 08/15/2014, 9:29 AM

## 2014-08-15 NOTE — Progress Notes (Signed)
DC instructions given to patient. All questions answered. Patient in [facility] escorted to lobby where friend is to transport home in private vehicle.

## 2014-08-15 NOTE — Discharge Summary (Signed)
  Physician Discharge Summary  Patient ID: LILIE VEZINA MRN: [id number] DOB/AGE: March 07, 1945 69 y.o.  Admit date: 08/13/2014 Discharge date: 08/15/2014  Admission Diagnoses:  Discharge Diagnoses:  Active Problems:   Spondylolysis, lumbosacral   Discharged Condition: good  Hospital Course: Surgery 5 days ago for herniated disc. After surgery, developed severe pain and noted to have pars defects. Brought back to OR where underwent L5S1 fusion with screws. Did very well with marked improvement in pain. Home pod 2, specific instructions given.  Consults: None  Significant Diagnostic Studies: none  Treatments: surgery: L5S1 plif  Discharge Exam: Blood pressure 112/64, pulse 78, temperature 98.1 F (36.7 C), temperature source Oral, resp. rate 20, height 5\' 2"  (1.575 m), weight 51.256 kg (113 lb), SpO2 100 %. Incision/Wound:clean and dry; no new neuro issues  Disposition: 01-Home or Self Care     Medication List    ASK your doctor about these medications        BOTOX IJ  Inject as directed. 1.4 ml     celecoxib 200 MG capsule  Commonly known as:  CELEBREX  Take 200 mg by mouth daily.     cholecalciferol 1000 UNITS tablet  Commonly known as:  VITAMIN D  Take 5,000 Units by mouth daily.     dexamethasone 4 MG tablet  Commonly known as:  DECADRON  Take 4 mg by mouth 3 (three) times daily. 4 Day Supply     diazepam 5 MG tablet  Commonly known as:  VALIUM  Take 1 tablet (5 mg total) by mouth every 6 (six) hours as needed for muscle spasms.     Estradiol-Norethindrone Acet 0.5-0.1 MG per tablet  Take 1 tablet by mouth daily.     fish oil-omega-3 fatty acids 1000 MG capsule  Take 2 g by mouth daily.     HYDROcodone-acetaminophen 5-325 MG per tablet  Commonly known as:  NORCO/VICODIN  Take 1-2 tablets by mouth every 4 (four) hours as needed for moderate pain.     multivitamin capsule  Take 1 capsule by mouth daily.     VITAMIN B-12 PO  Take 1 tablet by mouth  daily.     vitamin C 100 MG tablet  Take 100 mg by mouth daily.     zolpidem 10 MG tablet  Commonly known as:  AMBIEN  Take 1 tablet by mouth daily.         At home rest most of the time. Get up 9 or 10 times each day and take a 15 or 20 minute walk. No riding in the car and to your first postoperative appointment. If you have neck surgery you may shower from the chest down starting on the third postoperative day. If you had back surgery he may start showering on the third postoperative day with saran wrap wrapped around your incisional area 3 times. After the shower remove the saran wrap. Take pain medicine as needed and other medications as instructed. Call my office for an appointment.  SignedFaythe Ghee, MD 08/15/2014, 10:41 AM

## 2014-08-16 MED FILL — Sodium Chloride IV Soln 0.9%: INTRAVENOUS | Qty: 1000 | Status: AC

## 2014-08-16 MED FILL — Heparin Sodium (Porcine) Inj 1000 Unit/ML: INTRAMUSCULAR | Qty: 30 | Status: AC

## 2014-09-02 ENCOUNTER — Ambulatory Visit
Admission: RE | Admit: 2014-09-02 | Discharge: 2014-09-02 | Disposition: A | Discharge status: Home / Self Care | Payer: 59 | Source: Ambulatory Visit | Attending: Neurological Surgery | Admitting: Neurological Surgery

## 2014-09-02 ENCOUNTER — Other Ambulatory Visit: Payer: Self-pay | Admitting: Neurological Surgery

## 2014-09-02 ENCOUNTER — Telehealth: Payer: Self-pay

## 2014-09-02 DIAGNOSIS — M545 Low back pain: Secondary | ICD-10-CM

## 2014-09-02 NOTE — Telephone Encounter (Signed)
Patient informed. Transferred to appts. To r/s.

## 2014-09-02 NOTE — Telephone Encounter (Signed)
Patient had abnormal pap smear on 07/16/14. Atypical glandular cells with neg HR HPV.  She has C&B scheduled with you for 09/06/14 but she called today because she had two back surgeries done 2 weeks ago and is still having some problems. She said surgeon told her unless this was urgent/life threatening that he thought she should delay if for 3-4 weeks.   Ok to wait?

## 2014-09-02 NOTE — Telephone Encounter (Signed)
It should be no problem we could do it in the next 2-3 months

## 2014-09-06 ENCOUNTER — Ambulatory Visit: Payer: 59 | Admitting: Gynecology

## 2014-09-06 ENCOUNTER — Other Ambulatory Visit: Payer: 59

## 2014-11-04 ENCOUNTER — Encounter: Payer: Self-pay | Admitting: Gynecology

## 2014-11-04 ENCOUNTER — Ambulatory Visit (INDEPENDENT_AMBULATORY_CARE_PROVIDER_SITE_OTHER): Payer: 59 | Admitting: Gynecology

## 2014-11-04 VITALS — BP 114/68

## 2014-11-04 DIAGNOSIS — R87619 Unspecified abnormal cytological findings in specimens from cervix uteri: Secondary | ICD-10-CM | POA: Diagnosis not present

## 2014-11-04 HISTORY — DX: Unspecified abnormal cytological findings in specimens from cervix uteri: R87.619

## 2014-11-04 NOTE — Progress Notes (Signed)
   Patient is a 69 year old who presented to the office today for colposcopic evaluation as a result of her recent Pap smear at time of her annual exam demonstrated the following:  Diagnosis ATYPICAL GLANDULAR CELLS, SEE COMMENT. 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 Patient is currently on [place] 0.5/0.1,states has helped arthritis when trying to come off does have increased arthritic pain and reports no vaginal bleeding.  Patient was counseled and underwent a detail colposcopic evaluation of the external genitalia, perineum and perirectal region were no lesions seen. The speculum was introduced into the vagina. A systematic inspection of the entire vagina and fornix was undertaken with no lesions seen. Endocervical speculum was utilized and a small acetowhite area was seen at the 7:00 position. The transformation zone was completely visualized. An endometrial biopsy and an ECC and cervical biopsy from the 7:00 position were obtained and Monsel solution was used for hemostasis. The following pictured pixie area noted on colposcopy:  Physical Exam  Genitourinary:     the above area was biopsied along with ECC and endometrial biopsy.  Assessment/plan: Patient with atypical glandular cell on recent Pap smear. Small acetowhite area noted rule out CIN-1. If the biopsies all normal live recommended follow-up with a Pap smear HPV screen in 6 months.

## 2014-11-04 NOTE — Patient Instructions (Signed)
COLPOSCOPY POST-PROCEDURE INSTRUCTIONS  1. You may take Ibuprofen, Aleve or Tylenol for cramping if needed.  2. If Monsel's solution was used, you will have a black discharge.  3. Light bleeding is normal.  If bleeding is heavier than your period, please call.  4. Put nothing in your vagina until the bleeding or discharge stops (usually 2 or3 days).  5. We will call you within one week with biopsy results or discuss the results at your follow-up appointment if needed. 6.  

## 2014-11-11 ENCOUNTER — Encounter: Payer: Self-pay | Admitting: Gynecology

## 2014-11-11 ENCOUNTER — Ambulatory Visit (INDEPENDENT_AMBULATORY_CARE_PROVIDER_SITE_OTHER): Payer: 59 | Admitting: Gynecology

## 2014-11-11 ENCOUNTER — Telehealth: Payer: Self-pay

## 2014-11-11 VITALS — BP 118/68

## 2014-11-11 DIAGNOSIS — R87619 Unspecified abnormal cytological findings in specimens from cervix uteri: Secondary | ICD-10-CM | POA: Diagnosis not present

## 2014-11-11 DIAGNOSIS — N898 Other specified noninflammatory disorders of vagina: Secondary | ICD-10-CM | POA: Diagnosis not present

## 2014-11-11 DIAGNOSIS — Z01818 Encounter for other preprocedural examination: Secondary | ICD-10-CM | POA: Diagnosis not present

## 2014-11-11 LAB — WET PREP FOR TRICH, YEAST, CLUE
Clue Cells Wet Prep HPF POC: NONE SEEN
TRICH WET PREP: NONE SEEN
Yeast Wet Prep HPF POC: NONE SEEN

## 2014-11-11 MED ORDER — CLINDAMYCIN PHOSPHATE 2 % VA CREA: 1.0000 | TOPICAL_CREAM | Freq: Every day | VAGINAL | Status: DC

## 2014-11-11 NOTE — Progress Notes (Signed)
Catherine [name] is an 69 y.o. female to discuss her recent pathology report as well as for preoperative consultation. Her history is as follows:  Patient's recent Pap smear time of her annual exam demonstrated the following: Diagnosis ATYPICAL GLANDULAR CELLS, SEE COMMENT. 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 Patient is currently on [place] 0.5/0.1,states has helped arthritis when trying to come off does have increased arthritic pain and reports no vaginal bleeding.  Patient was counseled and underwent a detail colposcopic evaluation of the external genitalia, perineum and perirectal region were no lesions seen. The speculum was introduced into the vagina. A systematic inspection of the entire vagina and fornix was undertaken with no lesions seen. Endocervical speculum was utilized and a small acetowhite area was seen at the 7:00 position. The transformation zone was completely visualized. An endometrial biopsy and an ECC and cervical biopsy from the 7:00 position were obtained and Monsel solution was used for hemostasis. The following pictured pixie area noted on colposcopy:  Physical Exam  Genitourinary:    Colposcopic directed biopsy demonstrated the following: Diagnosis 1. Endocervix, curettage - DETACHED BENIGN SQUAMOUS CELLS ADMIXED WITH MUCIN AND INFLAMMATION. - EXTREMELY SCANT (MINUTE) BENIGN ENDOCERVICAL MUCOSAL FRAGMENTS. - NO DYSPLASIA, ATYPIA OR MALIGNANCY IDENTIFIED. - SEE COMMENT. 2. Endometrium, curettage - SCANT BENIGN ENDOCERVICAL/LOWER UTERINE SEGMENT MUCOSAL FRAGMENTS ADMIXED WITH ABUNDANT MUCIN. - NO ATYPIA OR MALIGNANCY IDENTIFIED. - SEE COMMENT. 3. Cervix, biopsy, 7:00 o'clock - BENIGN SQUAMOUS MUCOSA. - NO DYSPLASIA OR MALIGNANCY IDENTIFIED. - TRANSFORMATION ZONE MUCOSA IS NOT PRESENT. Microscopic  Comment 1. Although there is no dysplasia, atypia or malignancy identified within the endocervical curettage specimen, the paucity of endocervical cells in the specimen makes it essentially nondiagnostic with regards to evaluation of the endocervix for a glandular lesion. 2. Although some of the mucosal fragments in the endometrial curettage specimen may be from the lower uterine segment, well sampled endometrial tissue is not present and the specimen is essentially nondiagnostic for evaluation of endometrial pathology. 3. Dr. Saralyn Pilar has seen the 7 o'clock cervical biopsy in consultation with agreement  Patient will be scheduled to undergo diagnostic hysteroscopy, fractionated endometrial biopsy and ECC and cold knife cone biopsy  Pertinent Gynecological History: Menses: post-menopausal Bleeding: None Contraception: none DES exposure: unknown Blood transfusions: none Sexually transmitted diseases: no past history Previous GYN Procedures: Vaginal delivery, LEEP cervical conization  Last mammogram: normal Date: 2016 Last pap: abnormal: See above Date: 2016 OB History: G 4, P 2   Menstrual History: Menarche age: 35 No LMP recorded. Patient is postmenopausal.    Past Medical History  Diagnosis Date  . Fever blister   . Spasmodic dysphonia   . Difficult intubation     needs pediatric equipment  . Arthritis   . Insomnia     Past Surgical History  Procedure Laterality Date  . Tubal ligation    . Knee surgery      Rt-95,Lft.-98,Replacement-09  . Rotator cuff repair  2004  . Cervical biopsy  w/ loop electrode excision  2006  . Colposcopy    . Back surgery      Fusion  . Tonsillectomy    . Cataract extraction w/ intraocular lens  implant, bilateral    . Colonoscopy    . Lumbar laminectomy/decompression microdiscectomy Bilateral 08/10/2014    Procedure: Bilateral Lumbar five-Sacral one Diskectomy;  Surgeon: Kristeen Miss, MD;  Location: [facility];  Service: Neurosurgery;   Laterality: Bilateral;  Bilateral L5-S1 Diskectomy    Family History  Problem Relation Age of Onset  . Hypertension Mother   . Dementia Mother   . Hypertension Father   . Diabetes Father   . Heart disease Father     Social History:  reports that she has never smoked. She has never used smokeless tobacco. She reports that she drinks about 6.0 oz of alcohol per week. She reports that she does not use illicit drugs.  Allergies: No Known Allergies   (Not in a hospital admission)  REVIEW OF SYSTEMS: A ROS was performed and pertinent positives and negatives are included in the history.  GENERAL: No fevers or chills. HEENT: No change in vision, no earache, sore throat or sinus congestion. NECK: No pain or stiffness. CARDIOVASCULAR: No chest pain or pressure. No palpitations. PULMONARY: No shortness of breath, cough or wheeze. GASTROINTESTINAL: No abdominal pain, nausea, vomiting or diarrhea, melena or bright red blood per rectum. GENITOURINARY: No urinary frequency, urgency, hesitancy or dysuria. MUSCULOSKELETAL: No joint or muscle pain, no back pain, no recent trauma. DERMATOLOGIC: No rash, no itching, no lesions. ENDOCRINE: No polyuria, polydipsia, no heat or cold intolerance. No recent change in weight. HEMATOLOGICAL: No anemia or easy bruising or bleeding. NEUROLOGIC: No headache, seizures, numbness, tingling or weakness. PSYCHIATRIC: No depression, no loss of interest in normal activity or change in sleep pattern.     Blood pressure 118/68.  Physical Exam:  HEENT:unremarkable Neck:Supple, midline, no thyroid megaly, no carotid bruits Lungs:  Clear to auscultation no rhonchi's or wheezes Heart:Regular rate and rhythm, no murmurs or gallops Breast Exam: Done and recent exam reported been normal Abdomen: Soft nontender no rebound or guarding Pelvic:BUS within normal limits Vagina: Mild atrophic changes Cervix: As described above Uterus: Anteverted no palpable mass or  tenderness Adnexa: No palpable mass or tenderness Extremities: No cords, no edema Rectal: Not done  Results for orders placed or performed in visit on 11/11/14 (from the past 24 hour(s))  WET PREP FOR TRICH, YEAST, CLUE     Status: Abnormal   Collection Time: 11/11/14  8:46 AM  Result Value Ref Range   Yeast Wet Prep HPF POC NONE SEEN NONE SEEN   Trich, Wet Prep NONE SEEN NONE SEEN   Clue Cells Wet Prep HPF POC NONE SEEN NONE SEEN   WBC, Wet Prep HPF POC MOD (A) NONE SEEN   Narrative   Performed at:  [facility]                [place] [address], [place] [address]    No results found.  Assessment/Plan: 69 year old with Pap smear demonstrated atypical glandular cells favored endocervix. Recent ECC and endometrial biopsy with scant material unable to attain conclusive diagnosis. For this patient patient will be scheduled for diagnostic hysteroscopy, endometrial biopsy, vigorous ECC, cold knife cervical conization. Patient also has informed that she has been on hormone replacement therapy for over 15 years we discussed women's health initiative study was recommended she discontinued altogether. She continues peppermint oral to apply topically when necessary the following risk for surgery were discussed:                        Patient was counseled as to the risk of surgery to include the following:  1. Infection (prohylactic antibiotics will be administered)  2. DVT/Pulmonary Embolism (prophylactic pneumo compression stockings will be used)  3.Trauma to internal organs requiring additional surgical procedure to repair any injury to     Internal organs requiring perhaps additional hospitalization days.  4.Hemmorhage requiring transfusion and blood products which carry risks such as             anaphylactic reaction, hepatitis and AIDS  Patient had received literature information on the procedure scheduled and all her questions were  answered and fully accepts all risk.  From the time of the last cervical biopsy patient stated she was having some vaginal discharge so wet prep was performed had moderate white blood cells and moderate bacteria. She will be prescribed Cleocin vaginal cream to apply daily at bedtime for one week.   [facility] HMD9:18 AMTD@Note :

## 2014-11-11 NOTE — Telephone Encounter (Signed)
That will work

## 2014-11-11 NOTE — Telephone Encounter (Signed)
I called patient to discuss scheduling Hysterscopy, [facility]. My first available date is 12/14/14 but patient will have her grandchildren there for Thanksgiving. I offered her 12/21/14 but her family will still be there. She would like to schedule for 12/28/14 if you think that is okay. She will go a week earlier if you think she should.

## 2014-11-11 NOTE — Patient Instructions (Signed)
Clindamycin vaginal cream What is this medicine? CLINDAMYCIN ([place] sin) is a lincosamide antibiotic. It is used to treat vaginal infections caused by certain bacteria. This medicine may be used for other purposes; ask your health care provider or pharmacist if you have questions. What should I tell my health care provider before I take this medicine? They need to know if you have any of these conditions: -diarrhea -inflammatory bowel disease -kidney or liver disease -stomach problems like colitis -an unusual or allergic reaction to clindamycin, lincomycin, other medicines, foods, dyes or preservatives -pregnant or trying to get pregnant -breast-feeding How should I use this medicine? This medicine is only for use in the vagina. Place in the vagina using the special applicator supplied with the cream. Wash hands before and after use. Fill the applicator with cream. Lie on your back, part and bend your knees. Insert the applicator into the vagina and push the plunger to expel the cream into the vagina. Wash the applicator with warm soapy water and rinse well. Keep this medicine out of the eyes. If you do get any in your eyes rinse out with plenty of cool tap water. Take your medicine at regular intervals. Do not take your medicine more often than directed. Use this medicine for the full course prescribed by your doctor or health care professional, even if you think your condition is better. Do not stop using except on your the advice of your doctor or health care professional. Talk to your pediatrician regarding the use of this medicine in children. Special care may be needed. Overdosage: If you think you have taken too much of this medicine contact a poison control center or emergency room at once. NOTE: This medicine is only for you. Do not share this medicine with others. What if I miss a dose? If you miss a dose, use it as soon as you can. If it is almost time for your next dose, use only  that dose. Do not use double or extra doses. What may interact with this medicine? Interactions are not expected. Do not use any other vaginal products without telling your doctor or health care professional. This list may not describe all possible interactions. Give your health care provider a list of all the medicines, herbs, non-prescription drugs, or dietary supplements you use. Also tell them if you smoke, drink alcohol, or use illegal drugs. Some items may interact with your medicine. What should I watch for while using this medicine? Tell your doctor or health care professional if your symptoms do not start to get better in a few days. Do not use tampons or douches while using this medicine. Do not have sex until you have finished your treatment. Having sex can make the treatment less effective. After you finish treatment, do not use latex condoms for three days. There may still be some medicine in the vagina. This can damage the latex and make the condom less effective at preventing pregnancy. Your clothing may get soiled. To help prevent reinfection, wear freshly washed cotton, not synthetic, underwear. What side effects may I notice from receiving this medicine? Side effects that you should report to your doctor or health care professional as soon as possible: -allergic reactions like skin rash, itching or hives, swelling of the face, lips, or tongue -diarrhea that is watery or severe -fever or chills, sore throat -increased thirst -itching of the vaginal or genital area -pain during sexual intercourse -stomach pain or cramps -thick white vaginal discharge -unusual  bleeding or bruising Side effects that usually do not require medical attention (report to your doctor or health care professional if they continue or are bothersome): -nausea, vomiting This list may not describe all possible side effects. Call your doctor for medical advice about side effects. You may report side effects  to FDA at 1-800-FDA-1088. Where should I keep my medicine? Keep out of the reach of children. Store at room temperature between 20 and 25 degrees C (68 and 77 degrees F). Do not freeze. Throw away any unused medicine after the expiration date. NOTE: This sheet is a summary. It may not cover all possible information. If you have questions about this medicine, talk to your doctor, pharmacist, or health care provider.    2016, Elsevier/Gold Standard. (2012-08-14 16:18:30)

## 2014-12-06 NOTE — H&P (Signed)
Catherine [name] is an 69 y.o. female to discuss her recent pathology report as well as for preoperative consultation. Her history is as follows:  Patient's recent Pap smear time of her annual exam demonstrated the following: Diagnosis ATYPICAL GLANDULAR CELLS, SEE COMMENT. 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 Patient is currently on [place] 0.5/0.1,states has helped arthritis when trying to come off does have increased arthritic pain and reports no vaginal bleeding.  Patient was counseled and underwent a detail colposcopic evaluation of the external genitalia, perineum and perirectal region were no lesions seen. The speculum was introduced into the vagina. A systematic inspection of the entire vagina and fornix was undertaken with no lesions seen. Endocervical speculum was utilized and a small acetowhite area was seen at the 7:00 position. The transformation zone was completely visualized. An endometrial biopsy and an ECC and cervical biopsy from the 7:00 position were obtained and Monsel solution was used for hemostasis. The following pictured pixie area noted on colposcopy:  Physical Exam  Genitourinary:    Colposcopic directed biopsy demonstrated the following: Diagnosis 1. Endocervix, curettage - DETACHED BENIGN SQUAMOUS CELLS ADMIXED WITH MUCIN AND INFLAMMATION. - EXTREMELY SCANT (MINUTE) BENIGN ENDOCERVICAL MUCOSAL FRAGMENTS. - NO DYSPLASIA, ATYPIA OR MALIGNANCY IDENTIFIED. - SEE COMMENT. 2. Endometrium, curettage - SCANT BENIGN ENDOCERVICAL/LOWER UTERINE SEGMENT MUCOSAL FRAGMENTS ADMIXED WITH ABUNDANT MUCIN. - NO ATYPIA OR MALIGNANCY IDENTIFIED. - SEE COMMENT. 3. Cervix, biopsy, 7:00 o'clock - BENIGN SQUAMOUS MUCOSA. - NO DYSPLASIA OR MALIGNANCY IDENTIFIED. - TRANSFORMATION ZONE MUCOSA IS NOT PRESENT. Microscopic  Comment 1. Although there is no dysplasia, atypia or malignancy identified within the endocervical curettage specimen, the paucity of endocervical cells in the specimen makes it essentially nondiagnostic with regards to evaluation of the endocervix for a glandular lesion. 2. Although some of the mucosal fragments in the endometrial curettage specimen may be from the lower uterine segment, well sampled endometrial tissue is not present and the specimen is essentially nondiagnostic for evaluation of endometrial pathology. 3. Dr. Saralyn Pilar has seen the 7 o'clock cervical biopsy in consultation with agreement  Patient will be scheduled to undergo diagnostic hysteroscopy, fractionated endometrial biopsy and ECC and cold knife cone biopsy  Pertinent Gynecological History: Menses: post-menopausal Bleeding: None Contraception: none DES exposure: unknown Blood transfusions: none Sexually transmitted diseases: no past history Previous GYN Procedures: Vaginal delivery, LEEP cervical conization  Last mammogram: normal Date: 2016 Last pap: abnormal: See above Date: 2016 OB History: G 4, P 2  Menstrual History: Menarche age: 85 No LMP recorded. Patient is postmenopausal.    Past Medical History  Diagnosis Date  . Fever blister   . Spasmodic dysphonia   . Difficult intubation     needs pediatric equipment  . Arthritis   . Insomnia     Past Surgical History  Procedure Laterality Date  . Tubal ligation    . Knee surgery      Rt-95,Lft.-98,Replacement-09  . Rotator cuff repair  2004  . Cervical biopsy w/ loop electrode excision  2006  . Colposcopy    . Back surgery      Fusion  . Tonsillectomy    . Cataract extraction w/ intraocular lens implant, bilateral    . Colonoscopy    . Lumbar laminectomy/decompression microdiscectomy Bilateral 08/10/2014    Procedure: Bilateral Lumbar five-Sacral one  Diskectomy; Surgeon: Kristeen Miss, MD; Location: [facility];  Service: Neurosurgery; Laterality: Bilateral; Bilateral L5-S1 Diskectomy    Family History  Problem Relation Age of Onset  . Hypertension Mother   . Dementia Mother   . Hypertension Father   . Diabetes Father   . Heart disease Father     Social History:  reports that she has never smoked. She has never used smokeless tobacco. She reports that she drinks about 6.0 oz of alcohol per week. She reports that she does not use illicit drugs.  Allergies: No Known Allergies   (Not in a hospital admission)  REVIEW OF SYSTEMS: A ROS was performed and pertinent positives and negatives are included in the history. GENERAL: No fevers or chills. HEENT: No change in vision, no earache, sore throat or sinus congestion. NECK: No pain or stiffness. CARDIOVASCULAR: No chest pain or pressure. No palpitations. PULMONARY: No shortness of breath, cough or wheeze. GASTROINTESTINAL: No abdominal pain, nausea, vomiting or diarrhea, melena or bright red blood per rectum. GENITOURINARY: No urinary frequency, urgency, hesitancy or dysuria. MUSCULOSKELETAL: No joint or muscle pain, no back pain, no recent trauma. DERMATOLOGIC: No rash, no itching, no lesions. ENDOCRINE: No polyuria, polydipsia, no heat or cold intolerance. No recent change in weight. HEMATOLOGICAL: No anemia or easy bruising or bleeding. NEUROLOGIC: No headache, seizures, numbness, tingling or weakness. PSYCHIATRIC: No depression, no loss of interest in normal activity or change in sleep pattern.     Blood pressure 118/68.  Physical Exam:  HEENT:unremarkable Neck:Supple, midline, no thyroid megaly, no carotid bruits Lungs: Clear to auscultation no rhonchi's or wheezes Heart:Regular rate and rhythm, no murmurs or gallops Breast Exam: Done and recent exam reported been normal Abdomen: Soft nontender no rebound or guarding Pelvic:BUS within normal  limits Vagina: Mild atrophic changes Cervix: As described above Uterus: Anteverted no palpable mass or tenderness Adnexa: No palpable mass or tenderness Extremities: No cords, no edema Rectal: Not done   Lab Results Last 24 Hours    Results for orders placed or performed in visit on 11/11/14 (from the past 24 hour(s))  WET PREP FOR TRICH, YEAST, CLUE Status: Abnormal   Collection Time: 11/11/14 8:46 AM  Result Value Ref Range   Yeast Wet Prep HPF POC NONE SEEN NONE SEEN   Trich, Wet Prep NONE SEEN NONE SEEN   Clue Cells Wet Prep HPF POC NONE SEEN NONE SEEN   WBC, Wet Prep HPF POC MOD (A) NONE SEEN   Narrative   Performed at: [facility]  [place], [place] [address]       Imaging Results (Last 48 hours)    No results found.    Assessment/Plan: 69 year old with Pap smear demonstrated atypical glandular cells favored endocervix. Recent ECC and endometrial biopsy with scant material unable to attain conclusive diagnosis. For this patient patient will be scheduled for diagnostic hysteroscopy, endometrial biopsy, vigorous ECC, cold knife cervical conization. Patient also has informed that she has been on hormone replacement therapy for over 15 years we discussed women's health initiative study was recommended she discontinued altogether. She continues peppermint oral to apply topically when necessary the following risk for surgery were discussed:   Patient was counseled as to the risk of surgery to include the following:  1. Infection (prohylactic antibiotics will be administered)  2. DVT/Pulmonary Embolism (prophylactic pneumo compression stockings will be used)  3.Trauma to internal organs requiring additional surgical procedure to repair any injury to   Internal organs requiring perhaps additional hospitalization days.  4.Hemmorhage  requiring  transfusion and blood products which carry risks such as  anaphylactic reaction, hepatitis and AIDS  Patient had received literature information on the procedure scheduled and all her questions were answered and fully accepts all risk.  From the time of the last cervical biopsy patient stated she was having some vaginal discharge so wet prep was performed had moderate white blood cells and moderate bacteria. She will be prescribed Cleocin vaginal cream to apply daily at bedtime for one week.

## 2014-12-15 NOTE — Patient Instructions (Signed)
Your procedure is scheduled on:  Tuesday, Dec. 6, 2016  Enter through the [employer] [facility] at:  6:00 AM  Pick up the phone at the desk and dial [phone].  Call this number if you have problems the morning of surgery: [phone].  Remember: Do NOT eat food or drink after: Midnight Monday, Dec. 5, 2016 Take these medicines the morning of surgery with a SIP OF WATER: NONE  *Stop taking fish oil at this time  Do NOT wear jewelry (body piercing), metal hair clips/bobby pins, make-up, or nail polish. Do NOT wear lotions, powders, or perfumes.  You may wear deoderant. Do NOT shave for 48 hours prior to surgery. Do NOT bring valuables to the hospital. Contacts, dentures, or bridgework may not be worn into surgery.  Have a responsible adult drive you home and stay with you for 24 hours after your procedure

## 2014-12-20 ENCOUNTER — Other Ambulatory Visit: Payer: Self-pay

## 2014-12-20 ENCOUNTER — Encounter (HOSPITAL_COMMUNITY)
Admission: RE | Admit: 2014-12-20 | Discharge: 2014-12-20 | Disposition: A | Discharge status: Home / Self Care | Payer: 59 | Source: Ambulatory Visit | Attending: Gynecology | Admitting: Gynecology

## 2014-12-20 ENCOUNTER — Encounter (HOSPITAL_COMMUNITY): Payer: Self-pay

## 2014-12-20 DIAGNOSIS — R87619 Unspecified abnormal cytological findings in specimens from cervix uteri: Secondary | ICD-10-CM | POA: Insufficient documentation

## 2014-12-20 DIAGNOSIS — Z01818 Encounter for other preprocedural examination: Secondary | ICD-10-CM | POA: Insufficient documentation

## 2014-12-20 LAB — CBC
HCT: 41.1 % (ref 36.0–46.0)
Hemoglobin: 14.2 g/dL (ref 12.0–15.0)
MCH: 30.1 pg (ref 26.0–34.0)
MCHC: 34.5 g/dL (ref 30.0–36.0)
MCV: 87.1 fL (ref 78.0–100.0)
PLATELETS: 310 10*3/uL (ref 150–400)
RBC: 4.72 MIL/uL (ref 3.87–5.11)
RDW: 12.4 % (ref 11.5–15.5)
WBC: 6.4 10*3/uL (ref 4.0–10.5)

## 2014-12-20 NOTE — Pre-Procedure Instructions (Signed)
Dr. Jillyn Hidden made aware of EKG no new orders at this time.

## 2014-12-21 ENCOUNTER — Telehealth: Payer: Self-pay | Admitting: *Deleted

## 2014-12-21 ENCOUNTER — Telehealth: Payer: Self-pay

## 2014-12-21 DIAGNOSIS — R9431 Abnormal electrocardiogram [ECG] [EKG]: Secondary | ICD-10-CM

## 2014-12-21 NOTE — Telephone Encounter (Signed)
Appointment on 12/24/14 @ 2:30pm with Nahser pt aware

## 2014-12-21 NOTE — Telephone Encounter (Signed)
I called patient and informed her abnormal ECG with pre op labs at [facility].  Dr. Moshe Salisbury recommended cardiology clearance prior to surgery. Appt scheduled with Dr. Acie Fredrickson at [facility] for Friday at 2:30pm. Patient informed.

## 2014-12-23 DIAGNOSIS — T884XXA Failed or difficult intubation, initial encounter: Secondary | ICD-10-CM | POA: Insufficient documentation

## 2014-12-23 DIAGNOSIS — J383 Other diseases of vocal cords: Secondary | ICD-10-CM | POA: Insufficient documentation

## 2014-12-23 DIAGNOSIS — G4700 Insomnia, unspecified: Secondary | ICD-10-CM | POA: Insufficient documentation

## 2014-12-24 ENCOUNTER — Encounter: Payer: Self-pay | Admitting: Cardiovascular Disease

## 2014-12-24 ENCOUNTER — Ambulatory Visit (INDEPENDENT_AMBULATORY_CARE_PROVIDER_SITE_OTHER): Payer: 59 | Admitting: Cardiovascular Disease

## 2014-12-24 VITALS — BP 136/70 | HR 70 | Ht 62.0 in | Wt 109.0 lb

## 2014-12-24 DIAGNOSIS — R9431 Abnormal electrocardiogram [ECG] [EKG]: Secondary | ICD-10-CM | POA: Insufficient documentation

## 2014-12-24 NOTE — Patient Instructions (Signed)
Medication Instructions:  Your physician recommends that you continue on your current medications as directed. Please refer to the Current Medication list given to you today.   Labwork: None Ordered   Testing/Procedures: None Ordered   Follow-Up: Your physician recommends that you schedule a follow-up appointment in: as needed with Dr. Nahser   If you need a refill on your cardiac medications before your next appointment, please call your pharmacy.   Thank you for choosing [facility]! Lynsay Fesperman, RN [phone]    

## 2014-12-24 NOTE — Progress Notes (Signed)
Cardiology Office Note   Date:  12/24/2014   ID:  Catherine [name], DOB 09-Apr-1945, MRN [id number]  PCP:  Horton Finer, MD  Cardiologist:   Thayer Headings, MD   Chief Complaint  Patient presents with  . Abnormal ECG   Problem List  1. Abnormal ECG :    Poor R wave progression    History of Present Illness: Catherine [name]  is a 69 y.o. female who presents for  An abnormal ECG Catherine [name] works out regularly. Has been limited by 2 recent back surgeries.    Slow recovery  So far No CP ,  No dyspnea.   No syncope.  BP is typically very low .    Past Medical History  Diagnosis Date  . Fever blister   . Spasmodic dysphonia   . Difficult intubation     needs pediatric equipment  . Arthritis   . Insomnia   . Abnormal glandular Papanicolaou smear of cervix 11/04/2014  . CIN I (cervical intraepithelial neoplasia I)     LEEP 2006 margins free      negative HR HPV 2008   . Herniated nucleus pulposus, L5-S1 08/10/2014  . Spondylolysis, lumbosacral 08/13/2014    Past Surgical History  Procedure Laterality Date  . Tubal ligation    . Knee surgery      Rt-95,Lft.-98,Replacement-09  . Rotator cuff repair  2004  . Cervical biopsy  w/ loop electrode excision  2006  . Colposcopy    . Back surgery      Fusion  . Tonsillectomy    . Cataract extraction w/ intraocular lens  implant, bilateral    . Colonoscopy    . Lumbar laminectomy/decompression microdiscectomy Bilateral 08/10/2014    Procedure: Bilateral Lumbar five-Sacral one Diskectomy;  Surgeon: Kristeen Miss, MD;  Location: [facility];  Service: Neurosurgery;  Laterality: Bilateral;  Bilateral L5-S1 Diskectomy     Current Outpatient Prescriptions  Medication Sig Dispense Refill  . Ascorbic Acid (VITAMIN C) 100 MG tablet Take 100 mg by mouth daily.      . celecoxib (CELEBREX) 200 MG capsule Take 200 mg by mouth daily.    . cholecalciferol (VITAMIN D) 1000 UNITS tablet Take 5,000 Units by mouth daily.     . Cyanocobalamin  (VITAMIN B-12 PO) Take 1 tablet by mouth daily.    . fish oil-omega-3 fatty acids 1000 MG capsule Take 2 g by mouth daily.      Marland Kitchen HYDROcodone-acetaminophen (NORCO/VICODIN) 5-325 MG per tablet Take 1-2 tablets by mouth every 4 (four) hours as needed for moderate pain. 60 tablet 0  . Multiple Vitamin (MULTIVITAMIN) capsule Take 1 capsule by mouth daily.      Marland Kitchen zolpidem (AMBIEN) 10 MG tablet Take 1 tablet by mouth daily.  0   No current facility-administered medications for this visit.    Allergies:   Review of patient's allergies indicates no known allergies.    Social History:  The patient  reports that she has never smoked. She has never used smokeless tobacco. She reports that she drinks about 6.0 oz of alcohol per week. She reports that she does not use illicit drugs.   Family History:  The patient's family history includes Dementia in her mother; Diabetes in her father; Heart disease in her father; Hypertension in her father and mother.    ROS:  Please see the history of present illness.    Review of Systems: Constitutional:  denies fever, chills, diaphoresis, appetite change and fatigue.  HEENT: denies  photophobia, eye pain, redness, hearing loss, ear pain, congestion, sore throat, rhinorrhea, sneezing, neck pain, neck stiffness and tinnitus.  Respiratory: denies SOB, DOE, cough, chest tightness, and wheezing.  Cardiovascular: denies chest pain, palpitations and leg swelling.  Gastrointestinal: denies nausea, vomiting, abdominal pain, diarrhea, constipation, blood in stool.  Genitourinary: denies dysuria, urgency, frequency, hematuria, flank pain and difficulty urinating.  Musculoskeletal: denies  myalgias, back pain, joint swelling, arthralgias and gait problem.   Skin: denies pallor, rash and wound.  Neurological: denies dizziness, seizures, syncope, weakness, light-headedness, numbness and headaches.   Hematological: denies adenopathy, easy bruising, personal or family bleeding  history.  Psychiatric/ Behavioral: denies suicidal ideation, mood changes, confusion, nervousness, sleep disturbance and agitation.       All other systems are reviewed and negative.    PHYSICAL EXAM: VS:  BP 136/70 mmHg  Pulse 70  Ht 5\' 2"  (1.575 m)  Wt 109 lb (49.442 kg)  BMI 19.93 kg/m2 , BMI Body mass index is 19.93 kg/(m^2). GEN: Well nourished, well developed, in no acute distress HEENT: normal Neck: no JVD, carotid bruits, or masses Cardiac: RRR; no murmurs, rubs, or gallops,no edema  Respiratory:  clear to auscultation bilaterally, normal work of breathing GI: soft, nontender, nondistended, + BS MS: no deformity or atrophy Skin: warm and dry, no rash Neuro:  Strength and sensation are intact Psych: normal   EKG:  EKG is ordered today. ECG from several days ago reveals normal sinus rhythm with poor R-wave progression. The ekg ordered today demonstrates  NSR with normal R-wave progression.  Recent Labs: 08/10/2014: BUN 18; Creatinine, Ser 0.71; Potassium 3.7; Sodium 136 12/20/2014: Hemoglobin 14.2; Platelets 310    Lipid Panel No results found for: CHOL, TRIG, HDL, CHOLHDL, VLDL, LDLCALC, LDLDIRECT    Wt Readings from Last 3 Encounters:  12/24/14 109 lb (49.442 kg)  12/20/14 114 lb 6 oz (51.88 kg)  08/13/14 113 lb (51.256 kg)      Other studies Reviewed: Additional studies/ records that were reviewed today include: . Review of the above records demonstrates:   ASSESSMENT AND PLAN:  1.  Poor R wave progression:   I suspect that her abnormal ECG is just due to lead placement.  She has not had any chest pain or shortness of breath that would suggest that she's had a previous anterior wall myocardial infarction. 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  I think the EKG from several days ago shows poor R-wave progression because of lead placement abnormalities. Repeat EKG today is normal. She's not had any clinical events that  suggest that she's had a previous anterior wall myocardial infarction.   I think that she is at low risk for her upcoming GYN procedure.  At this point I don't think that she needs any additional testing. I'll be happy to see her on an as-needed basis.   Current medicines are reviewed at length with the patient today.  The patient does not have concerns regarding medicines.  The following changes have been made:  no change  Labs/ tests ordered today include:  No orders of the defined types were placed in this encounter.     Disposition:   FU with me as needed.      Nahser, Wonda Cheng, MD  12/24/2014 3:04 PM    [facility] [place], [place], [place]  [address] Phone: [phone]; Fax: [phone]   [facility]  [address]  [place], [place]  [address] [phone]   Fax [phone]

## 2014-12-27 ENCOUNTER — Encounter (HOSPITAL_COMMUNITY): Payer: Self-pay | Admitting: Anesthesiology

## 2014-12-27 MED ORDER — DEXTROSE 5 % IV SOLN
2.0000 g | INTRAVENOUS | Status: AC
  Administered 2014-12-28: 2 g via INTRAVENOUS
  Filled 2014-12-27: qty 2

## 2014-12-27 NOTE — Anesthesia Preprocedure Evaluation (Addendum)
Anesthesia Evaluation  Patient identified by MRN, date of birth, ID band Patient awake    Reviewed: Allergy & Precautions, NPO status , Patient's Chart, lab work & pertinent test results  History of Anesthesia Complications (+) DIFFICULT AIRWAY and history of anesthetic complications  Airway Mallampati: II  TM Distance: >3 FB Neck ROM: Full    Dental no notable dental hx. (+) Teeth Intact   Pulmonary neg pulmonary ROS,    Pulmonary exam normal breath sounds clear to auscultation       Cardiovascular negative cardio ROS Normal cardiovascular exam Rhythm:Regular Rate:Normal     Neuro/Psych negative neurological ROS  negative psych ROS   GI/Hepatic negative GI ROS, Neg liver ROS,   Endo/Other  negative endocrine ROS  Renal/GU negative Renal ROS  negative genitourinary   Musculoskeletal  (+) Arthritis , HNP L5-S1 Spondylolysis Lumbar   Abdominal   Peds  Hematology negative hematology ROS (+)   Anesthesia Other Findings   Reproductive/Obstetrics Atypical cells on Pap smear                            Anesthesia Physical Anesthesia Plan  ASA: II  Anesthesia Plan: General   Post-op Pain Management:    Induction: Intravenous  Airway Management Planned: LMA  Additional Equipment:   Intra-op Plan:   Post-operative Plan: Extubation in OR  Informed Consent: I have reviewed the patients History and Physical, chart, labs and discussed the procedure including the risks, benefits and alternatives for the proposed anesthesia with the patient or authorized representative who has indicated his/her understanding and acceptance.   Dental advisory given  Plan Discussed with: CRNA, Surgeon and Anesthesiologist  Anesthesia Plan Comments: (Hx/o Difficult intubation in the past)        Anesthesia Quick Evaluation

## 2014-12-28 ENCOUNTER — Ambulatory Visit (HOSPITAL_COMMUNITY)
Admission: RE | Admit: 2014-12-28 | Discharge: 2014-12-28 | Disposition: A | Discharge status: Home / Self Care | Payer: 59 | Source: Ambulatory Visit | Attending: Gynecology | Admitting: Gynecology

## 2014-12-28 ENCOUNTER — Ambulatory Visit (HOSPITAL_COMMUNITY): Payer: 59 | Admitting: Anesthesiology

## 2014-12-28 ENCOUNTER — Encounter (HOSPITAL_COMMUNITY): Payer: Self-pay | Admitting: Anesthesiology

## 2014-12-28 ENCOUNTER — Encounter (HOSPITAL_COMMUNITY)
Admission: RE | Disposition: A | Discharge status: Home / Self Care | Payer: Self-pay | Source: Ambulatory Visit | Attending: Gynecology

## 2014-12-28 DIAGNOSIS — R87619 Unspecified abnormal cytological findings in specimens from cervix uteri: Secondary | ICD-10-CM | POA: Diagnosis not present

## 2014-12-28 HISTORY — PX: HYSTEROSCOPY WITH D & C: SHX1775

## 2014-12-28 HISTORY — PX: CERVICAL CONIZATION W/BX: SHX1330

## 2014-12-28 SURGERY — DILATATION AND CURETTAGE /HYSTEROSCOPY
Anesthesia: General | Site: Vagina

## 2014-12-28 MED ORDER — LACTATED RINGERS IV SOLN
INTRAVENOUS | Status: DC
  Administered 2014-12-28 (×2): via INTRAVENOUS

## 2014-12-28 MED ORDER — SCOPOLAMINE 1 MG/3DAYS TD PT72
1.0000 | MEDICATED_PATCH | Freq: Once | TRANSDERMAL | Status: DC
  Administered 2014-12-28: 1.5 mg via TRANSDERMAL

## 2014-12-28 MED ORDER — LIDOCAINE HCL (CARDIAC) 20 MG/ML IV SOLN
INTRAVENOUS | Status: DC | PRN
  Administered 2014-12-28: 100 mg via INTRAVENOUS

## 2014-12-28 MED ORDER — FERRIC SUBSULFATE 259 MG/GM EX SOLN
CUTANEOUS | Status: AC
  Filled 2014-12-28: qty 8

## 2014-12-28 MED ORDER — IODINE STRONG (LUGOLS) 5 % PO SOLN
ORAL | Status: DC | PRN
  Administered 2014-12-28: 0.2 mL via ORAL

## 2014-12-28 MED ORDER — LIDOCAINE HCL (CARDIAC) 20 MG/ML IV SOLN
INTRAVENOUS | Status: AC
  Filled 2014-12-28: qty 5

## 2014-12-28 MED ORDER — CLINDAMYCIN PHOSPHATE 2 % VA CREA: 1.0000 | TOPICAL_CREAM | Freq: Every day | VAGINAL | Status: DC

## 2014-12-28 MED ORDER — LIDOCAINE HCL 1 % IJ SOLN
INTRAMUSCULAR | Status: AC
  Filled 2014-12-28: qty 20

## 2014-12-28 MED ORDER — FENTANYL CITRATE (PF) 100 MCG/2ML IJ SOLN
INTRAMUSCULAR | Status: AC
  Filled 2014-12-28: qty 2

## 2014-12-28 MED ORDER — HYDROCODONE-ACETAMINOPHEN 7.5-325 MG PO TABS: 1.0000 | ORAL_TABLET | Freq: Once | ORAL | Status: DC | PRN

## 2014-12-28 MED ORDER — VASOPRESSIN 20 UNIT/ML IV SOLN
INTRAVENOUS | Status: AC
  Filled 2014-12-28: qty 1

## 2014-12-28 MED ORDER — GLYCINE 1.5 % IR SOLN
Status: DC | PRN
  Administered 2014-12-28: 3000 mL

## 2014-12-28 MED ORDER — KETOROLAC TROMETHAMINE 30 MG/ML IJ SOLN
INTRAMUSCULAR | Status: AC
  Filled 2014-12-28: qty 6

## 2014-12-28 MED ORDER — PROPOFOL 10 MG/ML IV BOLUS
INTRAVENOUS | Status: AC
  Filled 2014-12-28: qty 20

## 2014-12-28 MED ORDER — ACETIC ACID 5 % SOLN
Status: AC
  Filled 2014-12-28: qty 500

## 2014-12-28 MED ORDER — KETOROLAC TROMETHAMINE 30 MG/ML IJ SOLN
INTRAMUSCULAR | Status: DC | PRN
  Administered 2014-12-28: 30 mg via INTRAVENOUS

## 2014-12-28 MED ORDER — MEPERIDINE HCL 25 MG/ML IJ SOLN: 6.2500 mg | INTRAMUSCULAR | Status: DC | PRN

## 2014-12-28 MED ORDER — IODINE STRONG (LUGOLS) 5 % PO SOLN
ORAL | Status: AC
  Filled 2014-12-28: qty 1

## 2014-12-28 MED ORDER — ONDANSETRON HCL 4 MG/2ML IJ SOLN
INTRAMUSCULAR | Status: DC | PRN
  Administered 2014-12-28: 4 mg via INTRAVENOUS

## 2014-12-28 MED ORDER — FERRIC SUBSULFATE SOLN
Status: DC | PRN
  Administered 2014-12-28: 1 via TOPICAL

## 2014-12-28 MED ORDER — DEXAMETHASONE SODIUM PHOSPHATE 4 MG/ML IJ SOLN
INTRAMUSCULAR | Status: DC | PRN
  Administered 2014-12-28: 10 mg via INTRAVENOUS

## 2014-12-28 MED ORDER — SODIUM CHLORIDE 0.9 % IJ SOLN
INTRAMUSCULAR | Status: AC
  Filled 2014-12-28: qty 100

## 2014-12-28 MED ORDER — SCOPOLAMINE 1 MG/3DAYS TD PT72
MEDICATED_PATCH | TRANSDERMAL | Status: AC
  Administered 2014-12-28: 1.5 mg via TRANSDERMAL
  Filled 2014-12-28: qty 1

## 2014-12-28 MED ORDER — PROPOFOL 10 MG/ML IV BOLUS
INTRAVENOUS | Status: DC | PRN
  Administered 2014-12-28: 150 mg via INTRAVENOUS

## 2014-12-28 MED ORDER — FENTANYL CITRATE (PF) 100 MCG/2ML IJ SOLN
25.0000 ug | INTRAMUSCULAR | Status: DC | PRN
  Administered 2014-12-28 (×2): 50 ug via INTRAVENOUS

## 2014-12-28 MED ORDER — ONDANSETRON HCL 4 MG/2ML IJ SOLN
INTRAMUSCULAR | Status: AC
  Filled 2014-12-28: qty 2

## 2014-12-28 MED ORDER — FENTANYL CITRATE (PF) 100 MCG/2ML IJ SOLN
INTRAMUSCULAR | Status: AC
  Administered 2014-12-28: 50 ug via INTRAVENOUS
  Filled 2014-12-28: qty 2

## 2014-12-28 MED ORDER — VASOPRESSIN 20 UNIT/ML IV SOLN
INTRAVENOUS | Status: DC | PRN
  Administered 2014-12-28: 10 [IU]

## 2014-12-28 MED ORDER — DEXAMETHASONE SODIUM PHOSPHATE 10 MG/ML IJ SOLN
INTRAMUSCULAR | Status: AC
  Filled 2014-12-28: qty 1

## 2014-12-28 MED ORDER — OXYCODONE-ACETAMINOPHEN 5-325 MG PO TABS: 1.0000 | ORAL_TABLET | ORAL | Status: DC | PRN

## 2014-12-28 MED ORDER — FENTANYL CITRATE (PF) 100 MCG/2ML IJ SOLN
INTRAMUSCULAR | Status: DC | PRN
  Administered 2014-12-28 (×2): 50 ug via INTRAVENOUS

## 2014-12-28 MED ORDER — SODIUM CHLORIDE 0.9 % IJ SOLN
INTRAMUSCULAR | Status: DC | PRN
  Administered 2014-12-28: 10 mL via INTRAVENOUS

## 2014-12-28 MED ORDER — METOCLOPRAMIDE HCL 5 MG/ML IJ SOLN: 10.0000 mg | Freq: Once | INTRAMUSCULAR | Status: DC | PRN

## 2014-12-28 SURGICAL SUPPLY — 45 items
APPLICATOR COTTON TIP 6IN STRL (MISCELLANEOUS) ×1 IMPLANT
BLADE SURG 11 STRL SS (BLADE) ×2 IMPLANT
CANISTER SUCT 3000ML (MISCELLANEOUS) ×2 IMPLANT
CATH ROBINSON RED A/P 16FR (CATHETERS) ×2 IMPLANT
CLOTH BEACON ORANGE TIMEOUT ST (SAFETY) ×2 IMPLANT
CONTAINER PREFILL 10% NBF 60ML (FORM) ×4 IMPLANT
CORD ACTIVE DISPOSABLE (ELECTRODE)
CORD ELECTRO ACTIVE DISP (ELECTRODE) IMPLANT
COUNTER NEEDLE 1200 MAGNETIC (NEEDLE) ×1 IMPLANT
ELECT BALL LEEP 5MM RED (ELECTRODE) ×1 IMPLANT
ELECT LOOP LEEP RND 10X10 YLW (CUTTING LOOP)
ELECT LOOP LEEP RND 15X12 GRN (CUTTING LOOP)
ELECT LOOP LEEP RND 20X12 WHT (CUTTING LOOP)
ELECT REM PT RETURN 9FT ADLT (ELECTROSURGICAL) ×2
ELECT VAPORTRODE GRVD BAR (ELECTRODE) IMPLANT
ELECTRODE LOOP LP RND 10X10YLW (CUTTING LOOP) IMPLANT
ELECTRODE LOOP LP RND 15X12GRN (CUTTING LOOP) IMPLANT
ELECTRODE LOOP LP RND 20X12WHT (CUTTING LOOP) IMPLANT
ELECTRODE REM PT RTRN 9FT ADLT (ELECTROSURGICAL) ×1 IMPLANT
EVACUATOR PREFILTER SMOKE (MISCELLANEOUS) ×2 IMPLANT
EXTENDER ELECT LOOP LEEP 10CM (CUTTING LOOP) ×1 IMPLANT
GLOVE BIOGEL PI IND STRL 7.0 (GLOVE) ×1 IMPLANT
GLOVE BIOGEL PI IND STRL 8 (GLOVE) ×1 IMPLANT
GLOVE BIOGEL PI INDICATOR 7.0 (GLOVE) ×1
GLOVE BIOGEL PI INDICATOR 8 (GLOVE) ×1
GLOVE ECLIPSE 7.5 STRL STRAW (GLOVE) ×4 IMPLANT
GOWN STRL REUS W/TWL LRG LVL3 (GOWN DISPOSABLE) ×4 IMPLANT
HOSE NS SMOKE EVAC 7/8 X6 (MISCELLANEOUS) ×2 IMPLANT
NS IRRIG 1000ML POUR BTL (IV SOLUTION) ×2 IMPLANT
PACK VAGINAL MINOR WOMEN LF (CUSTOM PROCEDURE TRAY) ×2 IMPLANT
PAD OB MATERNITY 4.3X12.25 (PERSONAL CARE ITEMS) ×2 IMPLANT
PAD PREP 24X48 CUFFED NSTRL (MISCELLANEOUS) ×2 IMPLANT
PENCIL BUTTON HOLSTER BLD 10FT (ELECTRODE) ×2 IMPLANT
SCOPETTES 8  STERILE (MISCELLANEOUS) ×2
SCOPETTES 8 STERILE (MISCELLANEOUS) ×2 IMPLANT
SPONGE SURGIFOAM ABS GEL 12-7 (HEMOSTASIS) IMPLANT
SUT VIC AB 0 CT1 27 (SUTURE) ×4
SUT VIC AB 0 CT1 27XBRD ANBCTR (SUTURE) ×2 IMPLANT
SYR TB 1ML 25GX5/8 (SYRINGE) ×1 IMPLANT
TOWEL OR 17X24 6PK STRL BLUE (TOWEL DISPOSABLE) ×4 IMPLANT
TUBING AQUILEX INFLOW (TUBING) ×2 IMPLANT
TUBING AQUILEX OUTFLOW (TUBING) ×2 IMPLANT
TUBING SMOKE EVAC HOSE ADAPTER (MISCELLANEOUS) ×1 IMPLANT
WATER STERILE IRR 1000ML POUR (IV SOLUTION) ×2 IMPLANT
YANKAUER SUCT BULB TIP NO VENT (SUCTIONS) ×1 IMPLANT

## 2014-12-28 NOTE — Anesthesia Procedure Notes (Signed)
Procedure Name: LMA Insertion Date/Time: 12/28/2014 7:35 AM Performed by: Riki Sheer Pre-anesthesia Checklist: Patient identified, Emergency Drugs available, Suction available, Patient being monitored and Timeout performed Patient Re-evaluated:Patient Re-evaluated prior to inductionOxygen Delivery Method: Circle system utilized Preoxygenation: Pre-oxygenation with 100% oxygen Intubation Type: IV induction Ventilation: Mask ventilation without difficulty LMA: LMA inserted LMA Size: 4.0 Number of attempts: 1 Placement Confirmation: positive ETCO2,  CO2 detector and breath sounds checked- equal and bilateral Tube secured with: Tape Dental Injury: Teeth and Oropharynx as per pre-operative assessment

## 2014-12-28 NOTE — Op Note (Signed)
   Operative Note  12/28/2014  8:36 AM  PATIENT:  Catherine [name]  69 y.o. female  PRE-OPERATIVE DIAGNOSIS:  atypical glandular cells on pap smear  POST-OPERATIVE DIAGNOSIS:  atypical glandular cells on pap smear  PROCEDURE:  Procedure(s): DILATATION AND CURETTAGE /HYSTEROSCOPY Diagnostic Hysteroscopy CONIZATION CERVIX WITH BIOPSY  SURGEON:  Surgeon(s): Terrance Mass, MD  ANESTHESIA:   general  FINDINGS: Atypical cells of undetermined significance on recent Pap smear. Hysteroscopic Truman Hayward there was no lesion seen atrophic endometrium noted. Both tubal os she were identified. Endocervical canal was smooth no lesions seen. Ectocervix no lesions seen. Vagina no lesions seen.  DESCRIPTION OF OPERATION: The patient was taken to the operating room where she underwent a successful general endotracheal anesthesia. A timeout was undertaken forcing out loud the procedure to be performed. Patient did receive 2 g of cefotetan for prophylaxis. Patient also had PAS stockings for DVT prophylaxis. The vagina and perineum were prepped and draped in usual sterile fashion. A red rubber Robinson catheter was inserted into the bladder to evacuate its contents for approximate 30 cc. The speculum was introduced into the vagina. CO2 tenaculum was placed on the anterior cervical lip. The uterus sounded to 7 cm. The cervix was dilated with a Pratt dilator to a size of 21. An operative 4 mm hysteroscope was introduced into the uterine cavity in a systematic inspection of the cavity and demonstrated only an atrophic endometrium with no lesions seen. Both tubal ostia were identified. On the way out the endocervical canal. Be smooth. An endometrial biopsy was then undertaken with a serrated curette and minimal tissue submitted for histological evaluation. In a separate aliquots a vigorous endocervical curetting was obtained once again minimal tissue obtained. We proceeded then with a cervical conization as follows. Suture  with 0 Vicryl was placed at the 2 to 4:00 position and 8 to the 10:00 position respectively for traction. Lugol's solution was placed on the ectocervix. Good uptake throughout. Prior to this Pitressin was infiltrated into the cervical stroma at the 2, 4, 8, 10:00 position for a total 15 cc. A cylindrical cone biopsy was obtained and submitted for histological evaluation. It was placed on a cork board. The base of the cone biopsy site was cauterized with a ball electrode followed by placement of Monsel solution for additional hemostasis. The sutures were secured. The patient was extubated transferred to recovery room stable vital signs. Blood loss was minimal. Patient received Toradol 30 mg IV in route to the recovery room.  ESTIMATED BLOOD LOSS: Minimal   Intake/Output Summary (Last 24 hours) at 12/28/14 0836 Last data filed at 12/28/14 0830  Gross per 24 hour  Intake   1500 ml  Output    100 ml  Net   1400 ml     BLOOD ADMINISTERED:none   LOCAL MEDICATIONS USED:  OTHER Pitressin 20 units and 100 cc of normal saline infiltrated into the cervical stroma at the 2, 4, 8, 10:00 positions for a total 15 cc  SPECIMEN:  Source of Specimen:  #1 endometrial biopsy #2 endocervical curettings #3 cervical cone  DISPOSITION OF SPECIMEN:  PATHOLOGY  COUNTS:  YES  PLAN OF CARE: Transfer to PACU  [facility] HMD8:36 AMTD@

## 2014-12-28 NOTE — Discharge Instructions (Signed)
DISCHARGE INSTRUCTIONS: D&C / D&E The following instructions have been prepared to help you care for yourself upon your return home.   Personal hygiene:  Use sanitary pads for vaginal drainage, not tampons.  Shower the day after your procedure.  NO tub baths, pools or Jacuzzis for 2-3 weeks.  Wipe front to back after using the bathroom.  Activity and limitations:  Do NOT drive or operate any equipment for 24 hours. The effects of anesthesia are still present and drowsiness may result.  Do NOT rest in bed all day.  Walking is encouraged.  Walk up and down stairs slowly.  You may resume your normal activity in one to two days or as indicated by your physician.  Sexual activity: NO intercourse for at least 2 weeks after the procedure, or as indicated by your physician.  Diet: Eat a light meal as desired this evening. You may resume your usual diet tomorrow.  Return to work: You may resume your work activities in one to two days or as indicated by your doctor.  What to expect after your surgery: Expect to have vaginal bleeding/discharge for 2-3 days and spotting for up to 10 days. It is not unusual to have soreness for up to 1-2 weeks. You may have a slight burning sensation when you urinate for the first day. Mild cramps may continue for a couple of days. You may have a regular period in 2-6 weeks.  Call your doctor for any of the following:  Excessive vaginal bleeding, saturating and changing one pad every hour.  Inability to urinate 6 hours after discharge from hospital.  Pain not relieved by pain medication.  Fever of 100.4 F or greater.  Unusual vaginal discharge or odor.   Call for an appointment:    Patients signature: ______________________  Nurses signature ________________________  Support person's signature______________  Can take IBUPROFEN Catherine [name] /ADVIL starting at 2:30PM Can use a heating pad. Increase water intake and oral intake next 48  hours

## 2014-12-28 NOTE — Interval H&P Note (Signed)
History and Physical Interval Note:  12/28/2014 7:14 AM  Catherine [name]  has presented today for surgery, with the diagnosis of atypical glandular cells on pap smear  The various methods of treatment have been discussed with the patient and family. After consideration of risks, benefits and other options for treatment, the patient has consented to  Procedure(s): DILATATION AND CURETTAGE /HYSTEROSCOPY Diagnostic Hysteroscopy (N/A) CONIZATION CERVIX WITH BIOPSY (N/A) as a surgical intervention .  The patient's history has been reviewed, patient examined, no change in status, stable for surgery.  I have reviewed the patient's chart and labs.  Questions were answered to the patient's satisfaction.     Terrance Mass

## 2014-12-28 NOTE — H&P (View-Only) (Signed)
Cardiology Office Note   Date:  12/24/2014   ID:  Catherine [name], DOB 02/03/1945, MRN [id number]  PCP:  Horton Finer, MD  Cardiologist:   Thayer Headings, MD   Chief Complaint  Patient presents with  . Abnormal ECG   Problem List  1. Abnormal ECG :    Poor R wave progression    History of Present Illness: Catherine [name]  is a 69 y.o. female who presents for  An abnormal ECG Catherine [name] works out regularly. Has been limited by 2 recent back surgeries.    Slow recovery  So far No CP ,  No dyspnea.   No syncope.  BP is typically very low .    Past Medical History  Diagnosis Date  . Fever blister   . Spasmodic dysphonia   . Difficult intubation     needs pediatric equipment  . Arthritis   . Insomnia   . Abnormal glandular Papanicolaou smear of cervix 11/04/2014  . CIN I (cervical intraepithelial neoplasia I)     LEEP 2006 margins free      negative HR HPV 2008   . Herniated nucleus pulposus, L5-S1 08/10/2014  . Spondylolysis, lumbosacral 08/13/2014    Past Surgical History  Procedure Laterality Date  . Tubal ligation    . Knee surgery      Rt-95,Lft.-98,Replacement-09  . Rotator cuff repair  2004  . Cervical biopsy  w/ loop electrode excision  2006  . Colposcopy    . Back surgery      Fusion  . Tonsillectomy    . Cataract extraction w/ intraocular lens  implant, bilateral    . Colonoscopy    . Lumbar laminectomy/decompression microdiscectomy Bilateral 08/10/2014    Procedure: Bilateral Lumbar five-Sacral one Diskectomy;  Surgeon: Kristeen Miss, MD;  Location: [facility];  Service: Neurosurgery;  Laterality: Bilateral;  Bilateral L5-S1 Diskectomy     Current Outpatient Prescriptions  Medication Sig Dispense Refill  . Ascorbic Acid (VITAMIN C) 100 MG tablet Take 100 mg by mouth daily.      . celecoxib (CELEBREX) 200 MG capsule Take 200 mg by mouth daily.    . cholecalciferol (VITAMIN D) 1000 UNITS tablet Take 5,000 Units by mouth daily.     . Cyanocobalamin  (VITAMIN B-12 PO) Take 1 tablet by mouth daily.    . fish oil-omega-3 fatty acids 1000 MG capsule Take 2 g by mouth daily.      Marland Kitchen HYDROcodone-acetaminophen (NORCO/VICODIN) 5-325 MG per tablet Take 1-2 tablets by mouth every 4 (four) hours as needed for moderate pain. 60 tablet 0  . Multiple Vitamin (MULTIVITAMIN) capsule Take 1 capsule by mouth daily.      Marland Kitchen zolpidem (AMBIEN) 10 MG tablet Take 1 tablet by mouth daily.  0   No current facility-administered medications for this visit.    Allergies:   Review of patient's allergies indicates no known allergies.    Social History:  The patient  reports that she has never smoked. She has never used smokeless tobacco. She reports that she drinks about 6.0 oz of alcohol per week. She reports that she does not use illicit drugs.   Family History:  The patient's family history includes Dementia in her mother; Diabetes in her father; Heart disease in her father; Hypertension in her father and mother.    ROS:  Please see the history of present illness.    Review of Systems: Constitutional:  denies fever, chills, diaphoresis, appetite change and fatigue.  HEENT: denies  photophobia, eye pain, redness, hearing loss, ear pain, congestion, sore throat, rhinorrhea, sneezing, neck pain, neck stiffness and tinnitus.  Respiratory: denies SOB, DOE, cough, chest tightness, and wheezing.  Cardiovascular: denies chest pain, palpitations and leg swelling.  Gastrointestinal: denies nausea, vomiting, abdominal pain, diarrhea, constipation, blood in stool.  Genitourinary: denies dysuria, urgency, frequency, hematuria, flank pain and difficulty urinating.  Musculoskeletal: denies  myalgias, back pain, joint swelling, arthralgias and gait problem.   Skin: denies pallor, rash and wound.  Neurological: denies dizziness, seizures, syncope, weakness, light-headedness, numbness and headaches.   Hematological: denies adenopathy, easy bruising, personal or family bleeding  history.  Psychiatric/ Behavioral: denies suicidal ideation, mood changes, confusion, nervousness, sleep disturbance and agitation.       All other systems are reviewed and negative.    PHYSICAL EXAM: VS:  BP 136/70 mmHg  Pulse 70  Ht 5\' 2"  (1.575 m)  Wt 109 lb (49.442 kg)  BMI 19.93 kg/m2 , BMI Body mass index is 19.93 kg/(m^2). GEN: Well nourished, well developed, in no acute distress HEENT: normal Neck: no JVD, carotid bruits, or masses Cardiac: RRR; no murmurs, rubs, or gallops,no edema  Respiratory:  clear to auscultation bilaterally, normal work of breathing GI: soft, nontender, nondistended, + BS MS: no deformity or atrophy Skin: warm and dry, no rash Neuro:  Strength and sensation are intact Psych: normal   EKG:  EKG is ordered today. ECG from several days ago reveals normal sinus rhythm with poor R-wave progression. The ekg ordered today demonstrates  NSR with normal R-wave progression.  Recent Labs: 08/10/2014: BUN 18; Creatinine, Ser 0.71; Potassium 3.7; Sodium 136 12/20/2014: Hemoglobin 14.2; Platelets 310    Lipid Panel No results found for: CHOL, TRIG, HDL, CHOLHDL, VLDL, LDLCALC, LDLDIRECT    Wt Readings from Last 3 Encounters:  12/24/14 109 lb (49.442 kg)  12/20/14 114 lb 6 oz (51.88 kg)  08/13/14 113 lb (51.256 kg)      Other studies Reviewed: Additional studies/ records that were reviewed today include: . Review of the above records demonstrates:   ASSESSMENT AND PLAN:  1.  Poor R wave progression:   I suspect that her abnormal ECG is just due to lead placement.  She has not had any chest pain or shortness of breath that would suggest that she's had a previous anterior wall myocardial infarction. 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  I think the EKG from several days ago shows poor R-wave progression because of lead placement abnormalities. Repeat EKG today is normal. She's not had any clinical events that  suggest that she's had a previous anterior wall myocardial infarction.   I think that she is at low risk for her upcoming GYN procedure.  At this point I don't think that she needs any additional testing. I'll be happy to see her on an as-needed basis.   Current medicines are reviewed at length with the patient today.  The patient does not have concerns regarding medicines.  The following changes have been made:  no change  Labs/ tests ordered today include:  No orders of the defined types were placed in this encounter.     Disposition:   FU with me as needed.      Nikos Anglemyer, Wonda Cheng, MD  12/24/2014 3:04 PM    [facility] [place], [place], [place]  [address] Phone: [phone]; Fax: [phone]   [facility]  [place]  [place], [place]  [address] [phone]   Fax [phone]

## 2014-12-28 NOTE — Anesthesia Postprocedure Evaluation (Signed)
Anesthesia Post Note  Patient: Catherine [name]  Procedure(s) Performed: Procedure(s) (LRB): DILATATION AND CURETTAGE /HYSTEROSCOPY Diagnostic Hysteroscopy (N/A) CONIZATION CERVIX WITH BIOPSY (N/A)  Patient location during evaluation: PACU Anesthesia Type: General Level of consciousness: awake Pain management: pain level controlled Vital Signs Assessment: post-procedure vital signs reviewed and stable Respiratory status: spontaneous breathing Cardiovascular status: stable Postop Assessment: no signs of nausea or vomiting Anesthetic complications: no    Last Vitals:  Filed Vitals:   12/28/14 0930 12/28/14 0945  BP: 121/65 120/75  Pulse: 63 77  Temp: 36.9 C   Resp: 16     Last Pain:  Filed Vitals:   12/28/14 0951  PainSc: [place]

## 2014-12-28 NOTE — Transfer of Care (Signed)
Immediate Anesthesia Transfer of Care Note  Patient: Catherine [name]  Procedure(s) Performed: Procedure(s): DILATATION AND CURETTAGE /HYSTEROSCOPY Diagnostic Hysteroscopy (N/A) CONIZATION CERVIX WITH BIOPSY (N/A)  Patient Location: PACU  Anesthesia Type:General  Level of Consciousness: awake, alert  and oriented  Airway & Oxygen Therapy: Patient Spontanous Breathing and Patient connected to nasal cannula oxygen  Post-op Assessment: Report given to RN and Post -op Vital signs reviewed and stable  Post vital signs: Reviewed and stable  Last Vitals:  Filed Vitals:   12/28/14 0608  BP: 106/73  Pulse: 82  Temp: 36.6 C  Resp: 20    Complications: No apparent anesthesia complications

## 2014-12-29 ENCOUNTER — Encounter (HOSPITAL_COMMUNITY): Payer: Self-pay | Admitting: Gynecology

## 2015-01-25 DIAGNOSIS — M545 Low back pain: Secondary | ICD-10-CM | POA: Diagnosis not present

## 2015-01-26 ENCOUNTER — Ambulatory Visit (INDEPENDENT_AMBULATORY_CARE_PROVIDER_SITE_OTHER): Payer: 59 | Admitting: Gynecology

## 2015-01-26 ENCOUNTER — Encounter: Payer: Self-pay | Admitting: Gynecology

## 2015-01-26 VITALS — BP 110/70 | Ht 62.0 in | Wt 113.0 lb

## 2015-01-26 DIAGNOSIS — Z79890 Hormone replacement therapy (postmenopausal): Secondary | ICD-10-CM

## 2015-01-26 DIAGNOSIS — N952 Postmenopausal atrophic vaginitis: Secondary | ICD-10-CM

## 2015-01-26 DIAGNOSIS — N951 Menopausal and female climacteric states: Secondary | ICD-10-CM

## 2015-01-26 DIAGNOSIS — Z09 Encounter for follow-up examination after completed treatment for conditions other than malignant neoplasm: Secondary | ICD-10-CM

## 2015-01-26 MED ORDER — ESTRADIOL 10 MCG VA TABS: 1.0000 | ORAL_TABLET | VAGINAL | Status: DC

## 2015-01-26 MED FILL — YUVAFEM 10 MCG VAGINAL INSE: 10 | 28 days supply | Qty: 8 | Fill #0

## 2015-01-26 NOTE — Progress Notes (Signed)
    Patient presented to the office today for her 3 week postop visit. Patient is status post diagnostic hysteroscopy, endometrial biopsy, ECC, and cervical cone biopsy as a result of atypical  Glandular cells on Pap smear. Patient doing well had had some bleeding a few days ago. Patient was complaining of hot flashes several times a day having difficulty sleeping and vaginal dryness. Review of her record indicated she had been on combination HRT Activella for proximally 20 years and we have started to take her off of it completely. Patient wanted to discuss alternative treatments.   Findings from surgery as well as pictures and pathology report were shared with the patient as follows:  FINDINGS: Atypical cells of undetermined significance on recent Pap smear. Hysteroscopically  there was no lesion seen atrophic endometrium noted. Both tubal os she were identified. Endocervical canal was smooth no lesions seen. Ectocervix no lesions seen. Vagina no lesions seen.   pathology report:  Diagnosis 1. Endocervix, curettage - BENIGN ENDOCERVIX AND BENIGN SQUAMOUS FRAGMENTS. - NO DYSPLASIA OR MALIGNANCY. 2. Endometrium, curettage, and biopsy - ATROPHIC ENDOMETRIAL GLANDS AND BENIGN SQUAMOUS FRAGMENTS. NO HYPERPLASIA OR MALIGNANCY. 3. Cervix, cone - FOCAL ATYPICAL SQUAMOUS METAPLASIA. - NO ENDOCERVICAL GLANDULAR DYSPLASIA OR SQUAMOUS DYSPLASIA. - SEE MICROSCOPIC DESCRIPTION. Microscopic Comment 3. The cervical cone shows focal atypical squamous metaplasia which can occasionally cause atypical glandular appearing cells on Pap smear. There is also focal tubal metaplasia within the endocervical glands which can also result atypical glandular cells on Pap smear. No endocervical or squamous dysplasia is identified in the cone   exam:  pelvic: Bartholin urethra Skene glands with atrophic changes Vagina: Atrophic changes Cervix: suture material to 3 and the 9:00 of the ectocervix was still present. Some  small amount of friable tissue was noted and the bleeding was contained with silver nitrate. Otherwise cone biopsy site healing.  Assessment/plan: 70 year old with history of atypical cells of undetermined significance on Pap smear status post negative hysteroscopy, negative endometrial biopsy ECC and cervical cone. See results of the report above for details. We'll follow-up Pap smear in 12 months. Patient to return back in 3 weeks for final postop visit. Patient will refrain from intercourse until that time. She will begin using Vagifem 10 g intravaginally twice a week for vaginal atrophy. She will also use peppermint oral behind the ear when necessary for vasomotor symptoms  She will also  order Relizen  To take 1 tablet twice a day for vasomotor symptoms. This product  Has been used for many years in [place] an was made at of pollen extract with no estrogen containing product. She will monitor symptoms for 3 months. If her vasomotor symptoms have not improved we had discussed putting her back on estrogen but a very low dose Daily with the addition of the Prometrium for 12 days of the month in a cyclical form instead of continuance if she accepts potential risk.

## 2015-01-26 NOTE — Patient Instructions (Signed)
Estradiol vaginal tablets What is this medicine? ESTRADIOL (es tra DYE ole) vaginal tablet is used to help relieve symptoms of vaginal irritation and dryness that occurs in some women during menopause. This medicine may be used for other purposes; ask your health care provider or pharmacist if you have questions. What should I tell my health care provider before I take this medicine? They need to know if you have any of these conditions: -abnormal vaginal bleeding -blood vessel disease or blood clots -breast, cervical, endometrial, ovarian, liver, or uterine cancer -dementia -diabetes -gallbladder disease -heart disease or recent heart attack -high blood pressure -high cholesterol -high level of calcium in the blood -hysterectomy -kidney disease -liver disease -migraine headaches -protein C deficiency -protein S deficiency -stroke -systemic lupus erythematosus (SLE) -tobacco smoker -an unusual or allergic reaction to estrogens, other hormones, medicines, foods, dyes, or preservatives -pregnant or trying to get pregnant -breast-feeding How should I use this medicine? This medicine is only for use in the vagina. Do not take by mouth. Wash and dry your hands before and after use. Read package directions carefully. Unwrap the applicator package. Be sure to use a new applicator for each dose. Use at the same time each day. If the tablet has fallen out of the applicator, but is still in the package, carefully place it back into the applicator. If the tablet has fallen out of the package, that applicator should be thrown out and you should use a new applicator containing a new tablet. Lie on your back, part and bend your knees. Gently insert the applicator as far as comfortably possible into the vagina. Then, gently press the plunger until the plunger is fully depressed. This will release the tablet into the vagina. Gently remove the applicator. Throw away the applicator after use. Do not use  your medicine more often than directed. Do not stop using except on the advice of your doctor or health care professional. Talk to your pediatrician regarding the use of this medicine in children. This medicine is not approved for use in children. A patient package insert for the product will be given with each prescription and refill. Read this sheet carefully each time. The sheet may change frequently. Overdosage: If you think you have taken too much of this medicine contact a poison control center or emergency room at once. NOTE: This medicine is only for you. Do not share this medicine with others. What if I miss a dose? If you miss a dose, take it as soon as you can. If it is almost time for your next dose, take only that dose. Do not take double or extra doses. What may interact with this medicine? Do not take this medicine with any of the following medications: -aromatase inhibitors like aminoglutethimide, anastrozole, exemestane, letrozole, testolactone This medicine may also interact with the following medications: -antibiotics used to treat tuberculosis like rifabutin, rifampin and rifapentene -raloxifene or tamoxifen -warfarin This list may not describe all possible interactions. Give your health care provider a list of all the medicines, herbs, non-prescription drugs, or dietary supplements you use. Also tell them if you smoke, drink alcohol, or use illegal drugs. Some items may interact with your medicine. What should I watch for while using this medicine? Visit your health care professional for regular checks on your progress. You will need a regular breast and pelvic exam. You should also discuss the need for regular mammograms with your health care professional, and follow his or her guidelines. This medicine can make   your body retain fluid, making your fingers, hands, or ankles swell. Your blood pressure can go up. Contact your doctor or health care professional if you feel you are  retaining fluid. If you have any reason to think you are pregnant; stop taking this medicine at once and contact your doctor or health care professional. Tobacco smoking increases the risk of getting a blood clot or having a stroke, especially if you are more than 70 years old. You are strongly advised not to smoke. If you wear contact lenses and notice visual changes, or if the lenses begin to feel uncomfortable, consult your eye care specialist. If you are going to have elective surgery, you may need to stop taking this medicine beforehand. Consult your health care professional for advice prior to scheduling the surgery. What side effects may I notice from receiving this medicine? Side effects that you should report to your doctor or health care professional as soon as possible: -allergic reactions like skin rash, itching or hives, swelling of the face, lips, or tongue -breast tissue changes or discharge -changes in vision -chest pain -confusion, trouble speaking or understanding -dark urine -general ill feeling or flu-like symptoms -light-colored stools -nausea, vomiting -pain, swelling, warmth in the leg -right upper belly pain -severe headaches -shortness of breath -sudden numbness or weakness of the face, arm or leg -trouble walking, dizziness, loss of balance or coordination -unusual vaginal bleeding -yellowing of the eyes or skin Side effects that usually do not require medical attention (report to your doctor or health care professional if they continue or are bothersome): -hair loss -increased hunger or thirst -increased urination -symptoms of vaginal infection like itching, irritation or unusual discharge -unusually weak or tired This list may not describe all possible side effects. Call your doctor for medical advice about side effects. You may report side effects to FDA at 1-800-FDA-1088. Where should I keep my medicine? Keep out of the reach of children. Store at room  temperature between 15 and 30 degrees C (59 and 86 degrees F). Throw away any unused medicine after the expiration date. NOTE: This sheet is a summary. It may not cover all possible information. If you have questions about this medicine, talk to your doctor, pharmacist, or health care provider.    2016, Elsevier/Gold Standard. (2013-12-23 09:22:51)  

## 2015-01-27 DIAGNOSIS — M545 Low back pain: Secondary | ICD-10-CM | POA: Diagnosis not present

## 2015-02-01 DIAGNOSIS — M545 Low back pain: Secondary | ICD-10-CM | POA: Diagnosis not present

## 2015-02-07 DIAGNOSIS — M1711 Unilateral primary osteoarthritis, right knee: Secondary | ICD-10-CM | POA: Diagnosis not present

## 2015-02-08 DIAGNOSIS — M545 Low back pain: Secondary | ICD-10-CM | POA: Diagnosis not present

## 2015-02-14 DIAGNOSIS — M545 Low back pain: Secondary | ICD-10-CM | POA: Diagnosis not present

## 2015-02-16 ENCOUNTER — Ambulatory Visit (INDEPENDENT_AMBULATORY_CARE_PROVIDER_SITE_OTHER): Payer: 59 | Admitting: Gynecology

## 2015-02-16 ENCOUNTER — Encounter: Payer: Self-pay | Admitting: Gynecology

## 2015-02-16 VITALS — BP 112/70

## 2015-02-16 DIAGNOSIS — Z09 Encounter for follow-up examination after completed treatment for conditions other than malignant neoplasm: Secondary | ICD-10-CM

## 2015-02-16 DIAGNOSIS — M1711 Unilateral primary osteoarthritis, right knee: Secondary | ICD-10-CM | POA: Diagnosis not present

## 2015-02-16 NOTE — Progress Notes (Signed)
   Patient  Presents to the office for her six-week postop visit.Patient is status post diagnostic hysteroscopy, endometrial biopsy, ECC, and cervical cone biopsy as a result of atypical Glandular cells on Pap smear. Patient doing well had had some bleeding a few days ago. Patient was complaining of hot flashes several times a day having difficulty sleeping and vaginal dryness. Review of her record indicated she had been on combination HRT Activella for proximally 20 years and we have started to take her off of it completely. Patient wanted to discuss alternative treatments.   Findings from surgery as well as pictures and pathology report were shared with the patient as follows:  FINDINGS: Atypical cells of undetermined significance on recent Pap smear. Hysteroscopically there was no lesion seen atrophic endometrium noted. Both tubal os she were identified. Endocervical canal was smooth no lesions seen. Ectocervix no lesions seen. Vagina no lesions seen.  pathology report:  Diagnosis 1. Endocervix, curettage - BENIGN ENDOCERVIX AND BENIGN SQUAMOUS FRAGMENTS. - NO DYSPLASIA OR MALIGNANCY. 2. Endometrium, curettage, and biopsy - ATROPHIC ENDOMETRIAL GLANDS AND BENIGN SQUAMOUS FRAGMENTS. NO HYPERPLASIA OR MALIGNANCY. 3. Cervix, cone - FOCAL ATYPICAL SQUAMOUS METAPLASIA. - NO ENDOCERVICAL GLANDULAR DYSPLASIA OR SQUAMOUS DYSPLASIA. - SEE MICROSCOPIC DESCRIPTION. Microscopic Comment 3. The cervical cone shows focal atypical squamous metaplasia which can occasionally cause atypical glandular appearing cells on Pap smear. There is also focal tubal metaplasia within the endocervical glands which can also result atypical glandular cells on Pap smear. No endocervical or squamous dysplasia is identified in the cone  exam:  pelvic: Bartholin urethra Skene glands within normal limits Vagina: No lesions or discharge Cervix: slightly raw  With minimal friability otherwise healed   bimanual exam: Not  done  rectal exam: Not done   assessment/plan:70 year old with history of atypical cells of undetermined significance on Pap smear status post negative hysteroscopy, negative endometrial biopsy ECC and cervical cone. See results of the report above for details. We'll follow-up Pap smear in 12 months. She will begin using Vagifem 10 g intravaginally twice a week for vaginal atrophy. She will also use peppermint oral behind the ear when necessary for vasomotor symptoms She will also order Relizen To take 1 tablet twice a day for vasomotor symptoms. This product Has been used for many years in [place] an was made at of pollen extract with no estrogen containing product. She will monitor symptoms for 3 months. If her vasomotor symptoms have not improved we had discussed putting her back on estrogen but a very low dose Daily with the addition of the Prometrium for 12 days of the month in a cyclical form instead of continuance if she accepts potential risk.

## 2015-02-17 DIAGNOSIS — Z85828 Personal history of other malignant neoplasm of skin: Secondary | ICD-10-CM | POA: Diagnosis not present

## 2015-02-17 DIAGNOSIS — D2272 Melanocytic nevi of left lower limb, including hip: Secondary | ICD-10-CM | POA: Diagnosis not present

## 2015-02-17 DIAGNOSIS — L814 Other melanin hyperpigmentation: Secondary | ICD-10-CM | POA: Diagnosis not present

## 2015-02-17 DIAGNOSIS — D1801 Hemangioma of skin and subcutaneous tissue: Secondary | ICD-10-CM | POA: Diagnosis not present

## 2015-02-17 DIAGNOSIS — D225 Melanocytic nevi of trunk: Secondary | ICD-10-CM | POA: Diagnosis not present

## 2015-02-17 DIAGNOSIS — L821 Other seborrheic keratosis: Secondary | ICD-10-CM | POA: Diagnosis not present

## 2015-02-22 DIAGNOSIS — M1711 Unilateral primary osteoarthritis, right knee: Secondary | ICD-10-CM | POA: Diagnosis not present

## 2015-02-23 DIAGNOSIS — Z1389 Encounter for screening for other disorder: Secondary | ICD-10-CM | POA: Diagnosis not present

## 2015-02-23 DIAGNOSIS — R202 Paresthesia of skin: Secondary | ICD-10-CM | POA: Diagnosis not present

## 2015-02-23 DIAGNOSIS — M4806 Spinal stenosis, lumbar region: Secondary | ICD-10-CM | POA: Diagnosis not present

## 2015-02-23 DIAGNOSIS — G4709 Other insomnia: Secondary | ICD-10-CM | POA: Diagnosis not present

## 2015-02-23 DIAGNOSIS — Z6821 Body mass index (BMI) 21.0-21.9, adult: Secondary | ICD-10-CM | POA: Diagnosis not present

## 2015-02-23 DIAGNOSIS — M159 Polyosteoarthritis, unspecified: Secondary | ICD-10-CM | POA: Diagnosis not present

## 2015-02-23 DIAGNOSIS — R490 Dysphonia: Secondary | ICD-10-CM | POA: Diagnosis not present

## 2015-02-23 DIAGNOSIS — M5416 Radiculopathy, lumbar region: Secondary | ICD-10-CM | POA: Insufficient documentation

## 2015-02-24 DIAGNOSIS — M4306 Spondylolysis, lumbar region: Secondary | ICD-10-CM | POA: Diagnosis not present

## 2015-02-25 ENCOUNTER — Other Ambulatory Visit: Payer: Self-pay | Admitting: Neurological Surgery

## 2015-02-25 DIAGNOSIS — M4306 Spondylolysis, lumbar region: Secondary | ICD-10-CM

## 2015-02-25 DIAGNOSIS — M545 Low back pain: Secondary | ICD-10-CM | POA: Diagnosis not present

## 2015-03-03 ENCOUNTER — Ambulatory Visit
Admission: RE | Admit: 2015-03-03 | Discharge: 2015-03-03 | Disposition: A | Discharge status: Home / Self Care | Payer: 59 | Source: Ambulatory Visit | Attending: Neurological Surgery | Admitting: Neurological Surgery

## 2015-03-03 DIAGNOSIS — M4306 Spondylolysis, lumbar region: Secondary | ICD-10-CM

## 2015-03-03 DIAGNOSIS — M5126 Other intervertebral disc displacement, lumbar region: Secondary | ICD-10-CM | POA: Diagnosis not present

## 2015-03-09 MED FILL — CELECOXIB 200 MG CAPSULE: 200 | 30 days supply | Qty: 60 | Fill #1

## 2015-03-10 MED FILL — diazePAM 5 MG TABS: 5 | 10 days supply | Qty: 40 | Fill #0

## 2015-03-20 DIAGNOSIS — M960 Pseudarthrosis after fusion or arthrodesis: Secondary | ICD-10-CM | POA: Diagnosis not present

## 2015-03-21 DIAGNOSIS — M545 Low back pain: Secondary | ICD-10-CM | POA: Diagnosis not present

## 2015-04-01 DIAGNOSIS — J385 Laryngeal spasm: Secondary | ICD-10-CM | POA: Diagnosis not present

## 2015-04-01 MED FILL — YUVAFEM 10 MCG VAGINAL INSE: 10 | 28 days supply | Qty: 8 | Fill #1

## 2015-04-07 DIAGNOSIS — R202 Paresthesia of skin: Secondary | ICD-10-CM | POA: Diagnosis not present

## 2015-04-07 DIAGNOSIS — Z0000 Encounter for general adult medical examination without abnormal findings: Secondary | ICD-10-CM | POA: Diagnosis not present

## 2015-04-15 DIAGNOSIS — G5600 Carpal tunnel syndrome, unspecified upper limb: Secondary | ICD-10-CM | POA: Insufficient documentation

## 2015-04-15 DIAGNOSIS — R7301 Impaired fasting glucose: Secondary | ICD-10-CM | POA: Insufficient documentation

## 2015-04-27 MED FILL — YUVAFEM 10 MCG VAGINAL INSE: 10 | 28 days supply | Qty: 8 | Fill #2

## 2015-05-02 MED FILL — HYDROCODON-APAP 5-325: 5-325 | 10 days supply | Qty: 60 | Fill #0

## 2015-05-16 MED FILL — CELECOXIB 200 MG CAPSULE: 200 | 90 days supply | Qty: 90 | Fill #0

## 2015-05-31 MED FILL — YUVAFEM 10 MCG VAGINAL INSE: 10 | 28 days supply | Qty: 8 | Fill #3

## 2015-06-14 ENCOUNTER — Other Ambulatory Visit: Payer: Self-pay

## 2015-06-14 DIAGNOSIS — Z1231 Encounter for screening mammogram for malignant neoplasm of breast: Secondary | ICD-10-CM

## 2015-06-16 MED FILL — diazePAM 5 MG TABS: 5 | 10 days supply | Qty: 40 | Fill #0

## 2015-06-23 ENCOUNTER — Ambulatory Visit
Admission: RE | Admit: 2015-06-23 | Discharge: 2015-06-23 | Disposition: A | Discharge status: Home / Self Care | Payer: 59 | Source: Ambulatory Visit

## 2015-06-23 DIAGNOSIS — Z1231 Encounter for screening mammogram for malignant neoplasm of breast: Secondary | ICD-10-CM

## 2015-06-29 DIAGNOSIS — S32009K Unspecified fracture of unspecified lumbar vertebra, subsequent encounter for fracture with nonunion: Secondary | ICD-10-CM | POA: Diagnosis not present

## 2015-06-29 MED FILL — HYDROCODON-APAP 5-325: 5-325 | 8 days supply | Qty: 60 | Fill #0

## 2015-06-29 MED FILL — YUVAFEM 10 MCG VAGINAL INSE: 10 | 28 days supply | Qty: 8 | Fill #4

## 2015-07-01 DIAGNOSIS — J385 Laryngeal spasm: Secondary | ICD-10-CM | POA: Diagnosis not present

## 2015-08-03 ENCOUNTER — Other Ambulatory Visit: Payer: Self-pay | Admitting: Neurological Surgery

## 2015-08-03 DIAGNOSIS — S32009K Unspecified fracture of unspecified lumbar vertebra, subsequent encounter for fracture with nonunion: Secondary | ICD-10-CM

## 2015-08-17 MED FILL — CELECOXIB 200 MG CAPSULE: 200 | 90 days supply | Qty: 90 | Fill #1

## 2015-08-17 MED FILL — YUVAFEM 10 MCG VAGINAL INSE: 10 | 28 days supply | Qty: 8 | Fill #5

## 2015-08-19 ENCOUNTER — Other Ambulatory Visit (HOSPITAL_COMMUNITY): Payer: Self-pay | Admitting: Neurological Surgery

## 2015-08-19 DIAGNOSIS — S32009K Unspecified fracture of unspecified lumbar vertebra, subsequent encounter for fracture with nonunion: Secondary | ICD-10-CM

## 2015-08-24 ENCOUNTER — Ambulatory Visit (HOSPITAL_COMMUNITY)
Admission: RE | Admit: 2015-08-24 | Discharge: 2015-08-24 | Disposition: A | Discharge status: Home / Self Care | Payer: 59 | Source: Ambulatory Visit | Attending: Neurological Surgery | Admitting: Neurological Surgery

## 2015-08-24 ENCOUNTER — Other Ambulatory Visit: Payer: 59

## 2015-08-24 DIAGNOSIS — S32059K Unspecified fracture of fifth lumbar vertebra, subsequent encounter for fracture with nonunion: Secondary | ICD-10-CM | POA: Diagnosis not present

## 2015-08-24 DIAGNOSIS — M4326 Fusion of spine, lumbar region: Secondary | ICD-10-CM | POA: Diagnosis not present

## 2015-08-24 DIAGNOSIS — S32009K Unspecified fracture of unspecified lumbar vertebra, subsequent encounter for fracture with nonunion: Secondary | ICD-10-CM | POA: Diagnosis present

## 2015-08-24 DIAGNOSIS — S32119K Unspecified Zone I fracture of sacrum, subsequent encounter for fracture with nonunion: Secondary | ICD-10-CM | POA: Insufficient documentation

## 2015-08-29 MED FILL — AMOXICILLIN 500 MG CAPSULE: 500 | 2 days supply | Qty: 8 | Fill #0

## 2015-08-31 DIAGNOSIS — S32009K Unspecified fracture of unspecified lumbar vertebra, subsequent encounter for fracture with nonunion: Secondary | ICD-10-CM | POA: Diagnosis not present

## 2015-08-31 MED FILL — HYDROCODON-APAP 5-325: 5-325 | 8 days supply | Qty: 60 | Fill #0

## 2015-08-31 MED FILL — diazePAM 5 MG TABS: 5 | 10 days supply | Qty: 40 | Fill #0

## 2015-09-30 DIAGNOSIS — J385 Laryngeal spasm: Secondary | ICD-10-CM | POA: Diagnosis not present

## 2015-10-07 DIAGNOSIS — M1711 Unilateral primary osteoarthritis, right knee: Secondary | ICD-10-CM | POA: Diagnosis not present

## 2015-10-14 DIAGNOSIS — M1711 Unilateral primary osteoarthritis, right knee: Secondary | ICD-10-CM | POA: Diagnosis not present

## 2015-10-28 DIAGNOSIS — M1711 Unilateral primary osteoarthritis, right knee: Secondary | ICD-10-CM | POA: Diagnosis not present

## 2015-11-01 MED FILL — valACYclovir HCL 1 GM TABS: 1 | 4 days supply | Qty: 12 | Fill #0

## 2015-11-09 MED FILL — HYDROCODON-APAP 5-325: 5-325 | 8 days supply | Qty: 60 | Fill #0

## 2015-11-21 MED FILL — CELECOXIB 200 MG CAPSULE: 200 | 90 days supply | Qty: 90 | Fill #0

## 2015-12-22 DIAGNOSIS — H524 Presbyopia: Secondary | ICD-10-CM | POA: Diagnosis not present

## 2015-12-22 DIAGNOSIS — H26493 Other secondary cataract, bilateral: Secondary | ICD-10-CM | POA: Diagnosis not present

## 2015-12-22 DIAGNOSIS — H04123 Dry eye syndrome of bilateral lacrimal glands: Secondary | ICD-10-CM | POA: Diagnosis not present

## 2015-12-28 DIAGNOSIS — S32009K Unspecified fracture of unspecified lumbar vertebra, subsequent encounter for fracture with nonunion: Secondary | ICD-10-CM | POA: Diagnosis not present

## 2015-12-29 MED FILL — diazePAM 5 MG TABS: 5 | 10 days supply | Qty: 40 | Fill #0

## 2015-12-29 MED FILL — HYDROCODON-APAP 5-325: 5-325 | 8 days supply | Qty: 60 | Fill #0

## 2016-01-06 DIAGNOSIS — J385 Laryngeal spasm: Secondary | ICD-10-CM | POA: Diagnosis not present

## 2016-02-02 ENCOUNTER — Encounter: Payer: Self-pay | Admitting: Women's Health

## 2016-02-02 ENCOUNTER — Telehealth: Payer: Self-pay | Admitting: *Deleted

## 2016-02-02 ENCOUNTER — Ambulatory Visit (INDEPENDENT_AMBULATORY_CARE_PROVIDER_SITE_OTHER): Payer: 59 | Admitting: Women's Health

## 2016-02-02 VITALS — BP 102/68 | Ht 61.75 in | Wt 113.0 lb

## 2016-02-02 DIAGNOSIS — Z1151 Encounter for screening for human papillomavirus (HPV): Secondary | ICD-10-CM | POA: Diagnosis not present

## 2016-02-02 DIAGNOSIS — M8580 Other specified disorders of bone density and structure, unspecified site: Secondary | ICD-10-CM

## 2016-02-02 DIAGNOSIS — E2839 Other primary ovarian failure: Secondary | ICD-10-CM | POA: Diagnosis not present

## 2016-02-02 DIAGNOSIS — Z01419 Encounter for gynecological examination (general) (routine) without abnormal findings: Secondary | ICD-10-CM | POA: Diagnosis not present

## 2016-02-02 LAB — URINALYSIS W MICROSCOPIC + REFLEX CULTURE
BACTERIA UA: NONE SEEN [HPF]
BILIRUBIN URINE: NEGATIVE
CASTS: NONE SEEN [LPF]
Crystals: NONE SEEN [HPF]
Glucose, UA: NEGATIVE
Hgb urine dipstick: NEGATIVE
KETONES UR: NEGATIVE
Leukocytes, UA: NEGATIVE
Nitrite: NEGATIVE
Protein, ur: NEGATIVE
RBC / HPF: NONE SEEN RBC/HPF (ref <=2)
Specific Gravity, Urine: 1.023 (ref 1.001–1.035)
Squamous Epithelial / LPF: NONE SEEN [HPF] (ref <=5)
WBC UA: NONE SEEN WBC/HPF (ref <=5)
Yeast: NONE SEEN [HPF]
pH: 7.5 (ref 5.0–8.0)

## 2016-02-02 MED ORDER — ESTRADIOL 10 MCG VA TABS: ORAL_TABLET | VAGINAL | 4 refills | Status: DC

## 2016-02-02 MED ORDER — ESTRADIOL 10 MCG VA TABS: 1.0000 | ORAL_TABLET | VAGINAL | 4 refills | Status: DC

## 2016-02-02 NOTE — Progress Notes (Addendum)
Catherine [name] 1945/03/21 [id number]    History:    Presents for annual exam.  Postmenopausal on no HRT with no bleeding. 06/2014 atypical glandular cells on Pap 12/2014  D&C with hysteroscope benign findings. 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 Normal mammogram history. 2011 negative colonoscopy. 2015 -.2 at femoral neck +1.2 at spine. Current on vaccines.  Past medical history, past surgical history, family history and social history were all reviewed and documented in the EPIC chart. Retired Pharmacist, hospital. Father 28 living in [place] in long-term care. 2 daughters both doing well , 2 stepsons both doing well. Husband vascular surgeon, reducing work hours.  ROS:  A ROS was performed and pertinent positives and negatives are included.  Exam:  Vitals:   02/02/16 1140  BP: 102/68  Weight: 113 lb (51.3 kg)  Height: 5' 1.75" (1.568 m)   Body mass index is 20.84 kg/m.   General appearance:  Normal Thyroid:  Symmetrical, normal in size, without palpable masses or nodularity. Respiratory  Auscultation:  Clear without wheezing or rhonchi Cardiovascular  Auscultation:  Regular rate, without rubs, murmurs or gallops  Edema/varicosities:  Not grossly evident Abdominal  Soft,nontender, without masses, guarding or rebound.  Liver/spleen:  No organomegaly noted  Hernia:  None appreciated  Skin  Inspection:  Grossly normal   Breasts: Examined lying and sitting.     Right: Without masses, retractions, discharge or axillary adenopathy.     Left: Without masses, retractions, discharge or axillary adenopathy. Gentitourinary   Inguinal/mons:  Normal without inguinal adenopathy  External genitalia:  Normal  BUS/Urethra/Skene's glands:  Normal  Vagina:  Atrophic, +1 cystocele  Cervix:  Normal stenotic  Uterus:   normal in size, shape and contour.  Midline and mobile  Adnexa/parametria:     Rt: Without masses or tenderness.   Lt: Without masses or tenderness.  Anus and perineum: Normal  Digital rectal exam: Normal  sphincter tone without palpated masses or tenderness  Assessment/Plan:  71 y.o. M WF G4 P2  for annual exam with no complaints other than joint pain.  Postmenopausal/no HRT/no bleeding Vaginal atrophy with +1 cystocele-asymptomatic Arthritis 2016 atypical glandular cells on Pap with negative D&C and hysteroscope/history of CIN-1 with LEEP 2006 Primary care manages labs  Plan: SBE's, continue annual 3-D screening mammogram, calcium rich diet, vitamin D 1000 daily encouraged. Instructed to have vitamin D level checked at primary care. Continue healthy lifestyle with regular exercise and healthy diet. Repeat DEXA, order in chart for breast Center . Home safety, fall prevention and importance of continuing weightbearing exercise reviewed. Vagifem 2-3 times weekly prescription, proper use given and reviewed. UA, Pap with HR HPV typing.    Huel Cote [facility], 12:51 PM 02/02/2016

## 2016-02-02 NOTE — Patient Instructions (Signed)

## 2016-02-02 NOTE — Telephone Encounter (Signed)
Pharmacy faxed sheet to verify the directions on the estradiol 10 mcg vaginal tablets. 2 different sets of directions was on Rx. Correct directions per note today office visit sent 2-3 times weekly.

## 2016-02-03 LAB — PAP, TP IMAGING W/ HPV RNA, RFLX HPV TYPE 16,18/45: HPV mRNA, High Risk: NOT DETECTED

## 2016-02-03 MED FILL — YUVAFEM 10 MCG VAGINAL INSE: 10 | 84 days supply | Qty: 36 | Fill #0

## 2016-02-06 ENCOUNTER — Encounter: Payer: Self-pay | Admitting: Women's Health

## 2016-02-15 DIAGNOSIS — Z85828 Personal history of other malignant neoplasm of skin: Secondary | ICD-10-CM | POA: Diagnosis not present

## 2016-02-15 DIAGNOSIS — L814 Other melanin hyperpigmentation: Secondary | ICD-10-CM | POA: Diagnosis not present

## 2016-02-15 DIAGNOSIS — D2271 Melanocytic nevi of right lower limb, including hip: Secondary | ICD-10-CM | POA: Diagnosis not present

## 2016-02-15 DIAGNOSIS — L821 Other seborrheic keratosis: Secondary | ICD-10-CM | POA: Diagnosis not present

## 2016-02-15 DIAGNOSIS — L570 Actinic keratosis: Secondary | ICD-10-CM | POA: Diagnosis not present

## 2016-02-15 DIAGNOSIS — I788 Other diseases of capillaries: Secondary | ICD-10-CM | POA: Diagnosis not present

## 2016-02-20 MED FILL — CELECOXIB 200 MG CAPSULE: 200 | 90 days supply | Qty: 90 | Fill #1

## 2016-03-08 MED FILL — valACYclovir HCL 1 GM TABS: 1 | 4 days supply | Qty: 12 | Fill #1

## 2016-03-12 MED FILL — HYDROCODON-APAP 5-325: 5-325 | 8 days supply | Qty: 60 | Fill #0

## 2016-03-12 MED FILL — diazePAM 5 MG TABS: 5 | 10 days supply | Qty: 40 | Fill #0

## 2016-03-19 ENCOUNTER — Other Ambulatory Visit (HOSPITAL_COMMUNITY): Payer: Self-pay | Admitting: Neurological Surgery

## 2016-03-19 ENCOUNTER — Other Ambulatory Visit: Payer: Self-pay | Admitting: Neurological Surgery

## 2016-03-19 DIAGNOSIS — S32009K Unspecified fracture of unspecified lumbar vertebra, subsequent encounter for fracture with nonunion: Secondary | ICD-10-CM

## 2016-03-20 ENCOUNTER — Ambulatory Visit (HOSPITAL_COMMUNITY)
Admission: RE | Admit: 2016-03-20 | Discharge: 2016-03-20 | Disposition: A | Discharge status: Home / Self Care | Payer: 59 | Source: Ambulatory Visit | Attending: Neurological Surgery | Admitting: Neurological Surgery

## 2016-03-20 DIAGNOSIS — M4316 Spondylolisthesis, lumbar region: Secondary | ICD-10-CM | POA: Insufficient documentation

## 2016-03-20 DIAGNOSIS — X58XXXD Exposure to other specified factors, subsequent encounter: Secondary | ICD-10-CM | POA: Diagnosis not present

## 2016-03-20 DIAGNOSIS — N200 Calculus of kidney: Secondary | ICD-10-CM | POA: Diagnosis not present

## 2016-03-20 DIAGNOSIS — Z981 Arthrodesis status: Secondary | ICD-10-CM | POA: Diagnosis not present

## 2016-03-20 DIAGNOSIS — M48061 Spinal stenosis, lumbar region without neurogenic claudication: Secondary | ICD-10-CM | POA: Insufficient documentation

## 2016-03-20 DIAGNOSIS — S32009K Unspecified fracture of unspecified lumbar vertebra, subsequent encounter for fracture with nonunion: Secondary | ICD-10-CM | POA: Diagnosis not present

## 2016-03-23 ENCOUNTER — Ambulatory Visit (HOSPITAL_COMMUNITY): Payer: 59

## 2016-03-29 DIAGNOSIS — S32009K Unspecified fracture of unspecified lumbar vertebra, subsequent encounter for fracture with nonunion: Secondary | ICD-10-CM | POA: Diagnosis not present

## 2016-04-06 DIAGNOSIS — J385 Laryngeal spasm: Secondary | ICD-10-CM | POA: Diagnosis not present

## 2016-04-10 DIAGNOSIS — Z0000 Encounter for general adult medical examination without abnormal findings: Secondary | ICD-10-CM | POA: Diagnosis not present

## 2016-04-10 DIAGNOSIS — R8299 Other abnormal findings in urine: Secondary | ICD-10-CM | POA: Diagnosis not present

## 2016-04-10 DIAGNOSIS — R7301 Impaired fasting glucose: Secondary | ICD-10-CM | POA: Diagnosis not present

## 2016-04-10 DIAGNOSIS — N390 Urinary tract infection, site not specified: Secondary | ICD-10-CM | POA: Diagnosis not present

## 2016-04-17 DIAGNOSIS — G5601 Carpal tunnel syndrome, right upper limb: Secondary | ICD-10-CM | POA: Diagnosis not present

## 2016-04-17 DIAGNOSIS — R7301 Impaired fasting glucose: Secondary | ICD-10-CM | POA: Diagnosis not present

## 2016-04-17 DIAGNOSIS — G4709 Other insomnia: Secondary | ICD-10-CM | POA: Diagnosis not present

## 2016-04-17 DIAGNOSIS — Z1389 Encounter for screening for other disorder: Secondary | ICD-10-CM | POA: Diagnosis not present

## 2016-04-17 DIAGNOSIS — M4807 Spinal stenosis, lumbosacral region: Secondary | ICD-10-CM | POA: Diagnosis not present

## 2016-04-17 DIAGNOSIS — Z0000 Encounter for general adult medical examination without abnormal findings: Secondary | ICD-10-CM | POA: Diagnosis not present

## 2016-04-17 DIAGNOSIS — M25572 Pain in left ankle and joints of left foot: Secondary | ICD-10-CM | POA: Diagnosis not present

## 2016-04-17 DIAGNOSIS — Z6821 Body mass index (BMI) 21.0-21.9, adult: Secondary | ICD-10-CM | POA: Diagnosis not present

## 2016-04-17 DIAGNOSIS — R490 Dysphonia: Secondary | ICD-10-CM | POA: Diagnosis not present

## 2016-04-17 DIAGNOSIS — M159 Polyosteoarthritis, unspecified: Secondary | ICD-10-CM | POA: Diagnosis not present

## 2016-04-23 ENCOUNTER — Other Ambulatory Visit: Payer: 59

## 2016-04-25 DIAGNOSIS — M7672 Peroneal tendinitis, left leg: Secondary | ICD-10-CM | POA: Diagnosis not present

## 2016-04-26 ENCOUNTER — Ambulatory Visit
Admission: RE | Admit: 2016-04-26 | Discharge: 2016-04-26 | Disposition: A | Discharge status: Home / Self Care | Payer: 59 | Source: Ambulatory Visit | Attending: Women's Health | Admitting: Women's Health

## 2016-04-26 ENCOUNTER — Other Ambulatory Visit: Payer: 59

## 2016-04-26 DIAGNOSIS — E2839 Other primary ovarian failure: Secondary | ICD-10-CM

## 2016-04-26 DIAGNOSIS — M8580 Other specified disorders of bone density and structure, unspecified site: Secondary | ICD-10-CM

## 2016-04-26 DIAGNOSIS — M8588 Other specified disorders of bone density and structure, other site: Secondary | ICD-10-CM | POA: Diagnosis not present

## 2016-04-26 DIAGNOSIS — Z780 Asymptomatic menopausal state: Secondary | ICD-10-CM | POA: Diagnosis not present

## 2016-04-28 ENCOUNTER — Encounter: Payer: Self-pay | Admitting: Women's Health

## 2016-05-08 MED FILL — diazePAM 5 MG TABS: 5 | 10 days supply | Qty: 40 | Fill #0

## 2016-05-08 MED FILL — HYDROCODON-APAP 5-325: 5-325 | 8 days supply | Qty: 60 | Fill #0

## 2016-05-08 MED FILL — valACYclovir HCL 1 GM TABS: 1 | 4 days supply | Qty: 12 | Fill #2

## 2016-05-08 MED FILL — YUVAFEM 10 MCG VAGINAL INSE: 10 | 84 days supply | Qty: 36 | Fill #1

## 2016-05-10 DIAGNOSIS — M7672 Peroneal tendinitis, left leg: Secondary | ICD-10-CM | POA: Diagnosis not present

## 2016-05-10 DIAGNOSIS — M1711 Unilateral primary osteoarthritis, right knee: Secondary | ICD-10-CM | POA: Diagnosis not present

## 2016-05-17 DIAGNOSIS — M1711 Unilateral primary osteoarthritis, right knee: Secondary | ICD-10-CM | POA: Diagnosis not present

## 2016-05-17 MED FILL — CELECOXIB 200 MG CAP: 200 | 90 days supply | Qty: 90 | Fill #2

## 2016-05-24 DIAGNOSIS — M1711 Unilateral primary osteoarthritis, right knee: Secondary | ICD-10-CM | POA: Diagnosis not present

## 2016-06-06 ENCOUNTER — Encounter: Payer: Self-pay | Admitting: Gynecology

## 2016-07-06 DIAGNOSIS — J385 Laryngeal spasm: Secondary | ICD-10-CM | POA: Diagnosis not present

## 2016-07-06 MED FILL — diazePAM 5 MG TABS: 5 | 10 days supply | Qty: 40 | Fill #0

## 2016-08-20 MED FILL — CELECOXIB 200 MG CAP: 200 | 90 days supply | Qty: 90 | Fill #0

## 2016-09-05 MED FILL — HYDROCODON-APAP 5-325: 5-325 | 8 days supply | Qty: 60 | Fill #0

## 2016-09-05 MED FILL — diazePAM 5 MG TABS: 5 | 10 days supply | Qty: 40 | Fill #0

## 2016-10-11 DIAGNOSIS — M4306 Spondylolysis, lumbar region: Secondary | ICD-10-CM | POA: Diagnosis not present

## 2016-10-11 DIAGNOSIS — S32009K Unspecified fracture of unspecified lumbar vertebra, subsequent encounter for fracture with nonunion: Secondary | ICD-10-CM | POA: Diagnosis not present

## 2016-10-11 MED FILL — predniSONE 10 MG TABS: 10 | 12 days supply | Qty: 48 | Fill #0

## 2016-10-12 ENCOUNTER — Other Ambulatory Visit (HOSPITAL_COMMUNITY): Payer: Self-pay | Admitting: Neurological Surgery

## 2016-10-12 DIAGNOSIS — S32009K Unspecified fracture of unspecified lumbar vertebra, subsequent encounter for fracture with nonunion: Secondary | ICD-10-CM

## 2016-10-12 DIAGNOSIS — J385 Laryngeal spasm: Secondary | ICD-10-CM | POA: Diagnosis not present

## 2016-10-17 ENCOUNTER — Other Ambulatory Visit: Payer: Self-pay | Admitting: Women's Health

## 2016-10-17 ENCOUNTER — Other Ambulatory Visit: Payer: Self-pay | Admitting: Neurological Surgery

## 2016-10-17 DIAGNOSIS — Z1231 Encounter for screening mammogram for malignant neoplasm of breast: Secondary | ICD-10-CM

## 2016-10-22 ENCOUNTER — Encounter (HOSPITAL_COMMUNITY): Payer: Self-pay

## 2016-10-22 ENCOUNTER — Ambulatory Visit (HOSPITAL_COMMUNITY)
Admission: RE | Admit: 2016-10-22 | Discharge: 2016-10-22 | Disposition: A | Discharge status: Home / Self Care | Payer: 59 | Source: Ambulatory Visit | Attending: Neurological Surgery | Admitting: Neurological Surgery

## 2016-10-22 DIAGNOSIS — S32009K Unspecified fracture of unspecified lumbar vertebra, subsequent encounter for fracture with nonunion: Secondary | ICD-10-CM

## 2016-10-22 DIAGNOSIS — M48061 Spinal stenosis, lumbar region without neurogenic claudication: Secondary | ICD-10-CM | POA: Insufficient documentation

## 2016-10-22 DIAGNOSIS — M960 Pseudarthrosis after fusion or arthrodesis: Secondary | ICD-10-CM | POA: Insufficient documentation

## 2016-10-22 DIAGNOSIS — M5126 Other intervertebral disc displacement, lumbar region: Secondary | ICD-10-CM | POA: Diagnosis not present

## 2016-10-22 DIAGNOSIS — Z981 Arthrodesis status: Secondary | ICD-10-CM | POA: Diagnosis not present

## 2016-10-24 DIAGNOSIS — M4306 Spondylolysis, lumbar region: Secondary | ICD-10-CM | POA: Diagnosis not present

## 2016-10-24 DIAGNOSIS — S32009K Unspecified fracture of unspecified lumbar vertebra, subsequent encounter for fracture with nonunion: Secondary | ICD-10-CM | POA: Diagnosis not present

## 2016-10-24 MED FILL — HYDROCODON-APAP 5-325: 5-325 | 8 days supply | Qty: 60 | Fill #0

## 2016-10-24 MED FILL — diazePAM 5 MG TABS: 5 | 10 days supply | Qty: 40 | Fill #0

## 2016-11-06 ENCOUNTER — Ambulatory Visit
Admission: RE | Admit: 2016-11-06 | Discharge: 2016-11-06 | Disposition: A | Discharge status: Home / Self Care | Payer: 59 | Source: Ambulatory Visit | Attending: Women's Health | Admitting: Women's Health

## 2016-11-06 DIAGNOSIS — Z1231 Encounter for screening mammogram for malignant neoplasm of breast: Secondary | ICD-10-CM | POA: Diagnosis not present

## 2016-11-07 ENCOUNTER — Encounter: Payer: Self-pay | Admitting: Women's Health

## 2016-11-13 MED FILL — CELECOXIB 200 MG CAPS: 200 | 90 days supply | Qty: 90 | Fill #1

## 2016-11-28 DIAGNOSIS — M4310 Spondylolisthesis, site unspecified: Secondary | ICD-10-CM | POA: Diagnosis not present

## 2016-11-28 DIAGNOSIS — M545 Low back pain: Secondary | ICD-10-CM | POA: Diagnosis not present

## 2016-11-28 DIAGNOSIS — M549 Dorsalgia, unspecified: Secondary | ICD-10-CM | POA: Diagnosis not present

## 2016-11-28 DIAGNOSIS — M47816 Spondylosis without myelopathy or radiculopathy, lumbar region: Secondary | ICD-10-CM | POA: Diagnosis not present

## 2016-11-30 DIAGNOSIS — S32009K Unspecified fracture of unspecified lumbar vertebra, subsequent encounter for fracture with nonunion: Secondary | ICD-10-CM | POA: Diagnosis not present

## 2016-12-04 ENCOUNTER — Other Ambulatory Visit: Payer: Self-pay | Admitting: Neurological Surgery

## 2016-12-04 DIAGNOSIS — S32009K Unspecified fracture of unspecified lumbar vertebra, subsequent encounter for fracture with nonunion: Secondary | ICD-10-CM

## 2016-12-06 ENCOUNTER — Telehealth: Payer: Self-pay | Admitting: Vascular Surgery

## 2016-12-06 NOTE — Telephone Encounter (Signed)
-----   Message from Willy Eddy, RN sent at 12/05/2016 12:10 PM EST ----- Regarding: appt. for [facility] patient  Please schedule appointment in office with Dr. Donnetta Hutching. ALIF sched. For 01/23/2017.

## 2016-12-06 NOTE — Telephone Encounter (Signed)
Sched appt 01/08/17 at 10:45. Lm on hm#.

## 2016-12-07 ENCOUNTER — Encounter: Payer: Self-pay | Admitting: Vascular Surgery

## 2016-12-12 MED FILL — diazePAM 5 MG TABS: 5 | 10 days supply | Qty: 40 | Fill #0

## 2016-12-19 ENCOUNTER — Other Ambulatory Visit: Payer: 59

## 2016-12-26 ENCOUNTER — Other Ambulatory Visit: Payer: Self-pay | Admitting: *Deleted

## 2017-01-02 ENCOUNTER — Ambulatory Visit (HOSPITAL_COMMUNITY)
Admission: RE | Admit: 2017-01-02 | Discharge: 2017-01-02 | Disposition: A | Discharge status: Home / Self Care | Payer: 59 | Source: Ambulatory Visit | Attending: Neurological Surgery | Admitting: Neurological Surgery

## 2017-01-02 ENCOUNTER — Encounter (HOSPITAL_COMMUNITY): Payer: Self-pay | Admitting: Radiology

## 2017-01-02 DIAGNOSIS — X58XXXD Exposure to other specified factors, subsequent encounter: Secondary | ICD-10-CM | POA: Insufficient documentation

## 2017-01-02 DIAGNOSIS — M48061 Spinal stenosis, lumbar region without neurogenic claudication: Secondary | ICD-10-CM | POA: Insufficient documentation

## 2017-01-02 DIAGNOSIS — M5136 Other intervertebral disc degeneration, lumbar region: Secondary | ICD-10-CM | POA: Diagnosis not present

## 2017-01-02 DIAGNOSIS — S32009K Unspecified fracture of unspecified lumbar vertebra, subsequent encounter for fracture with nonunion: Secondary | ICD-10-CM | POA: Diagnosis not present

## 2017-01-02 LAB — POCT I-STAT CREATININE: Creatinine, Ser: 0.6 mg/dL (ref 0.44–1.00)

## 2017-01-02 MED ORDER — GADOBENATE DIMEGLUMINE 529 MG/ML IV SOLN
10.0000 mL | Freq: Once | INTRAVENOUS | Status: AC | PRN
  Administered 2017-01-02: 10 mL via INTRAVENOUS

## 2017-01-02 MED FILL — HYDROCODON-APAP 5-325: 5-325 | 8 days supply | Qty: 60 | Fill #0

## 2017-01-03 DIAGNOSIS — S32009K Unspecified fracture of unspecified lumbar vertebra, subsequent encounter for fracture with nonunion: Secondary | ICD-10-CM | POA: Diagnosis not present

## 2017-01-04 DIAGNOSIS — J385 Laryngeal spasm: Secondary | ICD-10-CM | POA: Diagnosis not present

## 2017-01-08 ENCOUNTER — Ambulatory Visit (INDEPENDENT_AMBULATORY_CARE_PROVIDER_SITE_OTHER): Payer: 59 | Admitting: Vascular Surgery

## 2017-01-08 ENCOUNTER — Encounter: Payer: Self-pay | Admitting: Vascular Surgery

## 2017-01-08 VITALS — BP 101/66 | HR 75 | Resp 18 | Ht 61.0 in | Wt 111.7 lb

## 2017-01-08 DIAGNOSIS — M5137 Other intervertebral disc degeneration, lumbosacral region: Secondary | ICD-10-CM | POA: Diagnosis not present

## 2017-01-08 NOTE — Progress Notes (Signed)
Vascular and Vein Specialist of   Patient name: Catherine [name] MRN: [id number] DOB: 16-Jul-1945 Sex: female  REASON FOR CONSULT: Discuss anterior approach for L5-S1 disc disease  HPI: RUDINE RIEGER is a 71 y.o. female, who is here today for discussion of anterior exposure for repair of L5-S1 disc disease.  She has an extensive past history of lumbar disease with posterior fusion and.  She has had persistent back pain and leg pain and is now being prepared for redo surgery to include both anterior fusion of L5-S1 and posterior fusion as well.  She is seeing me today for discussion of anterior exposure.  She has had no prior intra-abdominal surgery.  No history of cardiac or peripheral vascular occlusive disease.  Excellent health otherwise and is very and is limited by her back and lower extremity symptoms.  Past Medical History:  Diagnosis Date  . Abnormal glandular Papanicolaou smear of cervix 11/04/2014  . Arthritis   . Back pain   . CIN I (cervical intraepithelial neoplasia I)    LEEP 2006 margins free      negative HR HPV 2008   . Difficult intubation    needs pediatric equipment  . Fever blister   . Herniated nucleus pulposus, L5-S1 08/10/2014  . Insomnia   . Neck pain   . Pseudoarthrosis of lumbar spine   . Spasmodic dysphonia   . 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 Hypertension Mother   . Dementia Mother   . Hypertension Father   . Diabetes Father   . Heart disease Father   . Heart failure Father   . Breast cancer Neg Hx     SOCIAL HISTORY: Social History   Socioeconomic History  . Marital status: Married    Spouse name: Not on file  . Number of children: Not on file  . Years of education: Not on file  . Highest education level: Not on file  Social Needs  . Financial resource strain: Not on file  . Food insecurity - worry: Not on file    . Food insecurity - inability: Not on file  . Transportation needs - medical: Not on file  . Transportation needs - non-medical: Not on file  Occupational History  . Not on file  Tobacco Use  . Smoking status: Never Smoker  . Smokeless tobacco: Never Used  Substance and Sexual Activity  . Alcohol use: Yes    Alcohol/week: 6.0 oz    Types: 10 Standard drinks or equivalent per week    Comment: daily drink  . Drug use: No  . Sexual activity: Yes    Birth control/protection: Post-menopausal, Surgical    Comment: INSURANCE QUESTIONS DECLINED  Other Topics Concern  . Not on file  Social History Narrative  . Not on file    No Known Allergies  Current Outpatient Medications  Medication Sig Dispense Refill  . celecoxib (CELEBREX) 200 MG capsule Take 200 mg by mouth daily.    . cholecalciferol (VITAMIN D) 1000 UNITS tablet Take 5,000 Units by mouth daily.     . Cyanocobalamin (VITAMIN B-12 PO) Take 1 tablet by mouth daily.    . diazepam (VALIUM) 5 MG tablet Take 5 mg by mouth every 6 (six) hours as needed for muscle spasms.     . fish oil-omega-3 fatty acids 1000 MG capsule Take 2 g by mouth daily.      Marland Kitchen HYDROcodone-acetaminophen (  NORCO/VICODIN) 5-325 MG tablet TAKE 1 TO 2 TABLETS BY MOUTH EVERY 6 HOURS AS NEEDED FOR PAIN    . Multiple Vitamin (MULTIVITAMIN) capsule Take 1 capsule by mouth daily.      Marland Kitchen zolpidem (AMBIEN) 10 MG tablet Take 1 tablet by mouth daily.  0  . Ascorbic Acid (VITAMIN C) 100 MG tablet Take 100 mg by mouth daily.      . clindamycin (CLEOCIN) 2 % vaginal cream Place 1 Applicatorful vaginally at bedtime. (Patient not taking: Reported on 02/02/2016) 40 g 0  . Estradiol 10 MCG TABS vaginal tablet insert one tablet 2-3 times a weekly (Patient not taking: Reported on 01/08/2017) 18 tablet 4  . oxyCODONE-acetaminophen (PERCOCET) 5-325 MG tablet Take 1 tablet by mouth every 4 (four) hours as needed for severe pain. (Patient not taking: Reported on 01/08/2017) 30 tablet 0    No current facility-administered medications for this visit.     REVIEW OF SYSTEMS:  [X]  denotes positive finding, [ ]  denotes negative finding Cardiac  Comments:  Chest pain or chest pressure:    Shortness of breath upon exertion:    Short of breath when lying flat:    Irregular heart rhythm:        Vascular    Pain in calf, thigh, or hip brought on by ambulation:    Pain in feet at night that wakes you up from your sleep:  x  nighttime leg cramps  Blood clot in your veins:    Leg swelling:         Pulmonary    Oxygen at home:    Productive cough:     Wheezing:         Neurologic    Sudden weakness in arms or legs:     Sudden numbness in arms or legs:     Sudden onset of difficulty speaking or slurred speech:    Temporary loss of vision in one eye:     Problems with dizziness:         Gastrointestinal    Blood in stool:     Vomited blood:         Genitourinary    Burning when urinating:     Blood in urine:        Psychiatric    Major depression:         Hematologic    Bleeding problems:    Problems with blood clotting too easily:        Skin    Rashes or ulcers:        Constitutional    Fever or chills:      PHYSICAL EXAM: Vitals:   01/08/17 0834  BP: 101/66  Pulse: 75  Resp: 18  SpO2: 98%  Weight: 111 lb 11.2 oz (50.7 kg)  Height: 5\' 1"  (1.549 m)    GENERAL: The patient is a well-nourished female, in no acute distress. The vital signs are documented above. CARDIOVASCULAR: Carotid arteries without bruits bilaterally.  2+ radial 2+ femoral and 2+ dorsalis pedis pulses bilaterally PULMONARY: There is good air exchange  ABDOMEN: Soft and non-tender  MUSCULOSKELETAL: There are no major deformities or cyanosis. NEUROLOGIC: No focal weakness or paresthesias are detected. SKIN: There are no ulcers or rashes noted. PSYCHIATRIC: The patient has a normal affect.  DATA:  I did review her lumbar spine CT scan from October 2018.  This shows no  calcification in her aortic and iliac vessels  MEDICAL ISSUES: Discussed my role for anterior  exposure for L5-S1 surgery.  Discussed mobilization of the rectus muscle.  Discussed mobilization of the intraperitoneal contents, left ureter and arterial and venous structures overlying spine.  Explained the potential for injury to all of these.  She does not have any contraindications to anterior exposure.  Specifically she has no evidence of calcification in her aortoiliac segments.  Also has had no prior abdominal surgery and she is small and will plan anterior exposure as scheduled on 01/23/2017   Rosetta Posner, MD [facility] V[facility] and Ve[facility] Specialists of [facility] Tel [phone] Pager [phone]

## 2017-01-14 NOTE — Pre-Procedure Instructions (Signed)
Catherine [name]  01/14/2017      [facility] [address] [place] - [address] [place] [address] Phone: [phone] Fax: [phone]  [place], [place] - [address] [facility]. [address] [place] [address] Phone: [phone] Fax: [phone]  [id number], [place] [address] Phone: [phone] Fax: [phone]    Your procedure is scheduled on Wednesday, January 23, 2017  Report to [facility] Admitting Entrance "A" at 6:30AM   Call this number if you have problems the morning of surgery:  [phone]   Remember:  Do not eat food or drink liquids after midnight.  Take these medicines the morning of surgery with A SIP OF WATER: If needed Diazepam (VALIUM) for spasms, HYDROcodone-acetaminophen (NORCO/VICODIN) for pain, and (SYSTANE OP) for dry eyes.  As of today, stop taking all Aspirins, Vitamins, Fish oils, and Herbal medications. Also stop all NSAIDS i.e. Advil, Ibuprofen, Motrin, Aleve, Anaprox, Naproxen, BC and Goody Powders. Including Celebrex.   Do not wear jewelry, make-up or nail polish.  Do not wear lotions, powders, perfumes, or deodorant.  Do not shave 48 hours prior to surgery.   Do not bring valuables to the hospital.  [facility] is not responsible for any belongings or valuables.  Contacts, dentures or bridgework may not be worn into surgery.  Leave your suitcase in the car.  After surgery it may be brought to your room.  For patients admitted to the hospital, discharge time will be determined by your treatment team.  Patients discharged the day of surgery will not be allowed to drive home.   Special instructions:  [facility]- Preparing For Surgery  Before surgery, you can play an important role. Because skin is not sterile, your skin needs to be as free of germs as possible.  You can reduce the number of germs on your skin by washing with CHG (chlorahexidine gluconate) Soap before surgery.  CHG is an antiseptic cleaner which kills germs and bonds with the skin to continue killing germs even after washing.  Please do not use if you have an allergy to CHG or antibacterial soaps. If your skin becomes reddened/irritated stop using the CHG.  Do not shave (including legs and underarms) for at least 48 hours prior to first CHG shower. It is OK to shave your face.  Please follow these instructions carefully.   1. Shower the NIGHT BEFORE SURGERY and the MORNING OF SURGERY with CHG.   2. If you chose to wash your hair, wash your hair first as usual with your normal shampoo.  3. After you shampoo, rinse your hair and body thoroughly to remove the shampoo.  4. Use CHG as you would any other liquid soap. You can apply CHG directly to the skin and wash gently with a scrungie or a clean washcloth.   5. Apply the CHG Soap to your body ONLY FROM THE NECK DOWN.  Do not use on open wounds or open sores. Avoid contact with your eyes, ears, mouth and genitals (private parts). Wash Face and genitals (private parts)  with your normal soap.  6. Wash thoroughly, paying special attention to the area where your surgery will be performed.  7. Thoroughly rinse your body with warm water from the neck down.  8. DO NOT shower/wash with your normal soap after  using and rinsing off the CHG Soap.  9. Pat yourself dry with a CLEAN TOWEL.  10. Wear CLEAN PAJAMAS to bed the night before surgery, wear comfortable clothes the morning of surgery  11. Place CLEAN SHEETS on your bed the night of your first shower and DO NOT SLEEP WITH PETS.  Day of Surgery: Do not apply any deodorants/lotions. Please wear clean clothes to the hospital/surgery center.    Please read over the following fact sheets that you were given. Pain Booklet, Coughing and Deep Breathing, Blood Transfusion Information, MRSA  Information and Surgical Site Infection Prevention

## 2017-01-16 ENCOUNTER — Encounter (HOSPITAL_COMMUNITY): Payer: Self-pay

## 2017-01-16 ENCOUNTER — Other Ambulatory Visit: Payer: Self-pay

## 2017-01-16 ENCOUNTER — Encounter (HOSPITAL_COMMUNITY)
Admission: RE | Admit: 2017-01-16 | Discharge: 2017-01-16 | Disposition: A | Discharge status: Home / Self Care | Payer: 59 | Source: Ambulatory Visit | Attending: Neurological Surgery | Admitting: Neurological Surgery

## 2017-01-16 DIAGNOSIS — Z8249 Family history of ischemic heart disease and other diseases of the circulatory system: Secondary | ICD-10-CM | POA: Insufficient documentation

## 2017-01-16 DIAGNOSIS — Z820 Family history of epilepsy and other diseases of the nervous system: Secondary | ICD-10-CM | POA: Diagnosis not present

## 2017-01-16 DIAGNOSIS — M5187 Other intervertebral disc disorders, lumbosacral region: Secondary | ICD-10-CM | POA: Diagnosis not present

## 2017-01-16 DIAGNOSIS — Z833 Family history of diabetes mellitus: Secondary | ICD-10-CM | POA: Insufficient documentation

## 2017-01-16 DIAGNOSIS — M1990 Unspecified osteoarthritis, unspecified site: Secondary | ICD-10-CM | POA: Diagnosis not present

## 2017-01-16 DIAGNOSIS — Z79899 Other long term (current) drug therapy: Secondary | ICD-10-CM | POA: Diagnosis not present

## 2017-01-16 DIAGNOSIS — Z79891 Long term (current) use of opiate analgesic: Secondary | ICD-10-CM | POA: Diagnosis not present

## 2017-01-16 DIAGNOSIS — Z01812 Encounter for preprocedural laboratory examination: Secondary | ICD-10-CM | POA: Insufficient documentation

## 2017-01-16 LAB — COMPREHENSIVE METABOLIC PANEL
ALT: 13 U/L — AB (ref 14–54)
AST: 21 U/L (ref 15–41)
Albumin: 4 g/dL (ref 3.5–5.0)
Alkaline Phosphatase: 76 U/L (ref 38–126)
Anion gap: 10 (ref 5–15)
BUN: 21 mg/dL — AB (ref 6–20)
CHLORIDE: 104 mmol/L (ref 101–111)
CO2: 23 mmol/L (ref 22–32)
CREATININE: 0.6 mg/dL (ref 0.44–1.00)
Calcium: 9.5 mg/dL (ref 8.9–10.3)
GFR calc Af Amer: >60 mL/min (ref >60)
GLUCOSE: 109 mg/dL — AB (ref 65–99)
Potassium: 4.2 mmol/L (ref 3.5–5.1)
SODIUM: 137 mmol/L (ref 135–145)
Total Bilirubin: 1.1 mg/dL (ref 0.3–1.2)
Total Protein: 6.2 g/dL — ABNORMAL LOW (ref 6.5–8.1)

## 2017-01-16 LAB — TYPE AND SCREEN
ABO/RH(D): O POS
ANTIBODY SCREEN: NEGATIVE

## 2017-01-16 LAB — CBC
HCT: 41.6 % (ref 36.0–46.0)
Hemoglobin: 14.2 g/dL (ref 12.0–15.0)
MCH: 31.2 pg (ref 26.0–34.0)
MCHC: 34.1 g/dL (ref 30.0–36.0)
MCV: 91.4 fL (ref 78.0–100.0)
Platelets: 287 10*3/uL (ref 150–400)
RBC: 4.55 MIL/uL (ref 3.87–5.11)
RDW: 12.1 % (ref 11.5–15.5)
WBC: 6.5 10*3/uL (ref 4.0–10.5)

## 2017-01-16 LAB — SURGICAL PCR SCREEN
MRSA, PCR: NEGATIVE
Staphylococcus aureus: NEGATIVE

## 2017-01-16 NOTE — Progress Notes (Signed)
PCP - Dr. Lendon Ka. Christena Flake - Dr. Elon Alas  Cardiologist - Denies  Chest x-ray - Denies  EKG - Denies  Stress Test - Denies  ECHO - Denies  Cardiac Cath - Denies  Sleep Study - No CPAP - None  LABS- 01/16/17: CBC, CMP, T/S   Anesthesia-No  Pt denies having chest pain, sob, or fever at this time. All instructions explained to the pt, with a verbal understanding of the material. Pt agrees to go over the instructions while at home for a better understanding. The opportunity to ask questions was provided.

## 2017-01-22 NOTE — Anesthesia Preprocedure Evaluation (Addendum)
Anesthesia Evaluation  Patient identified by MRN, date of birth, ID band Patient awake    Reviewed: Allergy & Precautions, NPO status , Patient's Chart, lab work & pertinent test results  History of Anesthesia Complications (+) DIFFICULT AIRWAY  Airway Mallampati: II  TM Distance: >3 FB Neck ROM: Full    Dental  (+) Dental Advisory Given   Pulmonary neg pulmonary ROS,    breath sounds clear to auscultation       Cardiovascular (-) angina(-) DOE negative cardio ROS   Rhythm:Regular Rate:Normal     Neuro/Psych negative neurological ROS     GI/Hepatic negative GI ROS, Neg liver ROS,   Endo/Other  negative endocrine ROS  Renal/GU negative Renal ROS     Musculoskeletal  (+) Arthritis ,   Abdominal   Peds  Hematology negative hematology ROS (+)   Anesthesia Other Findings   Reproductive/Obstetrics                            Lab Results  Component Value Date   WBC 6.5 01/16/2017   HGB 14.2 01/16/2017   HCT 41.6 01/16/2017   MCV 91.4 01/16/2017   PLT 287 01/16/2017   Lab Results  Component Value Date   CREATININE 0.60 01/16/2017   BUN 21 (H) 01/16/2017   NA 137 01/16/2017   K 4.2 01/16/2017   CL 104 01/16/2017   CO2 23 01/16/2017    Anesthesia Physical Anesthesia Plan  ASA: II  Anesthesia Plan: General   Post-op Pain Management:    Induction: Intravenous  PONV Risk Score and Plan: 3 and Ondansetron, Dexamethasone and Treatment may vary due to age or medical condition  Airway Management Planned: Oral ETT  Additional Equipment: Arterial line  Intra-op Plan:   Post-operative Plan: Extubation in OR and Possible Post-op intubation/ventilation  Informed Consent: I have reviewed the patients History and Physical, chart, labs and discussed the procedure including the risks, benefits and alternatives for the proposed anesthesia with the patient or authorized representative  who has indicated his/her understanding and acceptance.   Dental advisory given  Plan Discussed with: CRNA  Anesthesia Plan Comments:        Anesthesia Quick Evaluation

## 2017-01-23 ENCOUNTER — Inpatient Hospital Stay (HOSPITAL_COMMUNITY): Payer: 59 | Admitting: Anesthesiology

## 2017-01-23 ENCOUNTER — Encounter (HOSPITAL_COMMUNITY): Payer: Self-pay | Admitting: *Deleted

## 2017-01-23 ENCOUNTER — Inpatient Hospital Stay (HOSPITAL_COMMUNITY)
Admission: RE | Admit: 2017-01-23 | Discharge: 2017-01-27 | DRG: 454 | Disposition: A | Discharge status: Home / Self Care | Payer: 59 | Source: Ambulatory Visit | Attending: Neurological Surgery | Admitting: Neurological Surgery

## 2017-01-23 ENCOUNTER — Inpatient Hospital Stay (HOSPITAL_COMMUNITY): Payer: 59

## 2017-01-23 ENCOUNTER — Encounter (HOSPITAL_COMMUNITY)
Admission: RE | Disposition: A | Discharge status: Home / Self Care | Payer: Self-pay | Source: Ambulatory Visit | Attending: Neurological Surgery

## 2017-01-23 DIAGNOSIS — M4807 Spinal stenosis, lumbosacral region: Secondary | ICD-10-CM | POA: Diagnosis not present

## 2017-01-23 DIAGNOSIS — M5136 Other intervertebral disc degeneration, lumbar region: Secondary | ICD-10-CM | POA: Diagnosis not present

## 2017-01-23 DIAGNOSIS — M47816 Spondylosis without myelopathy or radiculopathy, lumbar region: Secondary | ICD-10-CM | POA: Diagnosis present

## 2017-01-23 DIAGNOSIS — Z833 Family history of diabetes mellitus: Secondary | ICD-10-CM

## 2017-01-23 DIAGNOSIS — M960 Pseudarthrosis after fusion or arthrodesis: Principal | ICD-10-CM | POA: Diagnosis present

## 2017-01-23 DIAGNOSIS — Z96652 Presence of left artificial knee joint: Secondary | ICD-10-CM | POA: Diagnosis present

## 2017-01-23 DIAGNOSIS — Z8249 Family history of ischemic heart disease and other diseases of the circulatory system: Secondary | ICD-10-CM | POA: Diagnosis not present

## 2017-01-23 DIAGNOSIS — M5127 Other intervertebral disc displacement, lumbosacral region: Secondary | ICD-10-CM | POA: Diagnosis not present

## 2017-01-23 DIAGNOSIS — Z419 Encounter for procedure for purposes other than remedying health state, unspecified: Secondary | ICD-10-CM

## 2017-01-23 DIAGNOSIS — M5126 Other intervertebral disc displacement, lumbar region: Secondary | ICD-10-CM | POA: Diagnosis not present

## 2017-01-23 DIAGNOSIS — Z9889 Other specified postprocedural states: Secondary | ICD-10-CM

## 2017-01-23 DIAGNOSIS — Z0389 Encounter for observation for other suspected diseases and conditions ruled out: Secondary | ICD-10-CM | POA: Diagnosis not present

## 2017-01-23 DIAGNOSIS — M4316 Spondylolisthesis, lumbar region: Secondary | ICD-10-CM | POA: Diagnosis not present

## 2017-01-23 DIAGNOSIS — M40299 Other kyphosis, site unspecified: Secondary | ICD-10-CM | POA: Diagnosis present

## 2017-01-23 DIAGNOSIS — M48061 Spinal stenosis, lumbar region without neurogenic claudication: Secondary | ICD-10-CM | POA: Diagnosis not present

## 2017-01-23 DIAGNOSIS — Y838 Other surgical procedures as the cause of abnormal reaction of the patient, or of later complication, without mention of misadventure at the time of the procedure: Secondary | ICD-10-CM | POA: Diagnosis present

## 2017-01-23 DIAGNOSIS — T84038A Mechanical loosening of other internal prosthetic joint, initial encounter: Secondary | ICD-10-CM | POA: Diagnosis present

## 2017-01-23 DIAGNOSIS — Z4789 Encounter for other orthopedic aftercare: Secondary | ICD-10-CM | POA: Diagnosis not present

## 2017-01-23 DIAGNOSIS — M4317 Spondylolisthesis, lumbosacral region: Secondary | ICD-10-CM | POA: Diagnosis not present

## 2017-01-23 DIAGNOSIS — S32009K Unspecified fracture of unspecified lumbar vertebra, subsequent encounter for fracture with nonunion: Secondary | ICD-10-CM | POA: Diagnosis present

## 2017-01-23 HISTORY — PX: ANTERIOR LUMBAR FUSION: SHX1170

## 2017-01-23 HISTORY — PX: ABDOMINAL EXPOSURE: SHX5708

## 2017-01-23 LAB — POCT I-STAT 7, (LYTES, BLD GAS, ICA,H+H)
ACID-BASE DEFICIT: 2 mmol/L (ref 0.0–2.0)
ACID-BASE DEFICIT: 1 mmol/L (ref 0.0–2.0)
BICARBONATE: 22.5 mmol/L (ref 20.0–28.0)
Bicarbonate: 24.8 mmol/L (ref 20.0–28.0)
Bicarbonate: 23.9 mmol/L (ref 20.0–28.0)
CALCIUM ION: 1.24 mmol/L (ref 1.15–1.40)
Calcium, Ion: 1.22 mmol/L (ref 1.15–1.40)
Calcium, Ion: 1.21 mmol/L (ref 1.15–1.40)
HCT: 31 % — ABNORMAL LOW (ref 36.0–46.0)
HEMATOCRIT: 28 % — AB (ref 36.0–46.0)
HEMATOCRIT: 26 % — AB (ref 36.0–46.0)
HEMOGLOBIN: 10.5 g/dL — AB (ref 12.0–15.0)
HEMOGLOBIN: 9.5 g/dL — AB (ref 12.0–15.0)
Hemoglobin: 8.8 g/dL — ABNORMAL LOW (ref 12.0–15.0)
O2 SAT: 100 %
O2 SAT: 100 %
O2 Saturation: 100 %
PCO2 ART: 35.2 mmHg (ref 32.0–48.0)
PCO2 ART: 39.3 mmHg (ref 32.0–48.0)
PH ART: 7.406 (ref 7.350–7.450)
PH ART: 7.41 (ref 7.350–7.450)
PH ART: 7.393 (ref 7.350–7.450)
PO2 ART: 186 mmHg — AB (ref 83.0–108.0)
PO2 ART: 204 mmHg — AB (ref 83.0–108.0)
POTASSIUM: 3.6 mmol/L (ref 3.5–5.1)
Patient temperature: 36.5
Patient temperature: 37.2
Potassium: 3.8 mmol/L (ref 3.5–5.1)
Potassium: 4 mmol/L (ref 3.5–5.1)
Sodium: 139 mmol/L (ref 135–145)
Sodium: 139 mmol/L (ref 135–145)
Sodium: 139 mmol/L (ref 135–145)
TCO2: 24 mmol/L (ref 22–32)
TCO2: 26 mmol/L (ref 22–32)
TCO2: 25 mmol/L (ref 22–32)
pCO2 arterial: 39 mmHg (ref 32.0–48.0)
pO2, Arterial: 214 mmHg — ABNORMAL HIGH (ref 83.0–108.0)

## 2017-01-23 SURGERY — ANTERIOR LUMBAR FUSION 1 LEVEL
Anesthesia: General | Site: Spine Lumbar

## 2017-01-23 MED ORDER — METHOCARBAMOL 500 MG PO TABS
500.0000 mg | ORAL_TABLET | Freq: Four times a day (QID) | ORAL | Status: DC | PRN
  Administered 2017-01-24 – 2017-01-27 (×5): 500 mg via ORAL
  Filled 2017-01-23 (×5): qty 1

## 2017-01-23 MED ORDER — NEOSTIGMINE METHYLSULFATE 5 MG/5ML IV SOSY
PREFILLED_SYRINGE | INTRAVENOUS | Status: DC | PRN
  Administered 2017-01-23: 2 mg via INTRAVENOUS

## 2017-01-23 MED ORDER — FENTANYL CITRATE (PF) 100 MCG/2ML IJ SOLN
INTRAMUSCULAR | Status: DC | PRN
  Administered 2017-01-23: 50 ug via INTRAVENOUS
  Administered 2017-01-23 (×2): 100 ug via INTRAVENOUS
  Administered 2017-01-23: 25 ug via INTRAVENOUS
  Administered 2017-01-23: 50 ug via INTRAVENOUS
  Administered 2017-01-23: 100 ug via INTRAVENOUS
  Administered 2017-01-23: 25 ug via INTRAVENOUS
  Administered 2017-01-23: 100 ug via INTRAVENOUS
  Administered 2017-01-23 (×2): 50 ug via INTRAVENOUS
  Administered 2017-01-23 (×3): 100 ug via INTRAVENOUS
  Administered 2017-01-23: 50 ug via INTRAVENOUS

## 2017-01-23 MED ORDER — ARTIFICIAL TEARS OPHTHALMIC OINT
TOPICAL_OINTMENT | OPHTHALMIC | Status: DC | PRN
  Administered 2017-01-23: 1 via OPHTHALMIC

## 2017-01-23 MED ORDER — FLEET ENEMA 7-19 GM/118ML RE ENEM
1.0000 | ENEMA | Freq: Once | RECTAL | Status: AC | PRN
  Administered 2017-01-25: 1 via RECTAL
  Filled 2017-01-23: qty 1

## 2017-01-23 MED ORDER — DIAZEPAM 5 MG PO TABS
5.0000 mg | ORAL_TABLET | Freq: Every day | ORAL | Status: DC | PRN
  Administered 2017-01-24: 5 mg via ORAL
  Filled 2017-01-23: qty 1

## 2017-01-23 MED ORDER — SODIUM CHLORIDE 0.9% FLUSH
3.0000 mL | Freq: Two times a day (BID) | INTRAVENOUS | Status: DC
  Administered 2017-01-23 – 2017-01-26 (×7): 3 mL via INTRAVENOUS

## 2017-01-23 MED ORDER — HYDROMORPHONE HCL 1 MG/ML IJ SOLN
INTRAMUSCULAR | Status: AC
  Administered 2017-01-23: 0.5 mg via INTRAVENOUS
  Filled 2017-01-23: qty 1

## 2017-01-23 MED ORDER — MIDAZOLAM HCL 2 MG/2ML IJ SOLN
INTRAMUSCULAR | Status: AC
  Filled 2017-01-23: qty 2

## 2017-01-23 MED ORDER — KETOROLAC TROMETHAMINE 15 MG/ML IJ SOLN
15.0000 mg | Freq: Four times a day (QID) | INTRAMUSCULAR | Status: AC
  Administered 2017-01-23 – 2017-01-24 (×4): 15 mg via INTRAVENOUS
  Filled 2017-01-23 (×4): qty 1

## 2017-01-23 MED ORDER — SODIUM CHLORIDE 0.9% FLUSH: 3.0000 mL | INTRAVENOUS | Status: DC | PRN

## 2017-01-23 MED ORDER — DEXAMETHASONE SODIUM PHOSPHATE 10 MG/ML IJ SOLN
INTRAMUSCULAR | Status: DC | PRN
  Administered 2017-01-23: 10 mg via INTRAVENOUS

## 2017-01-23 MED ORDER — CHLORHEXIDINE GLUCONATE CLOTH 2 % EX PADS: 6.0000 | MEDICATED_PAD | Freq: Once | CUTANEOUS | Status: DC

## 2017-01-23 MED ORDER — ONDANSETRON HCL 4 MG PO TABS: 4.0000 mg | ORAL_TABLET | Freq: Four times a day (QID) | ORAL | Status: DC | PRN

## 2017-01-23 MED ORDER — HYDROMORPHONE HCL 1 MG/ML IJ SOLN
INTRAMUSCULAR | Status: DC | PRN
  Administered 2017-01-23: 0.5 mg via INTRAVENOUS

## 2017-01-23 MED ORDER — CEFAZOLIN SODIUM-DEXTROSE 2-3 GM-%(50ML) IV SOLR
INTRAVENOUS | Status: DC | PRN
  Administered 2017-01-23 (×2): 2 g via INTRAVENOUS

## 2017-01-23 MED ORDER — LACTATED RINGERS IV SOLN
INTRAVENOUS | Status: DC | PRN
  Administered 2017-01-23 (×3): via INTRAVENOUS

## 2017-01-23 MED ORDER — ACETAMINOPHEN 650 MG RE SUPP: 650.0000 mg | RECTAL | Status: DC | PRN

## 2017-01-23 MED ORDER — BISACODYL 10 MG RE SUPP
10.0000 mg | Freq: Every day | RECTAL | Status: DC | PRN
  Administered 2017-01-25: 10 mg via RECTAL
  Filled 2017-01-23: qty 1

## 2017-01-23 MED ORDER — HYDROCODONE-ACETAMINOPHEN 5-325 MG PO TABS: 2.0000 | ORAL_TABLET | ORAL | Status: DC | PRN

## 2017-01-23 MED ORDER — PROMETHAZINE HCL 25 MG/ML IJ SOLN: 6.2500 mg | INTRAMUSCULAR | Status: DC | PRN

## 2017-01-23 MED ORDER — 0.9 % SODIUM CHLORIDE (POUR BTL) OPTIME
TOPICAL | Status: DC | PRN
  Administered 2017-01-23: 1000 mL

## 2017-01-23 MED ORDER — GLYCOPYRROLATE 0.2 MG/ML IV SOSY
PREFILLED_SYRINGE | INTRAVENOUS | Status: DC | PRN
  Administered 2017-01-23: .3 mg via INTRAVENOUS

## 2017-01-23 MED ORDER — PHENOL 1.4 % MT LIQD: 1.0000 | OROMUCOSAL | Status: DC | PRN

## 2017-01-23 MED ORDER — ONDANSETRON HCL 4 MG/2ML IJ SOLN
INTRAMUSCULAR | Status: DC | PRN
  Administered 2017-01-23: 4 mg via INTRAVENOUS

## 2017-01-23 MED ORDER — PHENYLEPHRINE HCL 10 MG/ML IJ SOLN
INTRAVENOUS | Status: DC | PRN
  Administered 2017-01-23: 10 ug/min via INTRAVENOUS

## 2017-01-23 MED ORDER — METHOCARBAMOL 1000 MG/10ML IJ SOLN
500.0000 mg | Freq: Four times a day (QID) | INTRAVENOUS | Status: DC | PRN
  Administered 2017-01-24: 500 mg via INTRAVENOUS
  Filled 2017-01-23 (×3): qty 5

## 2017-01-23 MED ORDER — POLYVINYL ALCOHOL 1.4 % OP SOLN
1.0000 [drp] | OPHTHALMIC | Status: DC | PRN
  Filled 2017-01-23: qty 15

## 2017-01-23 MED ORDER — ALIGN 4 MG PO CAPS: 4.0000 mg | ORAL_CAPSULE | Freq: Every day | ORAL | Status: DC

## 2017-01-23 MED ORDER — HYDROCODONE-ACETAMINOPHEN 5-325 MG PO TABS
1.0000 | ORAL_TABLET | ORAL | Status: DC | PRN
  Administered 2017-01-24 – 2017-01-27 (×11): 1 via ORAL
  Filled 2017-01-23 (×11): qty 1

## 2017-01-23 MED ORDER — MENTHOL 3 MG MT LOZG: 1.0000 | LOZENGE | OROMUCOSAL | Status: DC | PRN

## 2017-01-23 MED ORDER — FENTANYL CITRATE (PF) 250 MCG/5ML IJ SOLN
INTRAMUSCULAR | Status: AC
  Filled 2017-01-23: qty 10

## 2017-01-23 MED ORDER — SODIUM CHLORIDE 0.9 % IV SOLN: 250.0000 mL | INTRAVENOUS | Status: DC

## 2017-01-23 MED ORDER — MORPHINE SULFATE (PF) 4 MG/ML IV SOLN
2.0000 mg | INTRAVENOUS | Status: DC | PRN
  Administered 2017-01-23 – 2017-01-25 (×8): 2 mg via INTRAVENOUS
  Filled 2017-01-23 (×8): qty 1

## 2017-01-23 MED ORDER — ROCURONIUM BROMIDE 10 MG/ML (PF) SYRINGE
PREFILLED_SYRINGE | INTRAVENOUS | Status: AC
  Filled 2017-01-23: qty 5

## 2017-01-23 MED ORDER — DEXAMETHASONE SODIUM PHOSPHATE 10 MG/ML IJ SOLN
INTRAMUSCULAR | Status: AC
  Filled 2017-01-23: qty 1

## 2017-01-23 MED ORDER — ACETAMINOPHEN 325 MG PO TABS: 650.0000 mg | ORAL_TABLET | ORAL | Status: DC | PRN

## 2017-01-23 MED ORDER — PHENYLEPHRINE 40 MCG/ML (10ML) SYRINGE FOR IV PUSH (FOR BLOOD PRESSURE SUPPORT)
PREFILLED_SYRINGE | INTRAVENOUS | Status: AC
  Filled 2017-01-23: qty 10

## 2017-01-23 MED ORDER — PROPOFOL 10 MG/ML IV BOLUS
INTRAVENOUS | Status: AC
  Filled 2017-01-23: qty 20

## 2017-01-23 MED ORDER — ONDANSETRON HCL 4 MG/2ML IJ SOLN: 4.0000 mg | Freq: Four times a day (QID) | INTRAMUSCULAR | Status: DC | PRN

## 2017-01-23 MED ORDER — NEOSTIGMINE METHYLSULFATE 5 MG/5ML IV SOSY
PREFILLED_SYRINGE | INTRAVENOUS | Status: AC
  Filled 2017-01-23: qty 5

## 2017-01-23 MED ORDER — THROMBIN (RECOMBINANT) 5000 UNITS EX SOLR
CUTANEOUS | Status: AC
  Filled 2017-01-23: qty 5000

## 2017-01-23 MED ORDER — CEFAZOLIN SODIUM-DEXTROSE 2-4 GM/100ML-% IV SOLN: 2.0000 g | Freq: Three times a day (TID) | INTRAVENOUS | Status: DC

## 2017-01-23 MED ORDER — FLEET ENEMA 7-19 GM/118ML RE ENEM: 1.0000 | ENEMA | Freq: Once | RECTAL | Status: DC | PRN

## 2017-01-23 MED ORDER — SENNA 8.6 MG PO TABS: 1.0000 | ORAL_TABLET | Freq: Two times a day (BID) | ORAL | Status: DC

## 2017-01-23 MED ORDER — LIDOCAINE 2% (20 MG/ML) 5 ML SYRINGE
INTRAMUSCULAR | Status: AC
  Filled 2017-01-23: qty 5

## 2017-01-23 MED ORDER — THROMBIN (RECOMBINANT) 20000 UNITS EX SOLR
CUTANEOUS | Status: DC | PRN
  Administered 2017-01-23: 20000 [IU] via TOPICAL

## 2017-01-23 MED ORDER — SODIUM CHLORIDE 0.9 % IV SOLN
INTRAVENOUS | Status: DC | PRN
  Administered 2017-01-23: 13:00:00 via INTRAVENOUS

## 2017-01-23 MED ORDER — SODIUM CHLORIDE 0.9 % IR SOLN
Status: DC | PRN
  Administered 2017-01-23: 500 mL

## 2017-01-23 MED ORDER — BACITRACIN ZINC 500 UNIT/GM EX OINT
TOPICAL_OINTMENT | CUTANEOUS | Status: AC
  Filled 2017-01-23: qty 28.35

## 2017-01-23 MED ORDER — HYDROCODONE-ACETAMINOPHEN 5-325 MG PO TABS
1.0000 | ORAL_TABLET | Freq: Every day | ORAL | Status: DC
  Administered 2017-01-25 – 2017-01-27 (×3): 1 via ORAL
  Filled 2017-01-23 (×3): qty 1

## 2017-01-23 MED ORDER — LIDOCAINE-EPINEPHRINE 1 %-1:100000 IJ SOLN
INTRAMUSCULAR | Status: DC | PRN
  Administered 2017-01-23: 5 mL

## 2017-01-23 MED ORDER — ALBUMIN HUMAN 5 % IV SOLN
INTRAVENOUS | Status: DC | PRN
  Administered 2017-01-23 (×2): via INTRAVENOUS

## 2017-01-23 MED ORDER — ZOLPIDEM TARTRATE 5 MG PO TABS
10.0000 mg | ORAL_TABLET | Freq: Every day | ORAL | Status: DC
  Administered 2017-01-23 – 2017-01-26 (×4): 10 mg via ORAL
  Filled 2017-01-23 (×4): qty 2

## 2017-01-23 MED ORDER — POLYETHYL GLYCOL-PROPYL GLYCOL 0.4-0.3 % OP GEL: Freq: Every day | OPHTHALMIC | Status: DC | PRN

## 2017-01-23 MED ORDER — CEFAZOLIN SODIUM-DEXTROSE 2-4 GM/100ML-% IV SOLN
2.0000 g | Freq: Three times a day (TID) | INTRAVENOUS | Status: AC
  Administered 2017-01-23 – 2017-01-24 (×2): 2 g via INTRAVENOUS
  Filled 2017-01-23 (×2): qty 100

## 2017-01-23 MED ORDER — SENNA 8.6 MG PO TABS
1.0000 | ORAL_TABLET | Freq: Two times a day (BID) | ORAL | Status: DC
  Administered 2017-01-23 – 2017-01-27 (×5): 8.6 mg via ORAL
  Filled 2017-01-23 (×6): qty 1

## 2017-01-23 MED ORDER — RISAQUAD PO CAPS
1.0000 | ORAL_CAPSULE | Freq: Every day | ORAL | Status: DC
  Administered 2017-01-24 – 2017-01-27 (×4): 1 via ORAL
  Filled 2017-01-23 (×4): qty 1

## 2017-01-23 MED ORDER — DOCUSATE SODIUM 100 MG PO CAPS: 100.0000 mg | ORAL_CAPSULE | Freq: Two times a day (BID) | ORAL | Status: DC

## 2017-01-23 MED ORDER — ROCURONIUM BROMIDE 10 MG/ML (PF) SYRINGE
PREFILLED_SYRINGE | INTRAVENOUS | Status: DC | PRN
  Administered 2017-01-23 (×4): 30 mg via INTRAVENOUS
  Administered 2017-01-23 (×2): 40 mg via INTRAVENOUS
  Administered 2017-01-23 (×2): 30 mg via INTRAVENOUS
  Administered 2017-01-23: 40 mg via INTRAVENOUS

## 2017-01-23 MED ORDER — BISACODYL 10 MG RE SUPP: 10.0000 mg | Freq: Every day | RECTAL | Status: DC | PRN

## 2017-01-23 MED ORDER — PROPOFOL 10 MG/ML IV BOLUS
INTRAVENOUS | Status: DC | PRN
  Administered 2017-01-23: 100 mg via INTRAVENOUS

## 2017-01-23 MED ORDER — SODIUM CHLORIDE 0.9% FLUSH: 3.0000 mL | Freq: Two times a day (BID) | INTRAVENOUS | Status: DC

## 2017-01-23 MED ORDER — MIDAZOLAM HCL 5 MG/5ML IJ SOLN
INTRAMUSCULAR | Status: DC | PRN
  Administered 2017-01-23: 2 mg via INTRAVENOUS

## 2017-01-23 MED ORDER — CEFAZOLIN SODIUM-DEXTROSE 2-4 GM/100ML-% IV SOLN
INTRAVENOUS | Status: AC
  Filled 2017-01-23: qty 100

## 2017-01-23 MED ORDER — LIDOCAINE 2% (20 MG/ML) 5 ML SYRINGE
INTRAMUSCULAR | Status: DC | PRN
  Administered 2017-01-23: 20 mg via INTRAVENOUS
  Administered 2017-01-23: 60 mg via INTRAVENOUS

## 2017-01-23 MED ORDER — ROCURONIUM BROMIDE 10 MG/ML (PF) SYRINGE
PREFILLED_SYRINGE | INTRAVENOUS | Status: AC
  Filled 2017-01-23: qty 10

## 2017-01-23 MED ORDER — ALUM & MAG HYDROXIDE-SIMETH 200-200-20 MG/5ML PO SUSP: 30.0000 mL | Freq: Four times a day (QID) | ORAL | Status: DC | PRN

## 2017-01-23 MED ORDER — THROMBIN (RECOMBINANT) 5000 UNITS EX SOLR
CUTANEOUS | Status: DC | PRN
  Administered 2017-01-23 (×2): 5000 [IU] via TOPICAL

## 2017-01-23 MED ORDER — HEMOSTATIC AGENTS (NO CHARGE) OPTIME
TOPICAL | Status: DC | PRN
  Administered 2017-01-23: 1 via TOPICAL

## 2017-01-23 MED ORDER — ONDANSETRON HCL 4 MG/2ML IJ SOLN
4.0000 mg | Freq: Four times a day (QID) | INTRAMUSCULAR | Status: DC | PRN
  Administered 2017-01-24 – 2017-01-26 (×5): 4 mg via INTRAVENOUS
  Filled 2017-01-23 (×5): qty 2

## 2017-01-23 MED ORDER — LACTATED RINGERS IV SOLN
INTRAVENOUS | Status: DC
  Administered 2017-01-23: 21:00:00 via INTRAVENOUS

## 2017-01-23 MED ORDER — ACETAMINOPHEN 325 MG PO TABS
650.0000 mg | ORAL_TABLET | ORAL | Status: DC | PRN
  Filled 2017-01-23: qty 2

## 2017-01-23 MED ORDER — DOCUSATE SODIUM 100 MG PO CAPS
100.0000 mg | ORAL_CAPSULE | Freq: Two times a day (BID) | ORAL | Status: DC
  Administered 2017-01-23 – 2017-01-27 (×6): 100 mg via ORAL
  Filled 2017-01-23 (×6): qty 1

## 2017-01-23 MED ORDER — HYDROMORPHONE HCL 1 MG/ML IJ SOLN
0.5000 mg | INTRAMUSCULAR | Status: DC | PRN
  Administered 2017-01-23 (×2): 0.5 mg via INTRAVENOUS

## 2017-01-23 MED ORDER — ARTIFICIAL TEARS OPHTHALMIC OINT
TOPICAL_OINTMENT | OPHTHALMIC | Status: AC
  Filled 2017-01-23: qty 3.5

## 2017-01-23 MED ORDER — FENTANYL CITRATE (PF) 250 MCG/5ML IJ SOLN
INTRAMUSCULAR | Status: AC
  Filled 2017-01-23: qty 5

## 2017-01-23 MED ORDER — KETOROLAC TROMETHAMINE 15 MG/ML IJ SOLN
INTRAMUSCULAR | Status: AC
  Filled 2017-01-23: qty 1

## 2017-01-23 MED ORDER — POLYETHYLENE GLYCOL 3350 17 G PO PACK: 17.0000 g | PACK | Freq: Every day | ORAL | Status: DC | PRN

## 2017-01-23 MED ORDER — HEMOSTATIC AGENTS (NO CHARGE) OPTIME
TOPICAL | Status: DC | PRN
  Administered 2017-01-23 (×2): 1 via TOPICAL

## 2017-01-23 MED ORDER — HYDROMORPHONE HCL 1 MG/ML IJ SOLN
0.2500 mg | INTRAMUSCULAR | Status: DC | PRN
  Administered 2017-01-23 (×4): 0.5 mg via INTRAVENOUS

## 2017-01-23 MED ORDER — MENTHOL 3 MG MT LOZG
1.0000 | LOZENGE | OROMUCOSAL | Status: DC | PRN
Start: 2017-01-23 — End: 2017-01-23

## 2017-01-23 MED ORDER — POLYETHYLENE GLYCOL 3350 17 G PO PACK
17.0000 g | PACK | Freq: Every day | ORAL | Status: DC | PRN
  Administered 2017-01-24: 17 g via ORAL
  Filled 2017-01-23: qty 1

## 2017-01-23 MED ORDER — KETOROLAC TROMETHAMINE 15 MG/ML IJ SOLN
15.0000 mg | Freq: Four times a day (QID) | INTRAMUSCULAR | Status: DC | PRN
  Administered 2017-01-23 – 2017-01-27 (×8): 15 mg via INTRAVENOUS
  Filled 2017-01-23 (×7): qty 1

## 2017-01-23 MED ORDER — PHENYLEPHRINE HCL 10 MG/ML IJ SOLN
INTRAMUSCULAR | Status: DC | PRN
  Administered 2017-01-23 (×4): 40 ug via INTRAVENOUS

## 2017-01-23 MED ORDER — CEFAZOLIN SODIUM 1 G IJ SOLR
INTRAMUSCULAR | Status: AC
  Filled 2017-01-23: qty 20

## 2017-01-23 MED ORDER — SODIUM CHLORIDE 0.9% FLUSH
3.0000 mL | INTRAVENOUS | Status: DC | PRN
  Administered 2017-01-27: 3 mL via INTRAVENOUS
  Filled 2017-01-23: qty 3

## 2017-01-23 MED ORDER — LIDOCAINE-EPINEPHRINE 1 %-1:100000 IJ SOLN
INTRAMUSCULAR | Status: AC
  Filled 2017-01-23: qty 1

## 2017-01-23 MED ORDER — BUPIVACAINE HCL (PF) 0.5 % IJ SOLN
INTRAMUSCULAR | Status: AC
  Filled 2017-01-23: qty 30

## 2017-01-23 MED ORDER — HYDROMORPHONE HCL 1 MG/ML IJ SOLN
INTRAMUSCULAR | Status: AC
Start: 2017-01-23 — End: 2017-01-23
  Filled 2017-01-23: qty 0.5

## 2017-01-23 MED ORDER — LACTATED RINGERS IV SOLN
INTRAVENOUS | Status: DC | PRN
  Administered 2017-01-23: 08:00:00 via INTRAVENOUS

## 2017-01-23 MED ORDER — HYDROMORPHONE HCL 1 MG/ML IJ SOLN
INTRAMUSCULAR | Status: AC
  Filled 2017-01-23: qty 1

## 2017-01-23 MED ORDER — BUPIVACAINE HCL (PF) 0.5 % IJ SOLN
INTRAMUSCULAR | Status: DC | PRN
  Administered 2017-01-23: 5 mL
  Administered 2017-01-23: 20 mL

## 2017-01-23 MED ORDER — ONDANSETRON HCL 4 MG/2ML IJ SOLN
INTRAMUSCULAR | Status: AC
  Filled 2017-01-23: qty 2

## 2017-01-23 MED ORDER — THROMBIN (RECOMBINANT) 20000 UNITS EX SOLR
CUTANEOUS | Status: AC
  Filled 2017-01-23: qty 20000

## 2017-01-23 SURGICAL SUPPLY — 129 items
ADH SKN CLS APL DERMABOND .7 (GAUZE/BANDAGES/DRESSINGS) ×6
APL SRG 60D 8 XTD TIP BNDBL (TIP)
APPLIER CLIP 11 MED OPEN (CLIP) ×4
APR CLP MED 11 20 MLT OPN (CLIP) ×3
BAG DECANTER FOR FLEXI CONT (MISCELLANEOUS) ×7 IMPLANT
BASE TI HL 6X34X24 20D (Spacer) ×1 IMPLANT
BASKET BONE COLLECTION (BASKET) ×4 IMPLANT
BLADE CLIPPER SURG (BLADE) ×1 IMPLANT
BOLT BASE TI 5X17.5 VARIABLE (Bolt) ×3 IMPLANT
BONE CANC CHIPS 20CC PCAN1/4 (Bone Implant) ×4 IMPLANT
BUR BARREL STRAIGHT FLUTE 4.0 (BURR) ×4 IMPLANT
BUR MATCHSTICK NEURO 3.0 LAGG (BURR) ×4 IMPLANT
CAGE COROENT MP 8X23 (Cage) ×2 IMPLANT
CANISTER SUCT 3000ML PPV (MISCELLANEOUS) ×7 IMPLANT
CARTRIDGE OIL MAESTRO DRILL (MISCELLANEOUS) ×3 IMPLANT
CHIPS CANC BONE 20CC PCAN1/4 (Bone Implant) ×3 IMPLANT
CLIP APPLIE 11 MED OPEN (CLIP) ×3 IMPLANT
CLIP LIGATING EXTRA MED SLVR (CLIP) ×3 IMPLANT
CLIP LIGATING EXTRA SM BLUE (MISCELLANEOUS) ×3 IMPLANT
CONNECTOR RELINE 30MM OPEN OFF (Connector) ×2 IMPLANT
CONT SPEC 4OZ CLIKSEAL STRL BL (MISCELLANEOUS) ×4 IMPLANT
COVER BACK TABLE 24X17X13 BIG (DRAPES) IMPLANT
COVER BACK TABLE 60X90IN (DRAPES) ×7 IMPLANT
DECANTER SPIKE VIAL GLASS SM (MISCELLANEOUS) ×6 IMPLANT
DERMABOND ADVANCED (GAUZE/BANDAGES/DRESSINGS) ×2
DERMABOND ADVANCED .7 DNX12 (GAUZE/BANDAGES/DRESSINGS) ×9 IMPLANT
DEVICE DISSECT PLASMABLAD 3.0S (MISCELLANEOUS) ×3 IMPLANT
DIFFUSER DRILL AIR PNEUMATIC (MISCELLANEOUS) ×3 IMPLANT
DIGITIZER BENDINI (MISCELLANEOUS) ×1 IMPLANT
DRAPE C-ARM 42X72 X-RAY (DRAPES) ×20 IMPLANT
DRAPE HALF SHEET 40X57 (DRAPES) ×1 IMPLANT
DRAPE LAPAROTOMY 100X72X124 (DRAPES) ×8 IMPLANT
DRAPE POUCH INSTRU U-SHP 10X18 (DRAPES) ×8 IMPLANT
DURAPREP 26ML APPLICATOR (WOUND CARE) ×8 IMPLANT
DURASEAL APPLICATOR TIP (TIP) IMPLANT
DURASEAL SPINE SEALANT 3ML (MISCELLANEOUS) IMPLANT
ELECT BLADE 4.0 EZ CLEAN MEGAD (MISCELLANEOUS) ×4
ELECT REM PT RETURN 9FT ADLT (ELECTROSURGICAL) ×8
ELECTRODE BLDE 4.0 EZ CLN MEGD (MISCELLANEOUS) ×6 IMPLANT
ELECTRODE REM PT RTRN 9FT ADLT (ELECTROSURGICAL) ×6 IMPLANT
GAUZE SPONGE 4X4 12PLY STRL (GAUZE/BANDAGES/DRESSINGS) ×4 IMPLANT
GAUZE SPONGE 4X4 16PLY XRAY LF (GAUZE/BANDAGES/DRESSINGS) ×1 IMPLANT
GLOVE BIO SURGEON STRL SZ8 (GLOVE) ×1 IMPLANT
GLOVE BIO SURGEON STRL SZ8.5 (GLOVE) ×1 IMPLANT
GLOVE BIOGEL PI IND STRL 6.5 (GLOVE) IMPLANT
GLOVE BIOGEL PI IND STRL 7.5 (GLOVE) IMPLANT
GLOVE BIOGEL PI IND STRL 8 (GLOVE) IMPLANT
GLOVE BIOGEL PI IND STRL 8.5 (GLOVE) ×6 IMPLANT
GLOVE BIOGEL PI INDICATOR 6.5 (GLOVE) ×1
GLOVE BIOGEL PI INDICATOR 7.5 (GLOVE) ×2
GLOVE BIOGEL PI INDICATOR 8 (GLOVE) ×6
GLOVE BIOGEL PI INDICATOR 8.5 (GLOVE) ×3
GLOVE ECLIPSE 7.5 STRL STRAW (GLOVE) ×4 IMPLANT
GLOVE ECLIPSE 8.5 STRL (GLOVE) ×12 IMPLANT
GLOVE EXAM NITRILE LRG STRL (GLOVE) IMPLANT
GLOVE EXAM NITRILE XL STR (GLOVE) IMPLANT
GLOVE EXAM NITRILE XS STR PU (GLOVE) IMPLANT
GLOVE SS BIOGEL STRL SZ 7 (GLOVE) IMPLANT
GLOVE SS BIOGEL STRL SZ 7.5 (GLOVE) ×3 IMPLANT
GLOVE SUPERSENSE BIOGEL SZ 7 (GLOVE) ×1
GLOVE SUPERSENSE BIOGEL SZ 7.5 (GLOVE) ×1
GLOVE SURG SS PI 6.5 STRL IVOR (GLOVE) ×1 IMPLANT
GOWN STRL REUS W/ TWL LRG LVL3 (GOWN DISPOSABLE) ×3 IMPLANT
GOWN STRL REUS W/ TWL XL LVL3 (GOWN DISPOSABLE) ×3 IMPLANT
GOWN STRL REUS W/TWL 2XL LVL3 (GOWN DISPOSABLE) ×14 IMPLANT
GOWN STRL REUS W/TWL LRG LVL3 (GOWN DISPOSABLE) ×4
GOWN STRL REUS W/TWL XL LVL3 (GOWN DISPOSABLE) ×8
GRAFT BNE CANC CHIPS 1-8 20CC (Bone Implant) IMPLANT
HEMOSTAT POWDER KIT SURGIFOAM (HEMOSTASIS) ×2 IMPLANT
INSERT FOGARTY 61MM (MISCELLANEOUS) IMPLANT
INSERT FOGARTY SM (MISCELLANEOUS) IMPLANT
KIT BASIN OR (CUSTOM PROCEDURE TRAY) ×7 IMPLANT
KIT INFUSE MEDIUM (Orthopedic Implant) ×1 IMPLANT
KIT INFUSE X SMALL 1.4CC (Orthopedic Implant) ×2 IMPLANT
KIT ROOM TURNOVER OR (KITS) ×7 IMPLANT
LOOP VESSEL MAXI BLUE (MISCELLANEOUS) IMPLANT
LOOP VESSEL MINI RED (MISCELLANEOUS) IMPLANT
MILL MEDIUM DISP (BLADE) ×4 IMPLANT
MODULE POWER NUVASIVE (MISCELLANEOUS) IMPLANT
NDL HYPO 25X1 1.5 SAFETY (NEEDLE) ×3 IMPLANT
NDL SPNL 18GX3.5 QUINCKE PK (NEEDLE) IMPLANT
NEEDLE HYPO 22GX1.5 SAFETY (NEEDLE) ×3 IMPLANT
NEEDLE HYPO 25X1 1.5 SAFETY (NEEDLE) ×4 IMPLANT
NEEDLE SPNL 18GX3.5 QUINCKE PK (NEEDLE) ×4 IMPLANT
NS IRRIG 1000ML POUR BTL (IV SOLUTION) ×7 IMPLANT
OIL CARTRIDGE MAESTRO DRILL (MISCELLANEOUS)
PACK LAMINECTOMY NEURO (CUSTOM PROCEDURE TRAY) ×8 IMPLANT
PAD ARMBOARD 7.5X6 YLW CONV (MISCELLANEOUS) ×23 IMPLANT
PATTIES SURGICAL .5 X1 (DISPOSABLE) ×4 IMPLANT
PLASMABLADE 3.0S (MISCELLANEOUS) ×8
POWER MODULE NUVASIVE (MISCELLANEOUS) ×4
ROD RELINE-O 5.5X300 STRT NS (Rod) IMPLANT
ROD RELINE-O 5.5X300MM STRT (Rod) ×8 IMPLANT
SCREW LOCK RELINE 5.5 TULIP (Screw) ×10 IMPLANT
SCREW RELINE 7.5X 80MM 2S POLY (Screw) ×2 IMPLANT
SCREW RELINE-O 8.5X45MM POLY (Screw) ×2 IMPLANT
SCREW RELINE-O 9.5X45 2S POLY (Screw) ×1 IMPLANT
SCREW RELINE-O POLY 6.5X45 (Screw) ×5 IMPLANT
SPONGE INTESTINAL PEANUT (DISPOSABLE) ×11 IMPLANT
SPONGE LAP 18X18 X RAY DECT (DISPOSABLE) ×4 IMPLANT
SPONGE LAP 4X18 X RAY DECT (DISPOSABLE) IMPLANT
SPONGE SURGIFOAM ABS GEL 100 (HEMOSTASIS) ×7 IMPLANT
STAPLER VISISTAT 35W (STAPLE) IMPLANT
SUT PDS AB 1 CTX 36 (SUTURE) ×1 IMPLANT
SUT PROLENE 4 0 RB 1 (SUTURE)
SUT PROLENE 4-0 RB1 .5 CRCL 36 (SUTURE) IMPLANT
SUT PROLENE 5 0 CC1 (SUTURE) IMPLANT
SUT PROLENE 6 0 BV (SUTURE) IMPLANT
SUT PROLENE 6 0 C 1 30 (SUTURE) ×3 IMPLANT
SUT PROLENE 6 0 CC (SUTURE) IMPLANT
SUT SILK 0 TIES 10X30 (SUTURE) ×4 IMPLANT
SUT SILK 2 0 TIES 10X30 (SUTURE) ×7 IMPLANT
SUT SILK 2 0SH CR/8 30 (SUTURE) IMPLANT
SUT SILK 3 0 TIES 10X30 (SUTURE) ×3 IMPLANT
SUT SILK 3 0SH CR/8 30 (SUTURE) IMPLANT
SUT VIC AB 0 CT1 27 (SUTURE)
SUT VIC AB 0 CT1 27XBRD ANBCTR (SUTURE) ×6 IMPLANT
SUT VIC AB 1 CT1 18XBRD ANBCTR (SUTURE) ×6 IMPLANT
SUT VIC AB 1 CT1 8-18 (SUTURE) ×4
SUT VIC AB 2-0 CP2 18 (SUTURE) ×7 IMPLANT
SUT VIC AB 3-0 SH 8-18 (SUTURE) ×8 IMPLANT
SUT VIC AB 4-0 RB1 18 (SUTURE) ×1 IMPLANT
SUT VICRYL 4-0 PS2 18IN ABS (SUTURE) IMPLANT
SYR 3ML LL SCALE MARK (SYRINGE) ×16 IMPLANT
SYR 5ML LL (SYRINGE) IMPLANT
TOWEL GREEN STERILE (TOWEL DISPOSABLE) ×10 IMPLANT
TOWEL GREEN STERILE FF (TOWEL DISPOSABLE) ×13 IMPLANT
TRAY FOLEY W/METER SILVER 16FR (SET/KITS/TRAYS/PACK) ×7 IMPLANT
WATER STERILE IRR 1000ML POUR (IV SOLUTION) ×7 IMPLANT

## 2017-01-23 NOTE — Progress Notes (Signed)
Patient ID: Catherine [name], female   DOB: [birth year], 72 y.o.   MRN: [id number] Vital signs are stable patient notes significant back pain Will observe in intensive care unit overnight Toradol for back pain.

## 2017-01-23 NOTE — OR Nursing (Signed)
Pt c/o 7/10 back pain after Dilaudid 2mg  IV and Toradol 15mg  IV .  Down from 10++/10 pain.  Dr. Ola Spurr notified of pain.  Additional Dilaudid ordered.

## 2017-01-23 NOTE — Op Note (Signed)
Date of operation. 01-2017 Preoperative diagnosis: Pseudoarthrosis L5-S1, stenosis with retrolisthesis L2-L3. History of fusion L3 to sacrum. Postoperative diagnosis: Same Procedure: Decompression of L2-L3 via laminectomy posterior lumbar interbody arthrodesis with peek spacers local autograft and allograft and infuse L2-L3. Removal of hardware L3-L5 revision of posterior fixation at L5-S1 with fixation from L2 to the ilium. Posterior lateral arthrodesis with infuse and allograft L2-3 and L5-S1. Surgeon: Kristeen Miss First assistant: Newman Pies M.D. Anesthesia Gen. Endotracheal  Indications:Catherine [name] is a 72 year old individual who developed a spondylolisthesis at L5-S1 underwent a fusion in July 2016 and developed a pseudoarthrosis at the L5-S1 level. 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 This dictation is for the posterior portion of the procedure with the anterior procedure at L5-S1 having been completed.  Procedure: The patient was already in the operating room and had an anterior lumbar interbody arthrodesis at L5-S1. She was then turned prone onto the operating table in the back was prepped with alcohol DuraPrep and draped in a sterile fashion. Midline incision was made her previous scar from the L3-L5 decompression and fusion that she had in 2011. The dissection was carried down to expose superior aspect of the screws and then the interspace immediately above this was exposed and a subperiosteal fashion. Laminotomies at L2-L3 were then created removing the inferior marginal lamina of L2 out to and including the entirety of the facet. The yellow ligament was thickened and redundant and this area was taken down. Carefully then the disc space was explored and was noted to be a retrolisthesis  with a significant subligamentous herniation of the disc contributing to the stenosis. The disc space was opened and a complete discectomy was performed at the L2-L3 level. This allowed for good decompression of the canal centrally and lateral recesses. Then a distractor of the disc space was placed on one side well procedure was continued on the opposite side. When the disc space was completely evacuated and the endplates were decorticated trials of an appropriate sized peek spacer were performed. This felt that an 8 mm tall 4 lordotic 23 mm long spacer would fit best into the interspace. A mixture of infuse and autograft was then packed into the interspace for total Lyme of 6 mL along with 2 spacers which were packed with infuse also. The lateral gutters were then decorticated and pedicle entry sites were chosen at L2. Pedicle probe was placed with fluoroscopic guidance and a 6.5 x 45 mm screws placed in the L2 vertebrae. Next attention was turned to the L5-S1 area where the hardware was loose and from the L5-S1 area and transverse connector to extend the fusion to S1 was loosened and removed. Is clearly evident that the S1 screws were very loose and could easily be removed. The hardware at L3-L5 was then lucency potentially and this hardware was also removed it was from a different manufacturer. The lateral gutters in this area at L5-S1 were then decorticated to expose transverse process of L5 and a lateral of the sacrum. Once this area was adequately exposed first on the left side than on the right side he was decorticated. 2 pieces of infuse were then laid into this lateral gutter after completely decorticating the area and the area was then packed with 6 mL of allograft. This was performed first on the right side  and then on the left side. On the right side a 7.5 mm tap was used to tap the hole which was in the S1 screw hole and this was still felt to be loose and 8.5 x 45 mm screw was then placed into the  right side of old was noted to be tight knee 0.5 x 45 mm screws placed on the left side but this was also still loose and then ultimately a 9.5 x 45 mm screws placed into the sacrum on the left side. Then by dissecting further up the sacrum the lateral border of the ilium was identified. The ridge of the cortex at superior most aspect of the iliumwas then taken down in a small trough was created in this region then using a blunt pedicle probe and by placing a finger over the gluteus muscle to feel of the sciatic notch a blunt probe waspassed into the ileum. This was passed to the depth of 80 mm. A 7.5 mm tap was used to tap this whole and then a 7.5 x 80 mm screw was placed into the ilium first on the right side than on the left radiographs were obtained to check positioning of the screw above the notch. A transverse connector was then placed on the iliac screws. Once all the screws were set bending he was used to create of the contour of the rod to be placed between L2 and the ilium. With the old hardware from L3-L5 being removed 6.5 x 45 mm screws were placed in L3 and L5 leaving out the L4 screw as the arthrodesis was solid in this region. On the left side and a 0.5 x 45 mm screws placed in L4 has this was available from the L5 tapping. Then the rods were contoured using bending and placed into the lateral gutters. There provisionally tightened to the ilium and the sacrum and then by applying some compression lordosis was reestablished at L5-S1 the screws were then tightened at L5 and then the uppermost screws were tightened in a neutral construct. Final radiographs were obtained in AP and lateral projections identified good position of the hardware. Hemostasis in the soft tissues obtained meticulously. Retractors were removed. The lumbar dorsal fascia was then closed with #1 Vicryl interrupted fashion 2-0 Vicryl was used in subcutaneous tissues and 3-0 Vicryl subcuticularly. Dermabond was placed on the skin. No  drains were employed. Blood loss for the entirety of the 2 procedures was estimated at 900 mL. 250 mL of Cell Saver blood was returned to the patient. 20 mL of half percent Marcaine was injected into the paraspinous fascia at the end of the case. She was returned to recovery room in stable condition.

## 2017-01-23 NOTE — H&P (Addendum)
Catherine [name] is an 72 y.o. female.   Chief Complaint: Back pain status post arthrodesis L3-S1, pseudoarthrosis L5-S1, proximal junctional kyphosis L2-L3 HPI: Catherine [name] is a 72 year old individual who's had significant spondylitic problems in her back in the past. 2011 showed underwent surgical decompression and stabilization of severe stenosis at L3-4 and L4-5. She healed from that surgery fairly well but then in July 2016 she developed a herniated nucleus pulposus at L5-S1. After discectomy she had no relief and worsening of her pain in about 48 hours and was noted that she had a spondylolysis at L5-S1 that was not previously apparent. She was taken back to the operating room where she underwent surgical decompression arthrodesis at L5-S1. Postoperatively she had relief of the radicular pain but still Some difficulties with back pain that persisted and ultimately was noted that she developed a pseudoarthrosis at the level of L5-S1. She has been treated medically and had been tolerating a fair degree of activity but was requiring some increasing doses of hydrocodone to maintain a functional status despite that she notes that the pain became persistent and increasingly intolerable. 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 She's had a stable small bulge of the disc at L1-L2. After careful consideration of her options it was advised that she needs to undergo surgical decompression and stabilization at the level of L2-L3 in addition to revision of the pseudoarthrosis at L5-S1 with an anterior approach to L5-S1 and posterior stabilization from L2 to the ilium. She is now being admitted for that procedure.  Past Medical History:  Diagnosis Date  . Abnormal glandular Papanicolaou smear of cervix 11/04/2014  . Arthritis   . Back pain   . CIN I (cervical intraepithelial neoplasia I)    LEEP 2006 margins free      negative HR HPV 2008   .  Difficult intubation    needs pediatric equipment  . Fever blister   . Herniated nucleus pulposus, L5-S1 08/10/2014  . Insomnia   . Neck pain   . Pseudoarthrosis of lumbar spine   . Spasmodic dysphonia   . 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 BACK SURGERY     Fusion  . CATARACT EXTRACTION W/ INTRAOCULAR LENS  IMPLANT, BILATERAL    . CERVICAL BIOPSY  W/ LOOP ELECTRODE EXCISION  2006  . CERVICAL CONIZATION W/BX N/A 12/28/2014   Procedure: CONIZATION CERVIX WITH BIOPSY;  Surgeon: Terrance Mass, MD;  Location: [facility];  Service: Gynecology;  Laterality: N/A;  . COLONOSCOPY    . COLPOSCOPY    . DILATION AND CURETTAGE OF UTERUS    . HYSTEROSCOPY W/D&C N/A 12/28/2014   Procedure: DILATATION AND CURETTAGE /HYSTEROSCOPY Diagnostic Hysteroscopy;  Surgeon: Terrance Mass, MD;  Location: [facility];  Service: Gynecology;  Laterality: N/A;  . JOINT REPLACEMENT     Left knee  . KNEE SURGERY     Rt-95,Lft.-98,Replacement-09  . LUMBAR LAMINECTOMY/DECOMPRESSION MICRODISCECTOMY Bilateral 08/10/2014   Procedure: Bilateral Lumbar five-Sacral one Diskectomy;  Surgeon: Kristeen Miss, MD;  Location: [facility];  Service: Neurosurgery;  Laterality: Bilateral;  Bilateral L5-S1 Diskectomy  . ROTATOR CUFF REPAIR  2004  . TONSILLECTOMY    . TUBAL LIGATION      Family History  Problem Relation Age of Onset  . Hypertension Mother   . Dementia Mother   . Hypertension Father   . Diabetes Father   .  Heart disease Father   . Heart failure Father   . Breast cancer Neg Hx    Social History:  reports that  has never smoked. she has never used smokeless tobacco. She reports that she drinks about 6.0 oz of alcohol per week. She reports that she does not use drugs.  Allergies:  Allergies  Allergen Reactions  . Oxycodone Other (See Comments)    Hallucinations    Medications Prior to Admission  Medication Sig Dispense Refill   . celecoxib (CELEBREX) 200 MG capsule Take 200 mg by mouth daily.    . Cholecalciferol (VITAMIN D) 2000 units CAPS Take 2,000 Units by mouth daily.     . Cyanocobalamin (VITAMIN B-12 PO) Take 1 tablet by mouth daily.    . diazepam (VALIUM) 5 MG tablet Take 5 mg by mouth daily as needed for muscle spasms.     Marland Kitchen HYDROcodone-acetaminophen (NORCO/VICODIN) 5-325 MG tablet Take 1 tablet by mouth daily    . Multiple Vitamin (MULTIVITAMIN) capsule Take 1 capsule by mouth 2 (two) times a week.     Vladimir Faster Glycol-Propyl Glycol (SYSTANE OP) Place 1 drop into both eyes daily as needed (for dry eyes).    . Probiotic Product (ALIGN) 4 MG CAPS Take 4 mg by mouth daily.    Marland Kitchen zolpidem (AMBIEN) 10 MG tablet Take 10 mg by mouth at bedtime.   0  . clindamycin (CLEOCIN) 2 % vaginal cream Place 1 Applicatorful vaginally at bedtime. (Patient not taking: Reported on 02/02/2016) 40 g 0  . Estradiol 10 MCG TABS vaginal tablet insert one tablet 2-3 times a weekly (Patient not taking: Reported on 01/08/2017) 18 tablet 4  . oxyCODONE-acetaminophen (PERCOCET) 5-325 MG tablet Take 1 tablet by mouth every 4 (four) hours as needed for severe pain. (Patient not taking: Reported on 01/08/2017) 30 tablet 0    No results found for this or any previous visit (from the past 48 hour(s)). No results found.  Review of Systems  Constitutional: Negative.   HENT: Negative.   Eyes: Negative.   Respiratory: Negative.   Cardiovascular: Negative.   Gastrointestinal: Negative.   Musculoskeletal: Positive for back pain.  Skin: Negative.   Neurological: Positive for tingling and focal weakness.  Endo/Heme/Allergies: Negative.   Psychiatric/Behavioral: Negative.     Blood pressure 117/66, pulse 72, temperature (!) 97.4 F (36.3 C), temperature source Oral, resp. rate 18, height 5\' 2"  (1.575 m), weight 49.1 kg (108 lb 5 oz), SpO2 98 %. Physical Exam  Constitutional: She is oriented to person, place, and time. She appears  well-developed and well-nourished.  HENT:  Head: Normocephalic and atraumatic.  Eyes: Conjunctivae and EOM are normal. Pupils are equal, round, and reactive to light.  Neck: Normal range of motion. Neck supple.  Cardiovascular: Normal rate.  Respiratory: Effort normal and breath sounds normal.  GI: Bowel sounds are normal.  Musculoskeletal:  Moderate paravertebral spasm and tenderness in the back to palpation and percussion. Straight leg raising is positive at 30 in either lower extremity. Patrick's maneuver is negative bilaterally.  Neurological: She is alert and oriented to person, place, and time.  Mild weakness in tibialis anterior groups bilaterally. Absent patellar and Achilles reflexes bilaterally.  Cranial nerve exam is normal station and gait are intact.  Skin: Skin is warm and dry.  Psychiatric: She has a normal mood and affect. Her behavior is normal. Judgment and thought content normal.     Assessment/Plan Spondylosis and stenosis L2-L3, pseudoarthrosis L5-S1.  Procedure: Decompression of L2-L3  with posterior lumbar interbody arthrodesis, anterior lumbar arthrodesis with removal of pseudoarthrosis L5-S1 pedicle screw fixation L2 to the ilium, allograft and infuse.  Earleen Newport, MD 01/23/2017, 7:48 AM

## 2017-01-23 NOTE — Transfer of Care (Signed)
Immediate Anesthesia Transfer of Care Note  Patient: Catherine [name]  Procedure(s) Performed: Revision of Lumbar five-Sacral One Fusion with Anterior lumbar interbody fusion, Dr. Sherren Mocha Early to co surgeon (N/A Abdomen) Posterior Decompression of Lumbar Two-Three with Revision of Fixation Lumbar Two-Illeum (N/A Spine Lumbar) ABDOMINAL EXPOSURE (N/A )  Patient Location: PACU  Anesthesia Type:General  Level of Consciousness: awake, oriented, patient cooperative and responds to stimulation  Airway & Oxygen Therapy: Patient Spontanous Breathing and Patient connected to nasal cannula oxygen  Post-op Assessment: Report given to RN, Post -op Vital signs reviewed and stable and Patient moving all extremities X 4  Post vital signs: Reviewed and stable  Last Vitals:  Vitals:   01/23/17 0715 01/23/17 1700  BP: 117/66 (!) 92/47  Pulse: 72 91  Resp: 18 16  Temp: (!) 36.3 C (!) 36.4 C  SpO2: 98% 100%    Last Pain:  Vitals:   01/23/17 0715  TempSrc: Oral  PainSc:          Complications: No apparent anesthesia complications

## 2017-01-23 NOTE — Op Note (Signed)
Date of surgery: 01/23/2017 Preoperative diagnosis: Pseudoarthrosis L5-S1 Postoperative diagnosis: Same Procedure: Revision of pseudoarthrosis L5-S1 via anterior interbody arthrodesis with titanium spacer allograft and infuse. Surgeon: Kristeen Miss Approach: Dr. Sherren Mocha early M.D. Anesthesia: Gen. Endotracheal Indications: Catherine [name] is a 72 year old individual who's had a pseudoarthrosis develop at the level of L5-S1 having had surgery to decompress and stabilize L5-S1 in 2016. She's had significant pain in her low back and has had evidence of loosening hardware over the past years time. Is failed efforts at conservative management to see if the arthrodesis would heal. She's been advised regarding revision surgery and this is being donefrom the two-part operation with an anterior interbody technique being used at L5-S1 followed by posterior surgery to stabilize her spine.  Procedure: The patient was brought to the operating supine on a stretcher. After the smooth induction of general endotracheal anesthesiathe abdomen was prepped with alcohol DuraPrep and draped in a sterile fashion. Fluoroscopic guidance was used to localize an entry site to the approach at L5-S1. Then Dr. Sherren Mocha early performed as the openingto include retroperitoneal dissection to the prevertebral space at L5-S1. Once retractors were in place I started my portion of the operation by isolating the endplates and opening the anterior longitudinal ligament. There is noted to be2 prominent masses ventrally which corresponded to the peek spacers that had been placed. By dissecting around these the spacers could easily bat identified and then gradually mobilized. There are removed via the ventral approach. Then significant grumous material in the disc space was encountered this was mostly fibrotic scar tissue. As was carefully removed from the endplates and decorticated from bone. Left side of the interspace was noted to be somewhat collapsed  and this required some drilling of the endplate material in order to allow and even space in the interspace for placement of an anterior spacer to allow for the appropriate amount of lordosis which was felt to be 20. Once the endplates were prepared in the spaces wereevened out a trial of the spacer was used. A 14 mm tall 20 spacer was fitted into the ventral aspect of the interspace area this allowed for good establishment of lordosis at L5-S1. The spacer was then prepared by filling with a singular piece of infuse and also filling with 10 mL of allograft cancellus bone chip. This was then placed into the interspace under fluoroscopic visualization. 3 screws were then placed to into the S1 vertebrae and 1 to L5 measuring 17 mm in length the positioning of his hardware appeared good by the fluoroscopic images obtained. Additional allograft was placed ventral to the interbody device. Then the retractors were removed carefully hemostasis was checked in the prevertebral space was somewhat felt to be adequate the anterior rectus sheath was closed with #1 PDS in a running fashion. 2-0 Vicryl was used in the subcutaneous anus tissues and 3-0 Vicryl was used to close subcuticular skin in addition to a subcuticular layer of 4-0 Vicryl. Dermabond was placed on the skin blood loss for this portion procedure was estimated proximally 500 mL. 120 mL of Cell Saver blood was returned. The second portion of the procedure will be dictated separately.

## 2017-01-23 NOTE — Op Note (Signed)
    OPERATIVE REPORT  DATE OF SURGERY: 01/23/2017  PATIENT: Catherine [name], 72 y.o. female MRN: [id number]  DOB: 12-Jan-1946  PRE-OPERATIVE DIAGNOSIS: Degenerative disc disease  POST-OPERATIVE DIAGNOSIS:  Same  PROCEDURE: Anterior exposure for L5-S1 disc surgery  SURGEON:  Curt Jews, M.D.  Co-surgeon for the exposure Dr. Kristeen Miss  ANESTHESIA: General    Total I/O In: 2850 [I.V.:2200; Blood:150; IV Piggyback:500] Out: 865 [Urine:265; Blood:600]  BLOOD ADMINISTERED: None  DRAINS: None  SPECIMEN: None  COUNTS CORRECT:  YES  PLAN OF CARE: Anterior and then posterior surgery with Dr. Ellene Route  PATIENT DISPOSITION:  PACU - hemodynamically stable  PROCEDURE DETAILS: The patient was taken to the operating placed supine position where the area the abdomen was prepped and draped in usual sterile fashion.  C-arm was brought onto the field to demonstrate the level of the L5-S1 disc and this was marked on the skin surface.  An incision was made from the midline to the left in the left lower quadrant and carried down to isolate the anterior rectus sheath.  The rectus muscle was mobilized circumferentially and the retroperitoneum was entered in the left lower quadrant.  The intraperitoneal contents were mobilized to the right.  The left ureter was identified and was mobilized to the right as well.  The L5-S1 disc was exposed below the aortic and venous bifurcation.  The middle sacral vessels were clipped and divided.  Blunt dissection over the L5-S1 disc was left to give adequate exposure for discectomy.  There was a significant amount of scarring from prior posterior instrumentation over the anterior surface of the.  The Thompson retractor was brought onto the field and the reverse lip 150 blades were positioned to the right and left of the L5-S1 disc in the 140 malleable retractors were used for anterior and inferior exposure.  The remainder of the procedure will be dictated note with Dr.  Magda Kiel, M.D., [facility] 01/23/2017 2:24 PM

## 2017-01-23 NOTE — Anesthesia Procedure Notes (Signed)
Arterial Line Insertion Start/End1/02/2017 8:43 AM, 01/23/2017 8:46 AM Performed by: Verdie Drown, CRNA, CRNA  Patient location: OR. Preanesthetic checklist: patient identified, IV checked, site marked, risks and benefits discussed, surgical consent, monitors and equipment checked, pre-op evaluation, timeout performed and anesthesia consent Right, radial was placed Catheter size: 20 G Hand hygiene performed , maximum sterile barriers used  and Seldinger technique used Allen's test indicative of satisfactory collateral circulation Attempts: 2 (Attempt L)radial per HQ, CRNA,unable to thread cath into radial artery.) Procedure performed without using ultrasound guided technique.

## 2017-01-24 ENCOUNTER — Other Ambulatory Visit: Payer: Self-pay

## 2017-01-24 LAB — BASIC METABOLIC PANEL
Anion gap: 8 (ref 5–15)
BUN: 15 mg/dL (ref 6–20)
CO2: 22 mmol/L (ref 22–32)
Calcium: 8.3 mg/dL — ABNORMAL LOW (ref 8.9–10.3)
Chloride: 106 mmol/L (ref 101–111)
Creatinine, Ser: 0.83 mg/dL (ref 0.44–1.00)
GFR calc Af Amer: >60 mL/min (ref >60)
GLUCOSE: 165 mg/dL — AB (ref 65–99)
Potassium: 3.9 mmol/L (ref 3.5–5.1)
Sodium: 136 mmol/L (ref 135–145)

## 2017-01-24 LAB — CBC
HEMATOCRIT: 22.6 % — AB (ref 36.0–46.0)
Hemoglobin: 7.8 g/dL — ABNORMAL LOW (ref 12.0–15.0)
MCH: 31.6 pg (ref 26.0–34.0)
MCHC: 34.5 g/dL (ref 30.0–36.0)
MCV: 91.5 fL (ref 78.0–100.0)
PLATELETS: 214 10*3/uL (ref 150–400)
RBC: 2.47 MIL/uL — ABNORMAL LOW (ref 3.87–5.11)
RDW: 12.4 % (ref 11.5–15.5)
WBC: 8.5 10*3/uL (ref 4.0–10.5)

## 2017-01-24 LAB — GLUCOSE, CAPILLARY: GLUCOSE-CAPILLARY: 140 mg/dL — AB (ref 65–99)

## 2017-01-24 MED ORDER — HYDROMORPHONE HCL 1 MG/ML IJ SOLN
1.0000 mg | INTRAMUSCULAR | Status: DC | PRN
  Administered 2017-01-24 – 2017-01-27 (×5): 1 mg via INTRAVENOUS
  Filled 2017-01-24 (×5): qty 1

## 2017-01-24 MED ORDER — ORAL CARE MOUTH RINSE: 15.0000 mL | Freq: Two times a day (BID) | OROMUCOSAL | Status: DC

## 2017-01-24 MED ORDER — DIAZEPAM 5 MG PO TABS
5.0000 mg | ORAL_TABLET | Freq: Three times a day (TID) | ORAL | Status: DC | PRN
  Administered 2017-01-25 – 2017-01-27 (×7): 5 mg via ORAL
  Filled 2017-01-24 (×7): qty 1

## 2017-01-24 MED ORDER — DEXAMETHASONE 2 MG PO TABS
2.0000 mg | ORAL_TABLET | Freq: Two times a day (BID) | ORAL | Status: DC
  Administered 2017-01-24 – 2017-01-27 (×7): 2 mg via ORAL
  Filled 2017-01-24 (×7): qty 1

## 2017-01-24 NOTE — Evaluation (Signed)
Occupational Therapy Evaluation Patient Details Name: Catherine [name] MRN: [id number] DOB: [birth year] Today's Date: 01/24/2017    History of Present Illness 72 yo admitted for L2-3PLIF with revision of L3-5 fixation. PMHx: L3-S1 fusion, arthritis, spondylosis   Clinical Impression   Pt reports she was independent with ADL PTA. Currently pt min assist with ADL and functional mobility. Began back, safety, and ADL education with pt and husband. Pt planning to d/c home with 24/7 supervision from family. Pt would benefit from continued skilled OT to address established goals.     Follow Up Recommendations  No OT follow up;Supervision - Intermittent    Equipment Recommendations  None recommended by OT    Recommendations for Other Services       Precautions / Restrictions Precautions Precautions: Fall;Back Precaution Booklet Issued: No Precaution Comments: Reviewed 3/3 back precautions with pt Required Braces or Orthoses: Spinal Brace Spinal Brace: Applied in sitting position Restrictions Weight Bearing Restrictions: No      Mobility Bed Mobility Overal bed mobility: Needs Assistance Bed Mobility: Rolling;Sit to Sidelying Rolling: Supervision       Sit to sidelying: Min guard General bed mobility comments: Cues for log roll. Increased time and effort but no physical assist  Transfers Overall transfer level: Needs assistance Equipment used: None Transfers: Sit to/from Stand Sit to Stand: Min assist         General transfer comment: for balance    Balance Overall balance assessment: Needs assistance Sitting-balance support: Feet supported;No upper extremity supported Sitting balance-Leahy Scale: Good     Standing balance support: Single extremity supported Standing balance-Leahy Scale: Fair                             ADL either performed or assessed with clinical judgement   ADL Overall ADL's : Needs assistance/impaired Eating/Feeding: Set  up;Sitting   Grooming: Min guard;Standing;Wash/dry hands;Oral care Grooming Details (indicate cue type and reason): Educated on use of 2 cups for oral care Upper Body Bathing: Set up;Sitting   Lower Body Bathing: Minimal assistance;Sit to/from stand   Upper Body Dressing : Set up;Sitting Upper Body Dressing Details (indicate cue type and reason): for brace management Lower Body Dressing: Minimal assistance;Sit to/from stand Lower Body Dressing Details (indicate cue type and reason): Educated on compensatory strategies for LB ADL; pt needs further practice Toilet Transfer: Minimal assistance;Ambulation;Comfort height toilet;Grab bars   Toileting- Clothing Manipulation and Hygiene: Min guard;Sit to/from stand Toileting - Clothing Manipulation Details (indicate cue type and reason): for peri care     Functional mobility during ADLs: Minimal assistance(HHA for balance) General ADL Comments: Educated pt on maintaining back precautions during functional activities, frequent mobility throughout the day, brace management and wear schedule.     Vision         Perception     Praxis      Pertinent Vitals/Pain Pain Assessment: 0-10 Pain Score: 5  Pain Location: back Pain Descriptors / Indicators: Aching;Sore Pain Intervention(s): Monitored during session;Repositioned;Patient requesting pain meds-RN notified     Hand Dominance     Extremity/Trunk Assessment Upper Extremity Assessment Upper Extremity Assessment: Overall WFL for tasks assessed   Lower Extremity Assessment Lower Extremity Assessment: Defer to PT evaluation   Cervical / Trunk Assessment Cervical / Trunk Assessment: Other exceptions Cervical / Trunk Exceptions: post surgical guarding   Communication Communication Communication: No difficulties   Cognition Arousal/Alertness: Awake/alert Behavior During Therapy: WFL for tasks assessed/performed Overall Cognitive Status:  Within Functional Limits for tasks  assessed                                     General Comments       Exercises     Shoulder Instructions      Home Living Family/patient expects to be discharged to:: Private residence Living Arrangements: Spouse/significant other Available Help at Discharge: Family;Available 24 hours/day Type of Home: House Home Access: Stairs to enter [employer] of Steps: 1   Home Layout: Able to live on main level with bedroom/bathroom;Two level     Bathroom Shower/Tub: Occupational psychologist: Standard     Home Equipment: Environmental consultant - 2 wheels;Crutches;Shower seat - built in          Prior Functioning/Environment Level of Independence: Independent        Comments: does pilates and plays golf        OT Problem List: Impaired balance (sitting and/or standing);Decreased knowledge of use of DME or AE;Decreased knowledge of precautions;Pain      OT Treatment/Interventions: Self-care/ADL training;DME and/or AE instruction;Therapeutic activities;Patient/family education;Balance training    OT Goals(Current goals can be found in the care plan section) Acute Rehab OT Goals Patient Stated Goal: return to golf and pilates OT Goal Formulation: With patient/family Time For Goal Achievement: 02/07/17 Potential to Achieve Goals: Good ADL Goals Pt Will Perform Lower Body Bathing: (P) with supervision;sit to/from stand Pt Will Perform Lower Body Dressing: (P) with supervision;sit to/from stand Pt Will Perform Tub/Shower Transfer: (P) with supervision;Shower transfer;ambulating;shower seat Additional ADL Goal #1: (P) Pt will independently verbally recall 3/3 back precautions and maintain throughout ADL. Additional ADL Goal #2: (P) Pt will perform log roll for bed mobility with mod I.  OT Frequency: Min 2X/week   Barriers to D/C:            Co-evaluation              AM-PAC PT "6 Clicks" Daily Activity     Outcome Measure Help from another  person eating meals?: None Help from another person taking care of personal grooming?: A Little Help from another person toileting, which includes using toliet, bedpan, or urinal?: A Little Help from another person bathing (including washing, rinsing, drying)?: A Little Help from another person to put on and taking off regular upper body clothing?: None Help from another person to put on and taking off regular lower body clothing?: A Little 6 Click Score: 20   End of Session Equipment Utilized During Treatment: Back brace Nurse Communication: Mobility status;Patient requests pain meds  Activity Tolerance: Patient tolerated treatment well Patient left: in bed;with call bell/phone within reach;with family/visitor present  OT Visit Diagnosis: Unsteadiness on feet (R26.81);Pain Pain - part of body: (back)                Time: 5916-3846 OT Time Calculation (min): 23 min Charges:  OT General Charges $OT Visit: 1 Visit OT Evaluation $OT Eval Moderate Complexity: 1 Mod OT Treatments $Self Care/Home Management : 8-22 mins G-Codes:     Camri Molloy A. Ulice Brilliant, M.S., OTR/L Pager: [place] 01/24/2017, 3:31 PM

## 2017-01-24 NOTE — Progress Notes (Signed)
Patient ID: Catherine [name], female   DOB: August 19, 1945, 73 y.o.   MRN: [id number] Pain has improved from immediately postop. No nausea or vomiting. Actually reports that she slept relatively well last night. Abdomen soft and nontender.  2+ dorsalis pedis pulses bilaterally Stable postop day 1.  Plan per Dr. Ellene Route

## 2017-01-24 NOTE — Evaluation (Signed)
Physical Therapy Evaluation Patient Details Name: Catherine [name] MRN: [id number] DOB: 06-28-1945 Today's Date: 01/24/2017   History of Present Illness  72 yo admitted for L2-3PLIF with revision of L3-5 fixation. PMHx: L3-S1 fusion, arthritis, spondylosis  Clinical Impression  Pt very pleasant and determined to mobilize. Pt with slight dizziness with initial gait and nausea after toileting but received meds from RN and determined to ambulate today. Pt assisted with HHA for gait as she denied use of RW at this time. Pt with decreased activity tolerance, transfers and gait who will benefit from acute therapy to maximize mobility, function and independence to decrease burden of care.      Follow Up Recommendations DC plan and follow up therapy as arranged by surgeon    Equipment Recommendations  None recommended by PT    Recommendations for Other Services       Precautions / Restrictions Precautions Precautions: Fall;Back Required Braces or Orthoses: Spinal Brace Spinal Brace: Applied in sitting position Restrictions Weight Bearing Restrictions: No      Mobility  Bed Mobility Overal bed mobility: Needs Assistance Bed Mobility: Sidelying to Sit;Sit to Sidelying;Rolling Rolling: Min guard Sidelying to sit: Min assist     Sit to sidelying: Min assist General bed mobility comments: cues for sequence with assist to elevate trunk and to elevate legs with return to bed  Transfers Overall transfer level: Needs assistance   Transfers: Sit to/from Stand Sit to Stand: Min assist         General transfer comment: min assist to rise from bed, chair and toilet with cues for hand placement  Ambulation/Gait Ambulation/Gait assistance: Min assist Ambulation Distance (Feet): 150 Feet Assistive device: 1 person hand held assist Gait Pattern/deviations: Step-through pattern;Decreased stride length   Gait velocity interpretation: Below normal speed for age/gender General Gait  Details: pt cautious with slow, guarded gait. Pt walked 15' to bathroom, 10' to chair with seated rest due to nausea then 150' with HHA prior to return to bed. Cues for posture and direction  Stairs            Wheelchair Mobility    Modified Rankin (Stroke Patients Only)       Balance Overall balance assessment: No apparent balance deficits (not formally assessed)                                           Pertinent Vitals/Pain Pain Assessment: 0-10 Pain Score: 7  Pain Location: incision Pain Descriptors / Indicators: Aching Pain Intervention(s): Limited activity within patient's tolerance;Repositioned;Monitored during session;Patient requesting pain meds-RN notified    Home Living Family/patient expects to be discharged to:: Private residence Living Arrangements: Spouse/significant other Available Help at Discharge: Family;Available 24 hours/day Type of Home: House Home Access: Stairs to enter   [employer] of Steps: 1 Home Layout: Able to live on main level with bedroom/bathroom;Two level Home Equipment: Walker - 2 wheels;Crutches      Prior Function Level of Independence: Independent         Comments: does pilates and plays golf     Hand Dominance        Extremity/Trunk Assessment   Upper Extremity Assessment Upper Extremity Assessment: Defer to OT evaluation    Lower Extremity Assessment Lower Extremity Assessment: Generalized weakness    Cervical / Trunk Assessment Cervical / Trunk Assessment: Other exceptions Cervical / Trunk Exceptions: post surgical guarding  Communication   Communication: No difficulties  Cognition Arousal/Alertness: Awake/alert Behavior During Therapy: WFL for tasks assessed/performed Overall Cognitive Status: Within Functional Limits for tasks assessed                                        General Comments      Exercises     Assessment/Plan    PT Assessment  Patient needs continued PT services  PT Problem List Decreased mobility;Decreased activity tolerance;Decreased knowledge of use of DME;Pain;Decreased knowledge of precautions       PT Treatment Interventions Gait training;Therapeutic exercise;Patient/family education;Stair training;Functional mobility training;DME instruction;Therapeutic activities    PT Goals (Current goals can be found in the Care Plan section)  Acute Rehab PT Goals Patient Stated Goal: return to golf and pilates PT Goal Formulation: With patient/family Time For Goal Achievement: 02/07/17 Potential to Achieve Goals: Good    Frequency Min 5X/week   Barriers to discharge        Co-evaluation               AM-PAC PT "6 Clicks" Daily Activity  Outcome Measure Difficulty turning over in bed (including adjusting bedclothes, sheets and blankets)?: A Lot Difficulty moving from lying on back to sitting on the side of the bed? : Unable Difficulty sitting down on and standing up from a chair with arms (e.g., wheelchair, bedside commode, etc,.)?: A Lot Help needed moving to and from a bed to chair (including a wheelchair)?: A Little Help needed walking in hospital room?: A Little Help needed climbing 3-5 steps with a railing? : A Little 6 Click Score: 14    End of Session Equipment Utilized During Treatment: Back brace Activity Tolerance: Patient tolerated treatment well Patient left: in bed;with call bell/phone within reach;with family/visitor present Nurse Communication: Mobility status;Precautions PT Visit Diagnosis: Other abnormalities of gait and mobility (R26.89)    Time: 1308-6578 PT Time Calculation (min) (ACUTE ONLY): 39 min   Charges:   PT Evaluation $PT Eval Moderate Complexity: 1 Mod PT Treatments $Gait Training: 8-22 mins $Therapeutic Activity: 8-22 mins   PT G Codes:        Elwyn Reach, PT [phone]   [place] 01/24/2017, 10:08 AM

## 2017-01-24 NOTE — Progress Notes (Signed)
Dr Ellene Route called at 1119 regarding patient's low bp (77/53 at 1100, 94/54 at 1120).  Heart rate is in the 80s and 90s. Hg resulted at 7.8, down from 8.8 yesterday. I left a message on his cell phone and will wait for return call. Patient is alert, oriented and asymptomatic.   Husband, Dr. Kellie Simmering, is at bedside and aware of vital signs, etc.  He is not concerned at this time. Will continue to monitor patient. [place], [place] C 01/24/17

## 2017-01-24 NOTE — Progress Notes (Signed)
Patient ID: Catherine [name], female   DOB: 06-Jan-1946, 72 y.o.   MRN: [id number] Vital signs stable  Motor function intact Cbc Bmet pending Will add small doses decadron for pain control.

## 2017-01-24 NOTE — Progress Notes (Signed)
Orthopedic Tech Progress Note Patient Details:  Catherine [name] [birth year] [id number]          [place] Pink 01/24/2017, 8:32 [facility] [employer] for Lumbar brace.

## 2017-01-24 NOTE — Anesthesia Postprocedure Evaluation (Signed)
Anesthesia Post Note  Patient: Catherine [name]  Procedure(s) Performed: Revision of Lumbar five-Sacral One Fusion with Anterior lumbar interbody fusion, Dr. Sherren Mocha Early to co surgeon (N/A Abdomen) Posterior Decompression of Lumbar Two-Three with Revision of Fixation Lumbar Two-Illeum (N/A Spine Lumbar) ABDOMINAL EXPOSURE (N/A )     Patient location during evaluation: PACU Anesthesia Type: General Level of consciousness: awake and alert Pain management: pain level controlled Vital Signs Assessment: post-procedure vital signs reviewed and stable Respiratory status: spontaneous breathing, nonlabored ventilation, respiratory function stable and patient connected to nasal cannula oxygen Cardiovascular status: blood pressure returned to baseline and stable Postop Assessment: no apparent nausea or vomiting Anesthetic complications: no    Last Vitals:  Vitals:   01/24/17 0500 01/24/17 0600  BP: (!) 102/55 (!) 86/49  Pulse: 97 88  Resp: 14 11  Temp:    SpO2: 90% 100%    Last Pain:  Vitals:   01/24/17 0715  TempSrc:   PainSc: 5                  Tiajuana Amass

## 2017-01-24 NOTE — Progress Notes (Signed)
Patient admitted from ICU. Patient states that pain is 7/10 in lower back.Patient oriented to room, all belongings placed at bedside.  Husband aware of transfer, patient aware of need to wear back brace when OOB. Will continue to monitor closely.

## 2017-01-25 ENCOUNTER — Encounter (HOSPITAL_COMMUNITY): Payer: Self-pay | Admitting: Neurological Surgery

## 2017-01-25 ENCOUNTER — Inpatient Hospital Stay (HOSPITAL_COMMUNITY): Payer: 59

## 2017-01-25 LAB — BASIC METABOLIC PANEL
Anion gap: 10 (ref 5–15)
BUN: 13 mg/dL (ref 6–20)
CHLORIDE: 97 mmol/L — AB (ref 101–111)
CO2: 25 mmol/L (ref 22–32)
Calcium: 8.5 mg/dL — ABNORMAL LOW (ref 8.9–10.3)
Creatinine, Ser: 0.7 mg/dL (ref 0.44–1.00)
Glucose, Bld: 151 mg/dL — ABNORMAL HIGH (ref 65–99)
POTASSIUM: 4.1 mmol/L (ref 3.5–5.1)
SODIUM: 132 mmol/L — AB (ref 135–145)

## 2017-01-25 MED ORDER — LOPERAMIDE HCL 2 MG PO CAPS
2.0000 mg | ORAL_CAPSULE | ORAL | Status: DC | PRN
  Administered 2017-01-25 – 2017-01-26 (×3): 4 mg via ORAL
  Filled 2017-01-25 (×3): qty 2

## 2017-01-25 MED ORDER — DIAZEPAM 5 MG/ML IJ SOLN: 5.0000 mg | INTRAMUSCULAR | Status: DC

## 2017-01-25 MED ORDER — MAGNESIUM HYDROXIDE 400 MG/5ML PO SUSP
30.0000 mL | ORAL | Status: AC
  Administered 2017-01-25: 30 mL via ORAL
  Filled 2017-01-25: qty 30

## 2017-01-25 MED ORDER — LORAZEPAM 2 MG/ML IJ SOLN: 1.0000 mg | Freq: Once | INTRAMUSCULAR | Status: DC

## 2017-01-25 MED ORDER — LORAZEPAM 2 MG/ML IJ SOLN
2.0000 mg | Freq: Once | INTRAMUSCULAR | Status: AC
  Administered 2017-01-25: 2 mg via INTRAVENOUS

## 2017-01-25 MED ORDER — LORAZEPAM 2 MG/ML IJ SOLN
INTRAMUSCULAR | Status: AC
  Administered 2017-01-25: 2 mg via INTRAVENOUS
  Filled 2017-01-25: qty 1

## 2017-01-25 NOTE — Progress Notes (Addendum)
Patient ID: Catherine [name], female   DOB: 1945-10-31, 72 y.o.   MRN: [id number] Vital signs ok but patient having significant pain with rigors this evening apparently not able to get meds intime. Also has been straining for bowel movement which may be exacerbating situation. Given morphine, toradol and now valium. Would like to minimize narcotics if possible . Discussed with nursing and administration, regarding delay in getting meds and general inattention.

## 2017-01-25 NOTE — Progress Notes (Signed)
Patient ID: Catherine [name], female   DOB: Nov 17, 1945, 72 y.o.   MRN: [id number] Up walking in the hall.  Comfortable.  No issues from anterior exposure.  Following socially.

## 2017-01-25 NOTE — Progress Notes (Signed)
Occupational Therapy Treatment Patient Details Name: Catherine [name] MRN: [id number] DOB: 16-Aug-1945 Today's Date: 01/25/2017    History of present illness 72 yo admitted for L2-3PLIF with revision of L3-5 fixation. PMHx: L3-S1 fusion, arthritis, spondylosis   OT comments  Pt overall mod I with ADL. Able to recall 3/3 back precautions and maintain during functional activities. Still requiring light min HHA for balance during mobility. All OT goals met, no further acute OT needs identified. Will sign off at this time. Please re-consult if needs change.    Follow Up Recommendations  No OT follow up;Supervision - Intermittent    Equipment Recommendations  None recommended by OT    Recommendations for Other Services      Precautions / Restrictions Precautions Precautions: Back Precaution Booklet Issued: No Precaution Comments: Pt able to recall 3/3 back precautions Required Braces or Orthoses: Spinal Brace Spinal Brace: Lumbar corset;Applied in sitting position Restrictions Weight Bearing Restrictions: No       Mobility Bed Mobility Overal bed mobility: Modified Independent             General bed mobility comments: Good log roll technique. HOB flat without use of bed rails.  Transfers Overall transfer level: Modified independent                    Balance Overall balance assessment: No apparent balance deficits (not formally assessed)                                         ADL either performed or assessed with clinical judgement   ADL Overall ADL's : Needs assistance/impaired                 Upper Body Dressing : Modified independent Upper Body Dressing Details (indicate cue type and reason): for brace management Lower Body Dressing: Modified independent;Sit to/from stand Lower Body Dressing Details (indicate cue type and reason): Reviewed compensatory strategies for LB ADL. Pt able to demo LB dressing Toilet Transfer: Minimal  assistance;Ambulation Toilet Transfer Details (indicate cue type and reason): Still requiring HHA from husband for mobility         Functional mobility during ADLs: Minimal assistance(HHA for balance)       Vision       Perception     Praxis      Cognition Arousal/Alertness: Awake/alert Behavior During Therapy: WFL for tasks assessed/performed Overall Cognitive Status: Within Functional Limits for tasks assessed                                          Exercises     Shoulder Instructions       General Comments      Pertinent Vitals/ Pain       Pain Assessment: Faces Faces Pain Scale: Hurts little more Pain Location: abdomen Pain Descriptors / Indicators: Cramping Pain Intervention(s): Monitored during session;Repositioned  Home Living                                          Prior Functioning/Environment              Frequency           Progress  Toward Goals  OT Goals(current goals can now be found in the care plan section)  Progress towards OT goals: Goals met/education completed, patient discharged from OT  Acute Rehab OT Goals Patient Stated Goal: return to golf and pilates OT Goal Formulation: All assessment and education complete, DC therapy  Plan Discharge plan remains appropriate;All goals met and education completed, patient discharged from OT services    Co-evaluation                 AM-PAC PT "6 Clicks" Daily Activity     Outcome Measure   Help from another person eating meals?: None Help from another person taking care of personal grooming?: None Help from another person toileting, which includes using toliet, bedpan, or urinal?: A Little Help from another person bathing (including washing, rinsing, drying)?: None Help from another person to put on and taking off regular upper body clothing?: None Help from another person to put on and taking off regular lower body clothing?: None 6  Click Score: 23    End of Session Equipment Utilized During Treatment: Back brace  OT Visit Diagnosis: Unsteadiness on feet (R26.81);Pain Pain - part of body: (abdomen)   Activity Tolerance Patient tolerated treatment well   Patient Left in bed;with call bell/phone within reach;with family/visitor present   Nurse Communication          Time: 1441-1455 OT Time Calculation (min): 14 min  Charges: OT General Charges $OT Visit: 1 Visit OT Treatments $Self Care/Home Management : 8-22 mins  Harlym Gehling A. Ulice Brilliant, M.S., OTR/L Pager: [place] 01/25/2017, 3:22 PM

## 2017-01-25 NOTE — Progress Notes (Signed)
Physical Therapy Treatment Patient Details Name: Catherine [name] MRN: [id number] DOB: 01-16-1946 Today's Date: 01/25/2017    History of Present Illness 72 yo admitted for L2-3PLIF with revision of L3-5 fixation. PMHx: L3-S1 fusion, arthritis, spondylosis    PT Comments    Pt very pleasant and reports feeling much better today other than a period of muscle spasm last night. Pt with greatly increased mobility, gait and transfers. Pt and spouse educated for precautions, mobility and progression with verbalized understanding. Pt safe for return home and will continue to follow acutely.     Follow Up Recommendations  No PT follow up     Equipment Recommendations  None recommended by PT    Recommendations for Other Services       Precautions / Restrictions Precautions Precautions: Back Precaution Booklet Issued: Yes (comment) Precaution Comments: Reviewed 3/3 back precautions with pt Required Braces or Orthoses: Spinal Brace Spinal Brace: Applied in sitting position    Mobility  Bed Mobility               General bed mobility comments: pt standing on arrival  Transfers Overall transfer level: Modified independent                  Ambulation/Gait Ambulation/Gait assistance: Min guard Ambulation Distance (Feet): 500 Feet Assistive device: 1 person hand held assist Gait Pattern/deviations: Step-through pattern;Decreased stride length   Gait velocity interpretation: Below normal speed for age/gender General Gait Details: pt cautious with slow, guarded gait moving well with increased gait distance today and good posture   Stairs            Wheelchair Mobility    Modified Rankin (Stroke Patients Only)       Balance Overall balance assessment: No apparent balance deficits (not formally assessed)                                          Cognition Arousal/Alertness: Awake/alert Behavior During Therapy: WFL for tasks  assessed/performed Overall Cognitive Status: Within Functional Limits for tasks assessed                                        Exercises      General Comments        Pertinent Vitals/Pain Pain Score: 5  Pain Location: back Pain Descriptors / Indicators: Aching;Sore Pain Intervention(s): Limited activity within patient's tolerance;Repositioned;Monitored during session    Home Living                      Prior Function            PT Goals (current goals can now be found in the care plan section) Progress towards PT goals: Progressing toward goals    Frequency    Min 5X/week      PT Plan Current plan remains appropriate    Co-evaluation              AM-PAC PT "6 Clicks" Daily Activity  Outcome Measure  Difficulty turning over in bed (including adjusting bedclothes, sheets and blankets)?: A Little Difficulty moving from lying on back to sitting on the side of the bed? : A Little Difficulty sitting down on and standing up from a chair with arms (e.g., wheelchair, bedside commode, etc,.)?: A  Little Help needed moving to and from a bed to chair (including a wheelchair)?: A Little Help needed walking in hospital room?: A Little Help needed climbing 3-5 steps with a railing? : A Little 6 Click Score: 18    End of Session Equipment Utilized During Treatment: Back brace Activity Tolerance: Patient tolerated treatment well Patient left: in chair;with call bell/phone within reach;with family/visitor present Nurse Communication: Mobility status;Precautions PT Visit Diagnosis: Other abnormalities of gait and mobility (R26.89)     Time: 6314-9702 PT Time Calculation (min) (ACUTE ONLY): 21 min  Charges:  $Gait Training: 8-22 mins                    G Codes:       Elwyn Reach, [place] 01/25/2017, 10:36 AM

## 2017-01-25 NOTE — Progress Notes (Signed)
Patient ID: ALSACE DOWD, female   DOB: [birth year], 72 y.o.   MRN: [id number] Patient seemed more settled at this time. Back pain is under better control. Patient has had a bowel movement and continues to feel further urge to evacuate Would keep in hospital tomorrow to make sure condition stabilizes and pain is under better control with only oral medications.

## 2017-01-25 NOTE — Progress Notes (Signed)
Pt given scheduled and PRN pain medications per each request within time frame this shift. Pt pleasant, soft spoken and in no current distress this shift. Pt ambulated halls x2 with spouse for support. Spouse requested pain analgesics and anti-anxiety be given on a scheduled and often spoke up for pt. PT behaviors noted to be relaxed when spouse requested for pain analgesics. Scheduled stool softeners, prn suppository and enema given with relief this shift. VS WNL.

## 2017-01-26 ENCOUNTER — Encounter (HOSPITAL_COMMUNITY): Payer: Self-pay

## 2017-01-26 NOTE — Progress Notes (Signed)
Physical Therapy Treatment Patient Details Name: Catherine [name] MRN: [id number] DOB: Mar 21, 1945 Today's Date: 01/26/2017    History of Present Illness 72 yo admitted for L2-3PLIF with revision of L3-5 fixation. PMHx: L3-S1 fusion, arthritis, spondylosis    PT Comments    Pt progressing well with gait and mobility.  She would benefit from seeing if she can walk without the min/minguard hand held assist next session. She is walking far, but still seems like she likes that support for balance.  PT to follow acutely .      Follow Up Recommendations  No PT follow up     Equipment Recommendations  None recommended by PT    Recommendations for Other Services   NA     Precautions / Restrictions Precautions Precautions: Back Required Braces or Orthoses: Spinal Brace Spinal Brace: Lumbar corset;Applied in sitting position    Mobility  Bed Mobility Overal bed mobility: Modified Independent Bed Mobility: Rolling;Sidelying to Sit;Sit to Sidelying Rolling: Modified independent (Device/Increase time) Sidelying to sit: Modified independent (Device/Increase time)     Sit to sidelying: Modified independent (Device/Increase time) General bed mobility comments: Pt able to demonstrate good log roll and reverse log roll technique.   Transfers Overall transfer level: Needs assistance Equipment used: None Transfers: Sit to/from Stand Sit to Stand: Supervision         General transfer comment: supervision for safety, slow and guarded.   Ambulation/Gait Ambulation/Gait assistance: Min assist Ambulation Distance (Feet): 550 Feet Assistive device: 1 person hand held assist Gait Pattern/deviations: Step-through pattern;Staggering left;Staggering right Gait velocity: decreased Gait velocity interpretation: Below normal speed for age/gender General Gait Details: mildly staggering gait pattern, min hand held assist to steady patient for balance during gait.  Slow, cautious speed.         Balance Overall balance assessment: Needs assistance Sitting-balance support: Feet supported;Bilateral upper extremity supported Sitting balance-Leahy Scale: Good     Standing balance support: Bilateral upper extremity supported;Single extremity supported Standing balance-Leahy Scale: Fair                              Cognition Arousal/Alertness: Awake/alert Behavior During Therapy: WFL for tasks assessed/performed Overall Cognitive Status: Within Functional Limits for tasks assessed                                           General Comments General comments (skin integrity, edema, etc.): Pt went from constupated to diarrhea in the last 24 hours.      Pertinent Vitals/Pain Pain Assessment: Faces Faces Pain Scale: Hurts little more Pain Location: abdomen Pain Descriptors / Indicators: Cramping Pain Intervention(s): Limited activity within patient's tolerance;Monitored during session;Repositioned           PT Goals (current goals can now be found in the care plan section) Acute Rehab PT Goals Patient Stated Goal: return to golf and pilates Progress towards PT goals: Progressing toward goals    Frequency    Min 5X/week      PT Plan Current plan remains appropriate       AM-PAC PT "6 Clicks" Daily Activity  Outcome Measure  Difficulty turning over in bed (including adjusting bedclothes, sheets and blankets)?: A Little Difficulty moving from lying on back to sitting on the side of the bed? : A Little Difficulty sitting down on and standing up from  a chair with arms (e.g., wheelchair, bedside commode, etc,.)?: A Little Help needed moving to and from a bed to chair (including a wheelchair)?: None Help needed walking in hospital room?: A Little Help needed climbing 3-5 steps with a railing? : A Little 6 Click Score: 19    End of Session Equipment Utilized During Treatment: Back brace Activity Tolerance: Patient limited by  pain Patient left: in bed;with call bell/phone within reach;with family/visitor present Nurse Communication: Mobility status PT Visit Diagnosis: Other abnormalities of gait and mobility (R26.89)     Time: 3845-3646 PT Time Calculation (min) (ACUTE ONLY): 31 min  Charges:  $Gait Training: 23-37 mins          Lisel Siegrist B. Annamae Shivley, PT, DPT [phone]            01/26/2017, 3:55 PM

## 2017-01-26 NOTE — Progress Notes (Signed)
CSW met with patient to discuss safe plan for discharge. Patient stated that this was her fourth back surgery and that she knows what to do and not to once she gets home. She lives with her husband in a home without steps. She stated that she does not need any home health services and that she has a walker at home if she ever needs it. CSW informed patient to please reach out if further needs arise prior to discharge.  CSW signing off.  Madilyn Fireman, MSW, LCSW-A Weekend Clinical Social Worker [phone]

## 2017-01-26 NOTE — Progress Notes (Signed)
Patient ID: Catherine [name], female   DOB: 1945/03/11, 72 y.o.   MRN: [id number] BP (!) 101/55   Pulse 98   Temp 97.8 F (36.6 C) (Oral)   Resp (!) 25   Ht 5\' 2"  (1.575 m)   Wt 55.2 kg (121 lb 11.1 oz)   SpO2 92%   BMI 22.26 kg/m  Alert and oriented x 4 Moving all extremities well Wounds are clean, dry, and without signs of infection Still with diarrhea Will monitor closely

## 2017-01-27 MED ORDER — DEXAMETHASONE 2 MG PO TABS: 2.0000 mg | ORAL_TABLET | Freq: Two times a day (BID) | ORAL | 0 refills | Status: DC

## 2017-01-27 MED ORDER — METHOCARBAMOL 500 MG PO TABS: 500.0000 mg | ORAL_TABLET | Freq: Four times a day (QID) | ORAL | 0 refills | Status: DC | PRN

## 2017-01-27 MED ORDER — DIAZEPAM 5 MG PO TABS: 5.0000 mg | ORAL_TABLET | Freq: Three times a day (TID) | ORAL | 0 refills | Status: DC | PRN

## 2017-01-27 MED ORDER — HYDROCODONE-ACETAMINOPHEN 5-325 MG PO TABS: 1.0000 | ORAL_TABLET | Freq: Four times a day (QID) | ORAL | 0 refills | Status: DC | PRN

## 2017-01-27 NOTE — Discharge Instructions (Signed)

## 2017-01-27 NOTE — Discharge Summary (Signed)
Physician Discharge Summary  Patient ID: Catherine [name] MRN: [id number] DOB/AGE: 06/27/1945 72 y.o.  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 Removal of hardware L3-L5 revision of posterior fixation at L5-S1 with fixation from L2 to the ilium. Posterior lateral arthrodesis with infuse and allograft L2-3 and L5-S1. Postoperatively she had a rocky course with pain and constipation. We were able to alleviate these problems and at discharge she is dealing with her pain with po medication. Her wound is clean, dry, and without signs of infection. She is ambulating, and tolerating a regular diet, along with voiding. She will be discharged to home    Treatments: surgery: Decompression of L2-L3 via laminectomy posterior lumbar interbody arthrodesis with peek spacers local autograft and allograft and infuse L2-L3. Removal of hardware L3-L5 revision of posterior fixation at L5-S1 with fixation from L2 to the ilium. Posterior lateral arthrodesis with infuse and allograft L2-3 and L5-S1.    Discharge Exam: Blood pressure (!) 96/55, pulse 79, temperature 97.7 F (36.5 C), temperature source Oral, resp. rate 10, height 5\' 2"  (1.575 m), weight 55.2 kg (121 lb 11.1 oz), SpO2 100 %. General appearance: alert, cooperative, appears stated age and mild distress Neurologic: Alert and oriented X 3, normal strength and tone. Normal symmetric reflexes. Normal coordination and gait  Disposition: 01-Home or Self Care Pseudoarthrosis of lumbar spine Discharge Instructions    Incentive spirometry RT   Complete by:  As  directed    Incentive spirometry RT   Complete by:  As directed       Follow-up Information    Kristeen Miss, MD Follow up in 3 week(s).   Specialty:  Neurosurgery Why:  please call the office to make an appointment Contact information: [address] [place] [address] [phone]           Signed: Winfield Cunas 01/27/2017, 10:43 AM

## 2017-01-28 ENCOUNTER — Other Ambulatory Visit: Payer: Self-pay | Admitting: *Deleted

## 2017-01-28 MED FILL — METHOCARBAMOL 500 MG TABS: 500 | 15 days supply | Qty: 60 | Fill #0

## 2017-01-28 MED FILL — HYDROCODON-APAP 5-325: 5-325 | 7 days supply | Qty: 56 | Fill #0

## 2017-01-28 MED FILL — diazePAM 5 MG TABS: 5 | 10 days supply | Qty: 40 | Fill #0

## 2017-01-28 NOTE — Patient Outreach (Signed)
[facility]) Care Management  01/28/2017  Catherine [name] 12/02/1945 [id number]  Subjective: Telephone call to patient's home number, no answer, left HIPAA compliant voicemail message, and requested call back. Telephone call from patient's husband (Dr. Kellie Simmering), states he is returning call, left HIPAA compliant message for [name], and his wife can be reached at [phone] (mobile).    Objective: Per KPN (Knowledge Performance Now, point of care tool) and chart review, patient hospitalized 01/23/17 -01/27/17 for Lumbar pseudoarthrosis.   Status post Decompression of L2-L3 via laminectomy posterior lumbar interbody arthrodesis with peek spacers local autograft and allograft and infuse L2-L3. Removal of hardware L3-L5 revision of posterior fixation at L5-S1 with fixation from L2 to the ilium. Posterior lateral arthrodesis with infuse and allograft L2-3 and L5-S1.  Revision of pseudoarthrosis L5-S1 via anterior interbody arthrodesis with titanium spacer allograft and infuse.  Anterior exposure for L5-S1 disc surgery  on 01/23/17.   Patient also has a history of Herniated nucleus pulposus, L5-S1, and status post back surgery.    Assessment:  Received UMR Preoperative / Transition of care referral on 01/16/17.   Transition of care follow up pending patient contact.      Plan: RNCM will call patient for 2nd telephone outreach attempt, transition of care follow up, within 10 business days if no return call.      Annais Crafts H. Annia Friendly, BSN, [place] Management [facility] Telephonic CM Phone: [phone] Fax: [phone]

## 2017-01-29 ENCOUNTER — Ambulatory Visit: Payer: Self-pay | Admitting: *Deleted

## 2017-01-29 ENCOUNTER — Other Ambulatory Visit: Payer: Self-pay | Admitting: *Deleted

## 2017-01-29 MED FILL — CEPHALEXIN 500 MG CAPSULE: 500 | 10 days supply | Qty: 40 | Fill #0

## 2017-01-29 MED FILL — ONDANSETRON HCL 4 MG TABLET: 4 | 10 days supply | Qty: 20 | Fill #0

## 2017-01-29 NOTE — Patient Outreach (Signed)
[facility]) Care Management  01/29/2017  ALIEGHA PAULLIN 28-Apr-1945 [id number]   Subjective: Telephone call to patient's mobile number, no answer, left HIPAA compliant voicemail message, and requested call back.    Objective: Per KPN (Knowledge Performance Now, point of care tool) and chart review, patient hospitalized 01/23/17 -01/27/17 for Lumbar pseudoarthrosis.   Status post Decompression of L2-L3 via laminectomy posterior lumbar interbody arthrodesis with peek spacers local autograft and allograft and infuse L2-L3. Removal of hardware L3-L5 revision of posterior fixation at L5-S1 with fixation from L2 to the ilium. Posterior lateral arthrodesis with infuse and allograft L2-3 and L5-S1.  Revision of pseudoarthrosis L5-S1 via anterior interbody arthrodesis with titanium spacer allograft and infuse.  Anterior exposure for L5-S1 disc surgery  on 01/23/17.   Patient also has a history of Herniated nucleus pulposus, L5-S1, and status post back surgery.    Assessment:  Received UMR Preoperative / Transition of care referral on 01/16/17.   Transition of care follow up pending patient contact.      Plan: RNCM will call patient for 3rd telephone outreach attempt, transition of care follow up, within 10 business days if no return call.      Alyn Jurney H. Annia Friendly, BSN, [place] Management [facility] Telephonic CM Phone: [phone] Fax: [phone]

## 2017-01-30 ENCOUNTER — Ambulatory Visit: Payer: Self-pay | Admitting: *Deleted

## 2017-01-30 ENCOUNTER — Other Ambulatory Visit: Payer: Self-pay | Admitting: *Deleted

## 2017-01-30 ENCOUNTER — Encounter: Payer: Self-pay | Admitting: *Deleted

## 2017-01-30 MED FILL — Sodium Chloride IV Soln 0.9%: INTRAVENOUS | Qty: 1000 | Status: AC

## 2017-01-30 MED FILL — Heparin Sodium (Porcine) Inj 1000 Unit/ML: INTRAMUSCULAR | Qty: 30 | Status: AC

## 2017-01-30 MED FILL — Sodium Chloride Irrigation Soln 0.9%: Qty: 3000 | Status: AC

## 2017-01-30 NOTE — Patient Outreach (Addendum)
[facility]) Care Management  01/30/2017  NAWAAL ALLING 1945-10-10 [id number]   Subjective: Telephone call to patient's mobile number, spoke with patient, and HIPAA verified.  Discussed [facility] Care Management UMR Transition of care follow up, patient voiced understanding, and is in agreement to follow up.  Patient states she is doing great, except for back pain, medication managing pain, and has follow up appointment with surgeon on 02/14/17.  Patient states she is able to manage self care and has assistance as needed.  Patient voices understanding of medical diagnosis, surgery, and treatment plan.  States she is accessing the following [employer] benefits: outpatient pharmacy, hospital indemnity (will ask her husband, [employer] employee  to follow up, verify benefit, and will fill claim if appropriate), and husband does not need to take family medical leave act (FMLA) at this time.  Patient states she does not have any education material, transition of care, care coordination, disease management, disease monitoring, transportation, community resource, or pharmacy needs at this time.  States she is very appreciative of the follow up and is in agreement to receive [place] Management information.    Objective:Per KPN (Knowledge Performance Now, point of care tool) and chart review,patient hospitalized 01/23/17 -01/27/17 forLumbar pseudoarthrosis. Status post Decompression of L2-L3 via laminectomy posterior lumbar interbody arthrodesis with peek spacers local autograft and allograft and infuse L2-L3. Removal of hardware L3-L5 revision of posterior fixation at L5-S1 with fixation from L2 to the ilium. Posterior lateral arthrodesis with infuse and allograft L2-3 and L5-S1.Revision of pseudoarthrosis L5-S1 via anterior interbody arthrodesis with titanium spacer allograft and infuse.Anterior exposure for L5-S1 disc surgeryon 01/23/17.Patient also has a history ofHerniated nucleus pulposus, L5-S1,  and status post back surgery.    Assessment: Received UMR Preoperative / Transition of care referral on 01/16/17. Transition of care follow up completed, no care management needs, and will proceed with case closure.       Plan:RNCM will send patient successful outreach letter, [facility] pamphlet, and magnet. RNCM will send case closure due to follow up completed / no care management needs request to [facility] Management.     Breylan Lefevers H. Annia Friendly, BSN, [place] Management [facility] Telephonic CM Phone: [phone] Fax: [phone]

## 2017-01-31 ENCOUNTER — Other Ambulatory Visit: Payer: Self-pay | Admitting: *Deleted

## 2017-01-31 NOTE — Patient Outreach (Signed)
[facility]) Care Management  01/31/2017  Catherine [name] [birth year] [id number]   Subjective:  Received voicemail message from patient's husband Dr. Tinnie Gens), states he has questions regarding hospital indemnity plan through [facility], and requested call back.  Telephone call to patient's mobile number, spoke with patient, and HIPAA verified.  States her husband has some questions.   Patient gave this RNCM verbal authorization to speak with husband Austyn Perriello) regarding her healthcare needs as needed.   Spoke with Dr. Kellie Simmering states he has downloaded hospital indemnity claim form from [facility] site and had questions regarding how to obtain requested information for claims process.  RNCM advised patient and husband to follow up with [employer] [facility] [employer] regarding their questions.  Patient and Dr. Kellie Simmering verbally given, [facility] customer service phone number [phone]) and contact number for [facility] [employer]  [phone]) to obtain itemized hospital bill.  Patient and Dr. Kellie Simmering, voice understanding, state they will follow up, and very appreciative of the information. Patient states she does not have any education material, transition of care, care coordination, disease management, disease monitoring, transportation, community resource, or pharmacy needs at this time.   Objective:Per KPN (Knowledge Performance Now, point of care tool) and chart review,patient hospitalized 01/23/17 -01/27/17 forLumbar pseudoarthrosis. Status post Decompression of L2-L3 via laminectomy posterior lumbar interbody arthrodesis with peek spacers local autograft and allograft and infuse L2-L3. Removal of hardware L3-L5 revision of posterior fixation at L5-S1 with fixation from L2 to the ilium. Posterior lateral arthrodesis with infuse and allograft L2-3 and L5-S1.Revision of pseudoarthrosis L5-S1 via anterior interbody arthrodesis with titanium spacer allograft and  infuse.Anterior exposure for L5-S1 disc surgeryon 01/23/17.Patient also has a history ofHerniated nucleus pulposus, L5-S1, and status post back surgery.    Assessment: Received [facility] Preoperative / Transition of care referral on 01/16/17. Transition of care follow up completed, no care management needs, and case will remain closed.        Plan:RNCM has sent patient successful outreach letter, [facility] pamphlet, and magnet. Case will remain closed, no further needs at this time.      Holbert Caples H. Annia Friendly, BSN, [place] [facility] Telephonic CM Phone: [phone] Fax: [phone]

## 2017-02-01 MED FILL — DOXYCYCLINE HYCLATE 100 MG: 100 | 10 days supply | Qty: 20 | Fill #0

## 2017-02-11 ENCOUNTER — Other Ambulatory Visit (HOSPITAL_COMMUNITY): Payer: Self-pay | Admitting: Neurological Surgery

## 2017-02-11 DIAGNOSIS — Z981 Arthrodesis status: Secondary | ICD-10-CM

## 2017-02-11 DIAGNOSIS — R102 Pelvic and perineal pain: Secondary | ICD-10-CM

## 2017-02-11 DIAGNOSIS — S32009K Unspecified fracture of unspecified lumbar vertebra, subsequent encounter for fracture with nonunion: Secondary | ICD-10-CM

## 2017-02-13 ENCOUNTER — Ambulatory Visit (HOSPITAL_COMMUNITY): Payer: 59

## 2017-02-13 ENCOUNTER — Ambulatory Visit (HOSPITAL_COMMUNITY)
Admission: RE | Admit: 2017-02-13 | Discharge: 2017-02-13 | Disposition: A | Discharge status: Home / Self Care | Payer: 59 | Source: Ambulatory Visit | Attending: Neurological Surgery | Admitting: Neurological Surgery

## 2017-02-13 ENCOUNTER — Encounter (HOSPITAL_COMMUNITY): Payer: Self-pay

## 2017-02-13 DIAGNOSIS — M160 Bilateral primary osteoarthritis of hip: Secondary | ICD-10-CM | POA: Insufficient documentation

## 2017-02-13 DIAGNOSIS — M11252 Other chondrocalcinosis, left hip: Secondary | ICD-10-CM | POA: Insufficient documentation

## 2017-02-13 DIAGNOSIS — Z981 Arthrodesis status: Secondary | ICD-10-CM

## 2017-02-13 DIAGNOSIS — S32009K Unspecified fracture of unspecified lumbar vertebra, subsequent encounter for fracture with nonunion: Secondary | ICD-10-CM

## 2017-02-13 DIAGNOSIS — M960 Pseudarthrosis after fusion or arthrodesis: Secondary | ICD-10-CM | POA: Diagnosis not present

## 2017-02-13 DIAGNOSIS — M545 Low back pain: Secondary | ICD-10-CM | POA: Diagnosis not present

## 2017-02-13 DIAGNOSIS — M11251 Other chondrocalcinosis, right hip: Secondary | ICD-10-CM | POA: Insufficient documentation

## 2017-02-13 DIAGNOSIS — Y838 Other surgical procedures as the cause of abnormal reaction of the patient, or of later complication, without mention of misadventure at the time of the procedure: Secondary | ICD-10-CM | POA: Insufficient documentation

## 2017-02-13 DIAGNOSIS — R102 Pelvic and perineal pain: Secondary | ICD-10-CM

## 2017-02-14 DIAGNOSIS — D692 Other nonthrombocytopenic purpura: Secondary | ICD-10-CM | POA: Diagnosis not present

## 2017-02-14 DIAGNOSIS — D225 Melanocytic nevi of trunk: Secondary | ICD-10-CM | POA: Diagnosis not present

## 2017-02-14 DIAGNOSIS — D2272 Melanocytic nevi of left lower limb, including hip: Secondary | ICD-10-CM | POA: Diagnosis not present

## 2017-02-14 DIAGNOSIS — L723 Sebaceous cyst: Secondary | ICD-10-CM | POA: Diagnosis not present

## 2017-02-14 DIAGNOSIS — Z85828 Personal history of other malignant neoplasm of skin: Secondary | ICD-10-CM | POA: Diagnosis not present

## 2017-02-14 DIAGNOSIS — L814 Other melanin hyperpigmentation: Secondary | ICD-10-CM | POA: Diagnosis not present

## 2017-02-14 DIAGNOSIS — L438 Other lichen planus: Secondary | ICD-10-CM | POA: Diagnosis not present

## 2017-02-14 DIAGNOSIS — L821 Other seborrheic keratosis: Secondary | ICD-10-CM | POA: Diagnosis not present

## 2017-02-14 DIAGNOSIS — S32009K Unspecified fracture of unspecified lumbar vertebra, subsequent encounter for fracture with nonunion: Secondary | ICD-10-CM | POA: Diagnosis not present

## 2017-02-14 DIAGNOSIS — L570 Actinic keratosis: Secondary | ICD-10-CM | POA: Diagnosis not present

## 2017-02-14 MED FILL — HYDROCODON-APAP 5-325: 5-325 | 7 days supply | Qty: 60 | Fill #0

## 2017-02-14 MED FILL — diazePAM 5 MG TABS: 5 | 10 days supply | Qty: 40 | Fill #0

## 2017-03-01 MED FILL — diazePAM 5 MG TABS: 5 | 10 days supply | Qty: 40 | Fill #0

## 2017-03-01 MED FILL — HYDROCODON-APAP 5-325: 5-325 | 8 days supply | Qty: 60 | Fill #0

## 2017-03-14 ENCOUNTER — Ambulatory Visit (INDEPENDENT_AMBULATORY_CARE_PROVIDER_SITE_OTHER): Payer: 59 | Admitting: Orthopedic Surgery

## 2017-03-14 ENCOUNTER — Encounter (INDEPENDENT_AMBULATORY_CARE_PROVIDER_SITE_OTHER): Payer: Self-pay | Admitting: Orthopedic Surgery

## 2017-03-14 ENCOUNTER — Ambulatory Visit (INDEPENDENT_AMBULATORY_CARE_PROVIDER_SITE_OTHER): Payer: 59

## 2017-03-14 VITALS — Ht 62.0 in | Wt 121.0 lb

## 2017-03-14 DIAGNOSIS — M722 Plantar fascial fibromatosis: Secondary | ICD-10-CM

## 2017-03-14 DIAGNOSIS — M79672 Pain in left foot: Secondary | ICD-10-CM

## 2017-03-14 MED ORDER — METHYLPREDNISOLONE ACETATE 40 MG/ML IJ SUSP
40.0000 mg | INTRAMUSCULAR | Status: AC | PRN
  Administered 2017-03-14: 40 mg

## 2017-03-14 MED ORDER — LIDOCAINE HCL 1 % IJ SOLN
2.0000 mL | INTRAMUSCULAR | Status: AC | PRN
  Administered 2017-03-14: 2 mL

## 2017-03-14 NOTE — Progress Notes (Signed)
Office Visit Note   Patient: Catherine [name]           Date of Birth: 1945-10-04           MRN: [id number] Visit Date: 03/14/2017              Requested by: Marton Redwood, MD [address] [place], [place] [address] PCP: Marton Redwood, MD  Chief Complaint  Patient presents with  . 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  She is status post total knee arthroplasty she is status post multiple lumbar spine surgeries and presents at this time with plantar fasciitis status post extensive revision lumbar spine fusion.  Assessment & Plan: Visit Diagnoses:  1. Pain of left heel   2. Plantar fasciitis, left     Plan: The origin of the plantar fascia was injected she was given instructions and demonstrated Achilles heel cord stretching.  Recommended sole orthotics.  Will follow-up as needed.  Discussed the possibility of a steroid injection for her arthritis of the left ankle.  Follow-Up Instructions: Return if symptoms worsen or fail to improve.   Ortho Exam  Patient is alert, oriented, no adenopathy, well-dressed, normal affect, normal respiratory effort. Examination patient has a good dorsalis pedis pulse she has some mild decreased range of motion of the ankle joint but there is no significant crepitation.  The peroneal posterior tibial tendon and Achilles tendon are nontender to palpation.  She is point tender to palpation over the origin of the plantar fascia.  Lateral compression of the calcaneus is nontender.  She has no pain to palpation over the tarsal tunnel.  She does have some mild Achilles contracture with dorsiflexion about 10 degrees past neutral with her knee extended.  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  Patient does have a mild Haglund's deformity with no plantar spur.  No  evidence of a stress fracture of the calcaneus there are degenerative changes of the tibial talar joint.  No images are attached to the encounter.  Labs: Lab Results  Component Value Date   LABORGA Yeast 07/16/2014    @LABSALLVALUES (HGBA1)@  Body mass index is 22.13 kg/m.  Orders:  Orders Placed This Encounter  Procedures  . Medium Joint Inj  . Foot Inj  . XR Foot 2 Views Left   No orders of the defined types were placed in this encounter.    Procedures: Foot Inj Date/Time: 03/14/2017 12:00 PM Performed by: Newt Minion, MD Authorized by: Newt Minion, MD   Consent Given by:  Patient Site marked: the procedure site was marked   Timeout: prior to procedure the correct patient, procedure, and site was verified   Indications:  Fasciitis and pain Condition: Plantar Fasciitis   Location: left plantar fascia muscle   Prep: patient was prepped and draped in usual sterile fashion   Needle Size:  22 G Approach:  Lateral Medications:  2 mL lidocaine 1 %; 40 mg methylPREDNISolone acetate 40 MG/ML Patient Tolerance:  Patient tolerated the procedure well with no immediate complications    Clinical Data: No additional findings.  ROS:  All other systems negative, except as noted in the HPI. Review of Systems  Objective: Vital Signs: Ht 5\' 2"  (1.575 m)   Wt 121 lb (54.9 kg)  BMI 22.13 kg/m   Specialty Comments:  No specialty comments available.  PMFS History: Patient Active Problem List   Diagnosis Date Noted  . Lumbar pseudoarthrosis 01/23/2017  . Abnormal ECG 12/24/2014  . Spasmodic dysphonia   . Difficult intubation   . Insomnia   . Abnormal glandular Papanicolaou smear of cervix 11/04/2014  . Spondylolysis, lumbosacral 08/13/2014  . Herniated nucleus pulposus, L5-S1 08/10/2014  . Arthritis 04/18/2012  . CIN I (cervical intraepithelial neoplasia I)   . Fever blister    Past Medical History:  Diagnosis Date  . Abnormal glandular Papanicolaou smear  of cervix 11/04/2014  . Arthritis   . Back pain   . CIN I (cervical intraepithelial neoplasia I)    LEEP 2006 margins free      negative HR HPV 2008   . Difficult intubation    needs pediatric equipment  . Fever blister   . Herniated nucleus pulposus, L5-S1 08/10/2014  . Insomnia   . Neck pain   . Pseudoarthrosis of lumbar spine   . Spasmodic dysphonia   . 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 Hypertension Mother   . Dementia Mother   . Hypertension Father   . Diabetes Father   . Heart disease Father   . Heart failure Father   . Breast cancer Neg Hx     Past Surgical History:  Procedure Laterality Date  . ABDOMINAL EXPOSURE N/A 01/23/2017   Procedure: ABDOMINAL EXPOSURE;  Surgeon: Rosetta Posner, MD;  Location: [facility] OR;  Service: Vascular;  Laterality: N/A;  . ANTERIOR LUMBAR FUSION N/A 01/23/2017   Procedure: Revision of Lumbar five-Sacral One Fusion with Anterior lumbar interbody fusion, Dr. Sherren Mocha Early to co surgeon;  Surgeon: Kristeen Miss, MD;  Location: [facility];  Service: Neurosurgery;  Laterality: N/A;  . BACK SURGERY     Fusion  . CATARACT EXTRACTION W/ INTRAOCULAR LENS  IMPLANT, BILATERAL    . CERVICAL BIOPSY  W/ LOOP ELECTRODE EXCISION  2006  . CERVICAL CONIZATION W/BX N/A 12/28/2014   Procedure: CONIZATION CERVIX WITH BIOPSY;  Surgeon: Terrance Mass, MD;  Location: [facility];  Service: Gynecology;  Laterality: N/A;  . COLONOSCOPY    . COLPOSCOPY    . DILATION AND CURETTAGE OF UTERUS    . HYSTEROSCOPY W/D&C N/A 12/28/2014   Procedure: DILATATION AND CURETTAGE /HYSTEROSCOPY Diagnostic Hysteroscopy;  Surgeon: Terrance Mass, MD;  Location: [facility];  Service: Gynecology;  Laterality: N/A;  . JOINT REPLACEMENT     Left knee  . KNEE SURGERY     Rt-95,Lft.-98,Replacement-09  . LUMBAR LAMINECTOMY/DECOMPRESSION MICRODISCECTOMY Bilateral 08/10/2014   Procedure: Bilateral Lumbar five-Sacral one Diskectomy;   Surgeon: Kristeen Miss, MD;  Location: [facility];  Service: Neurosurgery;  Laterality: Bilateral;  Bilateral L5-S1 Diskectomy  . ROTATOR CUFF REPAIR  2004  . TONSILLECTOMY    . TUBAL LIGATION     Social History   Occupational History  . Not on file  Tobacco Use  . Smoking status: Never Smoker  . Smokeless tobacco: Never Used  Substance and Sexual Activity  . Alcohol use: Yes    Alcohol/week: 6.0 oz    Types: 10 Standard drinks or equivalent per week    Comment: daily drink  . Drug use: No  . Sexual activity: Yes    Birth control/protection: Post-menopausal, Surgical    Comment: INSURANCE QUESTIONS DECLINED

## 2017-03-27 ENCOUNTER — Ambulatory Visit: Payer: 59 | Admitting: *Deleted

## 2017-03-28 DIAGNOSIS — S32009K Unspecified fracture of unspecified lumbar vertebra, subsequent encounter for fracture with nonunion: Secondary | ICD-10-CM | POA: Diagnosis not present

## 2017-04-03 ENCOUNTER — Ambulatory Visit (INDEPENDENT_AMBULATORY_CARE_PROVIDER_SITE_OTHER): Payer: 59 | Admitting: Women's Health

## 2017-04-03 ENCOUNTER — Encounter: Payer: Self-pay | Admitting: Women's Health

## 2017-04-03 VITALS — BP 118/80 | Ht 62.0 in | Wt 108.0 lb

## 2017-04-03 DIAGNOSIS — Z01419 Encounter for gynecological examination (general) (routine) without abnormal findings: Secondary | ICD-10-CM

## 2017-04-03 NOTE — Patient Instructions (Signed)
Health Maintenance for Postmenopausal Women Menopause is a normal process in which your reproductive ability comes to an end. This process happens gradually over a span of months to years, usually between the ages of 22 and 9. Menopause is complete when you have missed 12 consecutive menstrual periods. It is important to talk with your health care provider about some of the most common conditions that affect postmenopausal women, such as heart disease, cancer, and bone loss (osteoporosis). Adopting a healthy lifestyle and getting preventive care can help to promote your health and wellness. Those actions can also lower your chances of developing some of these common conditions. What should I know about menopause? During menopause, you may experience a number of symptoms, such as:  Moderate-to-severe hot flashes.  Night sweats.  Decrease in sex drive.  Mood swings.  Headaches.  Tiredness.  Irritability.  Memory problems.  Insomnia.  Choosing to treat or not to treat menopausal changes is an individual decision that you make with your health care provider. What should I know about hormone replacement therapy and supplements? Hormone therapy products are effective for treating symptoms that are associated with menopause, such as hot flashes and night sweats. Hormone replacement carries certain risks, especially as you become older. If you are thinking about using estrogen or estrogen with progestin treatments, discuss the benefits and risks with your health care provider. What should I know about heart disease and stroke? Heart disease, heart attack, and stroke become more likely as you age. This may be due, in part, to the hormonal changes that your body experiences during menopause. These can affect how your body processes dietary fats, triglycerides, and cholesterol. Heart attack and stroke are both medical emergencies. There are many things that you can do to help prevent heart disease  and stroke:  Have your blood pressure checked at least every 1-2 years. High blood pressure causes heart disease and increases the risk of stroke.  If you are 53-22 years old, ask your health care provider if you should take aspirin to prevent a heart attack or a stroke.  Do not use any tobacco products, including cigarettes, chewing tobacco, or electronic cigarettes. If you need help quitting, ask your health care provider.  It is important to eat a healthy diet and maintain a healthy weight. ? Be sure to include plenty of vegetables, fruits, low-fat dairy products, and lean protein. ? Avoid eating foods that are high in solid fats, added sugars, or salt (sodium).  Get regular exercise. This is one of the most important things that you can do for your health. ? Try to exercise for at least 150 minutes each week. The type of exercise that you do should increase your heart rate and make you sweat. This is known as moderate-intensity exercise. ? Try to do strengthening exercises at least twice each week. Do these in addition to the moderate-intensity exercise.  Know your numbers.Ask your health care provider to check your cholesterol and your blood glucose. Continue to have your blood tested as directed by your health care provider.  What should I know about cancer screening? There are several types of cancer. Take the following steps to reduce your risk and to catch any cancer development as early as possible. Breast Cancer  Practice breast self-awareness. ? This means understanding how your breasts normally appear and feel. ? It also means doing regular breast self-exams. Let your health care provider know about any changes, no matter how small.  If you are 40  or older, have a clinician do a breast exam (clinical breast exam or CBE) every year. Depending on your age, family history, and medical history, it may be recommended that you also have a yearly breast X-ray (mammogram).  If you  have a family history of breast cancer, talk with your health care provider about genetic screening.  If you are at high risk for breast cancer, talk with your health care provider about having an MRI and a mammogram every year.  Breast cancer (BRCA) gene test is recommended for women who have family members with BRCA-related cancers. Results of the assessment will determine the need for genetic counseling and BRCA1 and for BRCA2 testing. BRCA-related cancers include these types: ? Breast. This occurs in males or females. ? Ovarian. ? Tubal. This may also be called fallopian tube cancer. ? Cancer of the abdominal or pelvic lining (peritoneal cancer). ? Prostate. ? Pancreatic.  Cervical, Uterine, and Ovarian Cancer Your health care provider may recommend that you be screened regularly for cancer of the pelvic organs. These include your ovaries, uterus, and vagina. This screening involves a pelvic exam, which includes checking for microscopic changes to the surface of your cervix (Pap test).  For women ages 21-65, health care providers may recommend a pelvic exam and a Pap test every three years. For women ages 79-65, they may recommend the Pap test and pelvic exam, combined with testing for human papilloma virus (HPV), every five years. Some types of HPV increase your risk of cervical cancer. Testing for HPV may also be done on women of any age who have unclear Pap test results.  Other health care providers may not recommend any screening for nonpregnant women who are considered low risk for pelvic cancer and have no symptoms. Ask your health care provider if a screening pelvic exam is right for you.  If you have had past treatment for cervical cancer or a condition that could lead to cancer, you need Pap tests and screening for cancer for at least 20 years after your treatment. If Pap tests have been discontinued for you, your risk factors (such as having a new sexual partner) need to be  reassessed to determine if you should start having screenings again. Some women have medical problems that increase the chance of getting cervical cancer. In these cases, your health care provider may recommend that you have screening and Pap tests more often.  If you have a family history of uterine cancer or ovarian cancer, talk with your health care provider about genetic screening.  If you have vaginal bleeding after reaching menopause, tell your health care provider.  There are currently no reliable tests available to screen for ovarian cancer.  Lung Cancer Lung cancer screening is recommended for adults 69-62 years old who are at high risk for lung cancer because of a history of smoking. A yearly low-dose CT scan of the lungs is recommended if you:  Currently smoke.  Have a history of at least 30 pack-years of smoking and you currently smoke or have quit within the past 15 years. A pack-year is smoking an average of one pack of cigarettes per day for one year.  Yearly screening should:  Continue until it has been 15 years since you quit.  Stop if you develop a health problem that would prevent you from having lung cancer treatment.  Colorectal Cancer  This type of cancer can be detected and can often be prevented.  Routine colorectal cancer screening usually begins at  age 42 and continues through age 45.  If you have risk factors for colon cancer, your health care provider may recommend that you be screened at an earlier age.  If you have a family history of colorectal cancer, talk with your health care provider about genetic screening.  Your health care provider may also recommend using home test kits to check for hidden blood in your stool.  A small camera at the end of a tube can be used to examine your colon directly (sigmoidoscopy or colonoscopy). This is done to check for the earliest forms of colorectal cancer.  Direct examination of the colon should be repeated every  5-10 years until age 71. However, if early forms of precancerous polyps or small growths are found or if you have a family history or genetic risk for colorectal cancer, you may need to be screened more often.  Skin Cancer  Check your skin from head to toe regularly.  Monitor any moles. Be sure to tell your health care provider: ? About any new moles or changes in moles, especially if there is a change in a mole's shape or color. ? If you have a mole that is larger than the size of a pencil eraser.  If any of your family members has a history of skin cancer, especially at a Steve Gregg age, talk with your health care provider about genetic screening.  Always use sunscreen. Apply sunscreen liberally and repeatedly throughout the day.  Whenever you are outside, protect yourself by wearing long sleeves, pants, a wide-brimmed hat, and sunglasses.  What should I know about osteoporosis? Osteoporosis is a condition in which bone destruction happens more quickly than new bone creation. After menopause, you may be at an increased risk for osteoporosis. To help prevent osteoporosis or the bone fractures that can happen because of osteoporosis, the following is recommended:  If you are 46-71 years old, get at least 1,000 mg of calcium and at least 600 mg of vitamin D per day.  If you are older than age 55 but younger than age 65, get at least 1,200 mg of calcium and at least 600 mg of vitamin D per day.  If you are older than age 54, get at least 1,200 mg of calcium and at least 800 mg of vitamin D per day.  Smoking and excessive alcohol intake increase the risk of osteoporosis. Eat foods that are rich in calcium and vitamin D, and do weight-bearing exercises several times each week as directed by your health care provider. What should I know about how menopause affects my mental health? Depression may occur at any age, but it is more common as you become older. Common symptoms of depression  include:  Low or sad mood.  Changes in sleep patterns.  Changes in appetite or eating patterns.  Feeling an overall lack of motivation or enjoyment of activities that you previously enjoyed.  Frequent crying spells.  Talk with your health care provider if you think that you are experiencing depression. What should I know about immunizations? It is important that you get and maintain your immunizations. These include:  Tetanus, diphtheria, and pertussis (Tdap) booster vaccine.  Influenza every year before the flu season begins.  Pneumonia vaccine.  Shingles vaccine.  Your health care provider may also recommend other immunizations. This information is not intended to replace advice given to you by your health care provider. Make sure you discuss any questions you have with your health care provider. Document Released: 03/02/2005  Document Revised: 07/29/2015 Document Reviewed: 10/12/2014 Elsevier Interactive Patient Education  2018 Elsevier Inc.  

## 2017-04-03 NOTE — Progress Notes (Signed)
lab

## 2017-04-03 NOTE — Progress Notes (Signed)
Catherine [name] 04-04-1945 [id number]    History:    Presents for annual exam. Postmenopausal on no HRT with no bleeding. 06/2014 atypical glandular cells with a negative D&C. 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  Normal mammog history. 2011 negative colonoscopy.  2018 DEXA T score -0.5 at hip  Showing some decline. Long-term chronic back pain/ spinal surgery 05/2016 with good relief of pain. Currently having ankle pain and may need a replacement.Marland Kitchen arthritis biggest problem.   Past medical history, past surgical history, family history and social history were all reviewed and documented in the EPIC chart.2 daughters, 2 stepsons all doing well.  ROS:  A ROS was performed and pertinent positives and negatives are included.  Exam:  Vitals:   04/03/17 0834  Weight: 108 lb (49 kg)  Height: '5\' 2"'$  (1.575 m)   Body mass index is 19.75 kg/m.   General appearance:  Normal Thyroid:  Symmetrical, normal in size, without palpable masses or nodularity. Respiratory  Auscultation:  Clear without wheezing or rhonchi Cardiovascular  Auscultation:  Regular rate, without rubs, murmurs or gallops  Edema/varicosities:  Not grossly evident Abdominal  Soft,nontender, without masses, guarding or rebound.  Liver/spleen:  No organomegaly noted  Hernia:  None appreciated  Skin  Inspection:  Grossly normal   Breasts: Examined lying and sitting.     Right: Without masses, retractions, discharge or axillary adenopathy.     Left: Without masses, retractions, discharge or axillary adenopathy. Gentitourinary   Inguinal/mons:  Normal without inguinal adenopathy  External genitalia:  Normal  BUS/Urethra/Skene's glands:  Normal  Vagina:  atrophic  Cervix:  Normal  Uterus:  normal in size, shape and contour.  Midline and mobile  Adnexa/parametria:     Rt: Without masses or tenderness.   Lt: Without masses or tenderness.  Anus and perineum: Normal  Digital rectal exam: Normal sphincter tone without palpated masses or  tenderness  Assessment/Plan:  72 y.o. M WF G4P2  + 2 stepsons for annual exam.    Postmenopausal/no bleeding/no HRT 06/2014 atypical glandular cells  with negative D&C Arthritis - orthopedic manages Labs-primary care  Plan: SBE's, continue annual screening mammogram, calcium rich diet, vitamin D 2000 daily encouraged. Regular exercise, home safety, fall prevention and importance of continuing tai chi/balance exercise reviewed. Continue vaginal lubricants with intercourse.Pap    [place], 8:37 AM 04/03/2017

## 2017-04-08 LAB — PAP IG W/ RFLX HPV ASCU

## 2017-04-12 DIAGNOSIS — J385 Laryngeal spasm: Secondary | ICD-10-CM | POA: Diagnosis not present

## 2017-04-17 DIAGNOSIS — R82998 Other abnormal findings in urine: Secondary | ICD-10-CM | POA: Diagnosis not present

## 2017-04-17 DIAGNOSIS — Z0000 Encounter for general adult medical examination without abnormal findings: Secondary | ICD-10-CM | POA: Diagnosis not present

## 2017-04-17 DIAGNOSIS — R7301 Impaired fasting glucose: Secondary | ICD-10-CM | POA: Diagnosis not present

## 2017-04-18 ENCOUNTER — Ambulatory Visit (INDEPENDENT_AMBULATORY_CARE_PROVIDER_SITE_OTHER): Payer: 59

## 2017-04-18 ENCOUNTER — Encounter (INDEPENDENT_AMBULATORY_CARE_PROVIDER_SITE_OTHER): Payer: Self-pay | Admitting: Orthopedic Surgery

## 2017-04-18 ENCOUNTER — Ambulatory Visit (INDEPENDENT_AMBULATORY_CARE_PROVIDER_SITE_OTHER): Payer: 59 | Admitting: Orthopedic Surgery

## 2017-04-18 VITALS — Ht 62.0 in | Wt 108.0 lb

## 2017-04-18 DIAGNOSIS — M722 Plantar fascial fibromatosis: Secondary | ICD-10-CM

## 2017-04-18 DIAGNOSIS — M19172 Post-traumatic osteoarthritis, left ankle and foot: Secondary | ICD-10-CM

## 2017-04-18 MED ORDER — LIDOCAINE HCL 1 % IJ SOLN
2.0000 mL | INTRAMUSCULAR | Status: AC | PRN
  Administered 2017-04-18: 2 mL

## 2017-04-18 MED ORDER — METHYLPREDNISOLONE ACETATE 40 MG/ML IJ SUSP
40.0000 mg | INTRAMUSCULAR | Status: AC | PRN
  Administered 2017-04-18: 40 mg

## 2017-04-18 NOTE — Progress Notes (Signed)
Office Visit Note   Patient: Catherine [name]           Date of Birth: [birth year]           MRN: [id number] Visit Date: 04/18/2017              Requested by: Marton Redwood, MD [address] [place], [place] [address] PCP: Marton Redwood, MD  Chief Complaint  Patient presents with  . Left Foot - Pain    S/p cortisone injection 02/2017      HPI: Patient is a 72 year old woman who is status post lumbar spine surgery after surgery she has been having increasing heel pain on the left she is concerned that this may either be radicular or recurrence of her plantar fasciitis.  She did have a steroid injection a month ago.  Assessment & Plan: Visit Diagnoses:  1. Plantar fascial fibromatosis   2. Plantar fasciitis, left   3. Post-traumatic osteoarthritis, left ankle and foot     Plan: Patient was given instructions for Achilles stretching recommended CBD lotion for the plantar fasciitis recommended the orthotics recommended [place] [address] running sneakers follow-up as needed.  Discussed that we could consider an injection for the ankle if it flares up.  Follow-Up Instructions: Return if symptoms worsen or fail to improve.   Ortho Exam  Patient is alert, oriented, no adenopathy, well-dressed, normal affect, normal respiratory effort. Examination patient has a good dorsalis pedis pulse she has good ankle and subtalar motion she has dorsiflexion about 20 degrees past neutral with her knee extended.  Lateral compression of the calcaneus is nontender the Achilles tendon is nontender to palpation the peroneal tendons are nontender to palpation she is point tender to palpation over the origin of the plantar fascia.  She has a negative straight leg raise no radicular symptoms.  Imaging: Xr Ankle 2 Views Left  Result Date: 04/18/2017 2 view radiographs of the left ankle shows joint space narrowing of the lateral joint line with bone-on-bone contact there are periarticular bony spurs there is a  Haglund's deformity of the calcaneus and bony spurs and loose bodies anteriorly and posteriorly of the tibiotalar joint.  No images are attached to the encounter.  Labs: Lab Results  Component Value Date   LABORGA Yeast 07/16/2014    @LABSALLVALUES (HGBA1)@  Body mass index is 19.75 kg/m.  Orders:  Orders Placed This Encounter  Procedures  . XR Ankle 2 Views Left   No orders of the defined types were placed in this encounter.    Procedures: Foot Inj Date/Time: 04/18/2017 4:12 PM Performed by: Newt Minion, MD Authorized by: Newt Minion, MD   Consent Given by:  Patient Site marked: the procedure site was marked   Timeout: prior to procedure the correct patient, procedure, and site was verified   Indications:  Fasciitis and pain Condition: Plantar Fasciitis   Location: left plantar fascia muscle   Prep: patient was prepped and draped in usual sterile fashion   Needle Size:  22 G Approach:  Dorsal Medications:  2 mL lidocaine 1 %; 40 mg methylPREDNISolone acetate 40 MG/ML Patient Tolerance:  Patient tolerated the procedure well with no immediate complications    Clinical Data: No additional findings.  ROS:  All other systems negative, except as noted in the HPI. Review of Systems  Objective: Vital Signs: Ht 5\' 2"  (1.575 m)   Wt 108 lb (49 kg)   BMI 19.75 kg/m   Specialty Comments:  No specialty comments available.  PMFS History: Patient Active Problem List   Diagnosis Date Noted  . Lumbar pseudoarthrosis 01/23/2017  . Abnormal ECG 12/24/2014  . Spasmodic dysphonia   . Difficult intubation   . Insomnia   . Abnormal glandular Papanicolaou smear of cervix 11/04/2014  . Spondylolysis, lumbosacral 08/13/2014  . Herniated nucleus pulposus, L5-S1 08/10/2014  . Arthritis 04/18/2012  . CIN I (cervical intraepithelial neoplasia I)   . Fever blister    Past Medical History:  Diagnosis Date  . Abnormal glandular Papanicolaou smear of cervix 11/04/2014    . Arthritis   . Back pain   . CIN I (cervical intraepithelial neoplasia I)    LEEP 2006 margins free      negative HR HPV 2008   . Difficult intubation    needs pediatric equipment  . Fever blister   . Herniated nucleus pulposus, L5-S1 08/10/2014  . Insomnia   . Neck pain   . Pseudoarthrosis of lumbar spine   . Spasmodic dysphonia   . 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 Hypertension Mother   . Dementia Mother   . Hypertension Father   . Diabetes Father   . Heart disease Father   . Heart failure Father   . Breast cancer Neg Hx     Past Surgical History:  Procedure Laterality Date  . ABDOMINAL EXPOSURE N/A 01/23/2017   Procedure: ABDOMINAL EXPOSURE;  Surgeon: Rosetta Posner, MD;  Location: [facility] OR;  Service: Vascular;  Laterality: N/A;  . ANTERIOR LUMBAR FUSION N/A 01/23/2017   Procedure: Revision of Lumbar five-Sacral One Fusion with Anterior lumbar interbody fusion, Dr. Sherren Mocha Early to co surgeon;  Surgeon: Kristeen Miss, MD;  Location: [facility];  Service: Neurosurgery;  Laterality: N/A;  . BACK SURGERY     Fusion  . CATARACT EXTRACTION W/ INTRAOCULAR LENS  IMPLANT, BILATERAL    . CERVICAL BIOPSY  W/ LOOP ELECTRODE EXCISION  2006  . CERVICAL CONIZATION W/BX N/A 12/28/2014   Procedure: CONIZATION CERVIX WITH BIOPSY;  Surgeon: Terrance Mass, MD;  Location: [facility];  Service: Gynecology;  Laterality: N/A;  . COLONOSCOPY    . COLPOSCOPY    . DILATION AND CURETTAGE OF UTERUS    . HYSTEROSCOPY W/D&C N/A 12/28/2014   Procedure: DILATATION AND CURETTAGE /HYSTEROSCOPY Diagnostic Hysteroscopy;  Surgeon: Terrance Mass, MD;  Location: [facility];  Service: Gynecology;  Laterality: N/A;  . JOINT REPLACEMENT     Left knee  . KNEE SURGERY     Rt-95,Lft.-98,Replacement-09  . LUMBAR LAMINECTOMY/DECOMPRESSION MICRODISCECTOMY Bilateral 08/10/2014   Procedure: Bilateral Lumbar five-Sacral one Diskectomy;  Surgeon: Kristeen Miss, MD;  Location: [facility];  Service: Neurosurgery;  Laterality: Bilateral;  Bilateral L5-S1 Diskectomy  . ROTATOR CUFF REPAIR  2004  . TONSILLECTOMY    . TUBAL LIGATION     Social History   Occupational History  . Not on file  Tobacco Use  . Smoking status: Never Smoker  . Smokeless tobacco: Never Used  Substance and Sexual Activity  . Alcohol use: Yes    Alcohol/week: 6.0 oz    Types: 10 Standard drinks or equivalent per week    Comment: daily drink  . Drug use: No  . Sexual activity: Yes    Birth control/protection: Post-menopausal, Surgical    Comment: INSURANCE QUESTIONS DECLINED

## 2017-04-24 DIAGNOSIS — Z1389 Encounter for screening for other disorder: Secondary | ICD-10-CM | POA: Diagnosis not present

## 2017-04-24 DIAGNOSIS — R490 Dysphonia: Secondary | ICD-10-CM | POA: Diagnosis not present

## 2017-04-24 DIAGNOSIS — M5416 Radiculopathy, lumbar region: Secondary | ICD-10-CM | POA: Diagnosis not present

## 2017-04-24 DIAGNOSIS — Z0000 Encounter for general adult medical examination without abnormal findings: Secondary | ICD-10-CM | POA: Diagnosis not present

## 2017-04-24 DIAGNOSIS — M722 Plantar fascial fibromatosis: Secondary | ICD-10-CM | POA: Diagnosis not present

## 2017-04-24 DIAGNOSIS — G4700 Insomnia, unspecified: Secondary | ICD-10-CM | POA: Diagnosis not present

## 2017-04-24 DIAGNOSIS — R7301 Impaired fasting glucose: Secondary | ICD-10-CM | POA: Diagnosis not present

## 2017-04-24 DIAGNOSIS — M1711 Unilateral primary osteoarthritis, right knee: Secondary | ICD-10-CM | POA: Diagnosis not present

## 2017-04-24 DIAGNOSIS — M159 Polyosteoarthritis, unspecified: Secondary | ICD-10-CM | POA: Diagnosis not present

## 2017-04-24 DIAGNOSIS — Z6820 Body mass index (BMI) 20.0-20.9, adult: Secondary | ICD-10-CM | POA: Diagnosis not present

## 2017-04-25 MED FILL — diazePAM 5 MG TABS: 5 | 10 days supply | Qty: 40 | Fill #0

## 2017-05-01 DIAGNOSIS — Z1389 Encounter for screening for other disorder: Secondary | ICD-10-CM | POA: Diagnosis not present

## 2017-05-01 DIAGNOSIS — M1711 Unilateral primary osteoarthritis, right knee: Secondary | ICD-10-CM | POA: Diagnosis not present

## 2017-05-02 MED FILL — CELECOXIB 200 MG CAP: 200 | 90 days supply | Qty: 90 | Fill #0

## 2017-05-08 DIAGNOSIS — M1711 Unilateral primary osteoarthritis, right knee: Secondary | ICD-10-CM | POA: Diagnosis not present

## 2017-05-22 MED FILL — valACYclovir HCL 1 GM TABS: 1 | 4 days supply | Qty: 12 | Fill #0

## 2017-05-27 MED FILL — DOXYCYCLINE HYCLATE 100 MG: 100 | 10 days supply | Qty: 20 | Fill #0

## 2017-06-10 ENCOUNTER — Encounter: Payer: 59 | Admitting: Women's Health

## 2017-06-24 MED FILL — diazePAM 5 MG TABS: 5 | 10 days supply | Qty: 40 | Fill #0

## 2017-07-01 MED FILL — valACYclovir HCL 1 GM TABS: 1 | 4 days supply | Qty: 12 | Fill #1

## 2017-07-05 DIAGNOSIS — J385 Laryngeal spasm: Secondary | ICD-10-CM | POA: Diagnosis not present

## 2017-07-11 DIAGNOSIS — H26493 Other secondary cataract, bilateral: Secondary | ICD-10-CM | POA: Diagnosis not present

## 2017-07-11 DIAGNOSIS — H04123 Dry eye syndrome of bilateral lacrimal glands: Secondary | ICD-10-CM | POA: Diagnosis not present

## 2017-07-11 DIAGNOSIS — H524 Presbyopia: Secondary | ICD-10-CM | POA: Diagnosis not present

## 2017-08-07 DIAGNOSIS — S32009K Unspecified fracture of unspecified lumbar vertebra, subsequent encounter for fracture with nonunion: Secondary | ICD-10-CM | POA: Diagnosis not present

## 2017-08-07 DIAGNOSIS — Z981 Arthrodesis status: Secondary | ICD-10-CM | POA: Diagnosis not present

## 2017-08-07 DIAGNOSIS — I10 Essential (primary) hypertension: Secondary | ICD-10-CM | POA: Diagnosis not present

## 2017-08-07 MED FILL — HYDROCODON-APAP 5-325: 5-325 | 20 days supply | Qty: 60 | Fill #0

## 2017-08-07 MED FILL — BACLOFEN 20 MG TABLET: 20 | 13 days supply | Qty: 40 | Fill #0

## 2017-08-07 MED FILL — CELECOXIB 200 MG CAP: 200 | 90 days supply | Qty: 90 | Fill #1

## 2017-09-05 MED FILL — diazePAM 2 MG TABS: 2 | 20 days supply | Qty: 40 | Fill #0

## 2017-10-14 MED FILL — diazePAM 5 MG TABS: 5 | 10 days supply | Qty: 40 | Fill #0

## 2017-10-18 DIAGNOSIS — J385 Laryngeal spasm: Secondary | ICD-10-CM | POA: Diagnosis not present

## 2017-11-08 MED FILL — HYDROCODON-APAP 5-325: 5-325 | 20 days supply | Qty: 60 | Fill #0

## 2017-11-13 MED FILL — CELECOXIB 200 MG CAP: 200 | 90 days supply | Qty: 90 | Fill #2

## 2017-11-14 DIAGNOSIS — M1711 Unilateral primary osteoarthritis, right knee: Secondary | ICD-10-CM | POA: Diagnosis not present

## 2017-12-09 MED FILL — diazePAM 5 MG TABS: 5 | 10 days supply | Qty: 40 | Fill #0

## 2017-12-16 DIAGNOSIS — Z981 Arthrodesis status: Secondary | ICD-10-CM | POA: Diagnosis not present

## 2017-12-25 DIAGNOSIS — M1711 Unilateral primary osteoarthritis, right knee: Secondary | ICD-10-CM | POA: Diagnosis not present

## 2018-01-01 DIAGNOSIS — M1711 Unilateral primary osteoarthritis, right knee: Secondary | ICD-10-CM | POA: Diagnosis not present

## 2018-01-08 DIAGNOSIS — M1711 Unilateral primary osteoarthritis, right knee: Secondary | ICD-10-CM | POA: Diagnosis not present

## 2018-01-23 MED FILL — diazePAM 2 MG TABS: 2 | 20 days supply | Qty: 40 | Fill #0

## 2018-02-06 ENCOUNTER — Other Ambulatory Visit: Payer: Self-pay | Admitting: Internal Medicine

## 2018-02-06 DIAGNOSIS — Z1231 Encounter for screening mammogram for malignant neoplasm of breast: Secondary | ICD-10-CM

## 2018-02-07 DIAGNOSIS — J385 Laryngeal spasm: Secondary | ICD-10-CM | POA: Diagnosis not present

## 2018-02-11 MED FILL — CELECOXIB 200 MG CAP: 200 | 90 days supply | Qty: 90 | Fill #3

## 2018-02-12 MED FILL — HYDROCODON-APAP 5-325: 5-325 | 20 days supply | Qty: 60 | Fill #0

## 2018-02-13 DIAGNOSIS — D2271 Melanocytic nevi of right lower limb, including hip: Secondary | ICD-10-CM | POA: Diagnosis not present

## 2018-02-13 DIAGNOSIS — D0359 Melanoma in situ of other part of trunk: Secondary | ICD-10-CM | POA: Diagnosis not present

## 2018-02-13 DIAGNOSIS — L814 Other melanin hyperpigmentation: Secondary | ICD-10-CM | POA: Diagnosis not present

## 2018-02-13 DIAGNOSIS — Z85828 Personal history of other malignant neoplasm of skin: Secondary | ICD-10-CM | POA: Diagnosis not present

## 2018-02-13 DIAGNOSIS — L570 Actinic keratosis: Secondary | ICD-10-CM | POA: Diagnosis not present

## 2018-02-13 DIAGNOSIS — D485 Neoplasm of uncertain behavior of skin: Secondary | ICD-10-CM | POA: Diagnosis not present

## 2018-02-13 DIAGNOSIS — L821 Other seborrheic keratosis: Secondary | ICD-10-CM | POA: Diagnosis not present

## 2018-02-13 DIAGNOSIS — D225 Melanocytic nevi of trunk: Secondary | ICD-10-CM | POA: Diagnosis not present

## 2018-02-17 DIAGNOSIS — M545 Low back pain: Secondary | ICD-10-CM | POA: Diagnosis not present

## 2018-02-18 DIAGNOSIS — M461 Sacroiliitis, not elsewhere classified: Secondary | ICD-10-CM | POA: Diagnosis not present

## 2018-02-18 DIAGNOSIS — T8484XA Pain due to internal orthopedic prosthetic devices, implants and grafts, initial encounter: Secondary | ICD-10-CM | POA: Diagnosis not present

## 2018-02-18 DIAGNOSIS — Z981 Arthrodesis status: Secondary | ICD-10-CM | POA: Diagnosis not present

## 2018-03-10 MED FILL — diazePAM 2 MG TABS: 2 | 20 days supply | Qty: 40 | Fill #0

## 2018-03-12 ENCOUNTER — Ambulatory Visit
Admission: RE | Admit: 2018-03-12 | Discharge: 2018-03-12 | Disposition: A | Discharge status: Home / Self Care | Payer: PPO | Source: Ambulatory Visit | Attending: Internal Medicine | Admitting: Internal Medicine

## 2018-03-12 DIAGNOSIS — Z1231 Encounter for screening mammogram for malignant neoplasm of breast: Secondary | ICD-10-CM | POA: Diagnosis not present

## 2018-03-13 DIAGNOSIS — Z85828 Personal history of other malignant neoplasm of skin: Secondary | ICD-10-CM | POA: Diagnosis not present

## 2018-03-13 DIAGNOSIS — D0359 Melanoma in situ of other part of trunk: Secondary | ICD-10-CM | POA: Diagnosis not present

## 2018-03-13 MED FILL — MUPIROCIN 2% OINTMENT: 2 | 7 days supply | Qty: 22 | Fill #0

## 2018-04-02 MED FILL — predniSONE 10 MG TABS: 10 | 6 days supply | Qty: 21 | Fill #0

## 2018-04-02 MED FILL — BENZONATATE 100 MG CAPS: 100 | 30 days supply | Qty: 90 | Fill #0

## 2018-04-07 MED FILL — CEFDINIR 300 MG CAPSULE: 300 | 7 days supply | Qty: 14 | Fill #0

## 2018-04-14 ENCOUNTER — Ambulatory Visit: Payer: PPO | Admitting: Women's Health

## 2018-04-14 ENCOUNTER — Encounter: Payer: Self-pay | Admitting: Women's Health

## 2018-04-14 ENCOUNTER — Other Ambulatory Visit: Payer: Self-pay

## 2018-04-14 VITALS — BP 110/80

## 2018-04-14 DIAGNOSIS — R87629 Unspecified abnormal cytological findings in specimens from vagina: Secondary | ICD-10-CM

## 2018-04-14 NOTE — Progress Notes (Signed)
Catherine [name] [birth year] [id number]    History:    Presents for Pap.  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  2016 atypical glandular cells with a negative D&C, normal Paps after.  2011- colonoscopy.  2018 T score -0.5 at hip.  Normal mammogram history.  Current on vaccines has had Shingrix as well as Zostavax.  Continues to have problems with arthritis and back pain.  Has had joint replacements. Active lifestyle with exercise.  History of restrictive airway causing loss of voice, currently being treated by primary care for bronchitis but is much better now.  No fever.  Recently diagnosed with melanoma on chest has follow-up scheduled.  Past medical history, past surgical history, family history and social history were all reviewed and documented in the EPIC chart.  Retired Pharmacist, hospital.  2 daughters 36 year old daughter gay currently trying donor insemination.  2 stepsons.  ROS:  A ROS was performed and pertinent positives and negatives are included.  Exam:  Vitals:   04/14/18 1209  BP: 110/80   There is no height or weight on file to calculate BMI.   General appearance:  Normal Thyroid:  Symmetrical, normal in size, without palpable masses or nodularity. Respiratory  Auscultation:  Clear without wheezing or rhonchi Cardiovascular  Auscultation:  Regular rate, without rubs, murmurs or gallops  Edema/varicosities:  Not grossly evident Abdominal  Soft,nontender, without masses, guarding or rebound.  Liver/spleen:  No organomegaly noted  Hernia:  None appreciated  Skin  Inspection:  Grossly normal    Gentitourinary   Inguinal/mons:  Normal without inguinal adenopathy  External genitalia:  Normal  BUS/Urethra/Skene's glands:  Normal  Vagina: Atrophic cervix:  Normal stenotic  Uterus:   normal in size, shape and contour.  Midline and mobile  Adnexa/parametria:     Rt: Without masses or tenderness.   Lt: Without masses or tenderness.  Anus and perineum: Normal  Digital rectal exam: Normal sphincter  tone without palpated masses or tenderness  Assessment/Plan:  73 y.o. MWF G4, P2 +2 step for Pap.  Postmenopausal/no HRT/no bleeding/vaginal atrophy 2016 atypical glandular cells with negative D&C Arthritis/chronic back pain Primary care manages labs   Plan: Pap, continue healthy lifestyle of regular exercise, Pilates and vitamin D supplement.  Home safety, fall prevention discussed.  SBEs, continue annual mammogram.  Encouraged over-the-counter lubricants as needed.    [place], 1:00 PM 04/14/2018

## 2018-04-14 NOTE — Addendum Note (Signed)
Addended by: Lorine Bears on: 04/14/2018 02:08 PM   Modules accepted: Orders

## 2018-04-14 NOTE — Patient Instructions (Signed)
Health Maintenance After Age 73 After age 73, you are at a higher risk for certain long-term diseases and infections as well as injuries from falls. Falls are a major cause of broken bones and head injuries in people who are older than age 73. Getting regular preventive care can help to keep you healthy and well. Preventive care includes getting regular testing and making lifestyle changes as recommended by your health care provider. Talk with your health care provider about:  Which screenings and tests you should have. A screening is a test that checks for a disease when you have no symptoms.  A diet and exercise plan that is right for you. What should I know about screenings and tests to prevent falls? Screening and testing are the best ways to find a health problem early. Early diagnosis and treatment give you the best chance of managing medical conditions that are common after age 73. Certain conditions and lifestyle choices may make you more likely to have a fall. Your health care provider may recommend:  Regular vision checks. Poor vision and conditions such as cataracts can make you more likely to have a fall. If you wear glasses, make sure to get your prescription updated if your vision changes.  Medicine review. Work with your health care provider to regularly review all of the medicines you are taking, including over-the-counter medicines. Ask your health care provider about any side effects that may make you more likely to have a fall. Tell your health care provider if any medicines that you take make you feel dizzy or sleepy.  Osteoporosis screening. Osteoporosis is a condition that causes the bones to get weaker. This can make the bones weak and cause them to break more easily.  Blood pressure screening. Blood pressure changes and medicines to control blood pressure can make you feel dizzy.  Strength and balance checks. Your health care provider may recommend certain tests to check your  strength and balance while standing, walking, or changing positions.  Foot health exam. Foot pain and numbness, as well as not wearing proper footwear, can make you more likely to have a fall.  Depression screening. You may be more likely to have a fall if you have a fear of falling, feel emotionally low, or feel unable to do activities that you used to do.  Alcohol use screening. Using too much alcohol can affect your balance and may make you more likely to have a fall. What actions can I take to lower my risk of falls? General instructions  Talk with your health care provider about your risks for falling. Tell your health care provider if: ? You fall. Be sure to tell your health care provider about all falls, even ones that seem minor. ? You feel dizzy, sleepy, or off-balance.  Take over-the-counter and prescription medicines only as told by your health care provider. These include any supplements.  Eat a healthy diet and maintain a healthy weight. A healthy diet includes low-fat dairy products, low-fat (lean) meats, and fiber from whole grains, beans, and lots of fruits and vegetables. Home safety  Remove any tripping hazards, such as rugs, cords, and clutter.  Install safety equipment such as grab bars in bathrooms and safety rails on stairs.  Keep rooms and walkways well-lit. Activity   Follow a regular exercise program to stay fit. This will help you maintain your balance. Ask your health care provider what types of exercise are appropriate for you.  If you need a cane or   walker, use it as recommended by your health care provider.  Wear supportive shoes that have nonskid soles. Lifestyle  Do not drink alcohol if your health care provider tells you not to drink.  If you drink alcohol, limit how much you have: ? 0-1 drink a day for women. ? 0-2 drinks a day for men.  Be aware of how much alcohol is in your drink. In the [place] one drink equals one typical bottle of beer (12  oz), one-half glass of wine (5 oz), or one shot of hard liquor (1 oz).  Do not use any products that contain nicotine or tobacco, such as cigarettes and e-cigarettes. If you need help quitting, ask your health care provider. Summary  Having a healthy lifestyle and getting preventive care can help to protect your health and wellness after age 73.  Screening and testing are the best way to find a health problem early and help you avoid having a fall. Early diagnosis and treatment give you the best chance for managing medical conditions that are more common for people who are older than age 73.  Falls are a major cause of broken bones and head injuries in people who are older than age 73. Take precautions to prevent a fall at home.  Work with your health care provider to learn what changes you can make to improve your health and wellness and to prevent falls. This information is not intended to replace advice given to you by your health care provider. Make sure you discuss any questions you have with your health care provider. Document Released: 11/21/2016 Document Revised: 11/21/2016 Document Reviewed: 11/21/2016 Elsevier Interactive Patient Education  2019 Elsevier Inc.  

## 2018-04-15 LAB — PAP IG W/ RFLX HPV ASCU

## 2018-04-21 MED FILL — HYDROCODON-APAP 5-325: 5-325 | 20 days supply | Qty: 60 | Fill #0

## 2018-04-30 DIAGNOSIS — R7989 Other specified abnormal findings of blood chemistry: Secondary | ICD-10-CM | POA: Diagnosis not present

## 2018-04-30 DIAGNOSIS — Z0000 Encounter for general adult medical examination without abnormal findings: Secondary | ICD-10-CM | POA: Diagnosis not present

## 2018-04-30 DIAGNOSIS — R7301 Impaired fasting glucose: Secondary | ICD-10-CM | POA: Diagnosis not present

## 2018-05-01 DIAGNOSIS — R82998 Other abnormal findings in urine: Secondary | ICD-10-CM | POA: Diagnosis not present

## 2018-05-08 DIAGNOSIS — R490 Dysphonia: Secondary | ICD-10-CM | POA: Diagnosis not present

## 2018-05-08 DIAGNOSIS — R1013 Epigastric pain: Secondary | ICD-10-CM | POA: Diagnosis not present

## 2018-05-08 DIAGNOSIS — M159 Polyosteoarthritis, unspecified: Secondary | ICD-10-CM | POA: Diagnosis not present

## 2018-05-08 DIAGNOSIS — Z0000 Encounter for general adult medical examination without abnormal findings: Secondary | ICD-10-CM | POA: Diagnosis not present

## 2018-05-08 DIAGNOSIS — M48061 Spinal stenosis, lumbar region without neurogenic claudication: Secondary | ICD-10-CM | POA: Diagnosis not present

## 2018-05-08 DIAGNOSIS — Z1331 Encounter for screening for depression: Secondary | ICD-10-CM | POA: Diagnosis not present

## 2018-05-08 DIAGNOSIS — M25572 Pain in left ankle and joints of left foot: Secondary | ICD-10-CM | POA: Diagnosis not present

## 2018-05-08 DIAGNOSIS — G4700 Insomnia, unspecified: Secondary | ICD-10-CM | POA: Diagnosis not present

## 2018-05-20 MED FILL — diazePAM 2 MG TABS: 2 | 20 days supply | Qty: 40 | Fill #0

## 2018-05-22 ENCOUNTER — Other Ambulatory Visit (HOSPITAL_COMMUNITY): Payer: Self-pay | Admitting: Neurological Surgery

## 2018-05-22 DIAGNOSIS — M545 Low back pain, unspecified: Secondary | ICD-10-CM

## 2018-05-26 ENCOUNTER — Ambulatory Visit (HOSPITAL_COMMUNITY)
Admission: RE | Admit: 2018-05-26 | Discharge: 2018-05-26 | Disposition: A | Discharge status: Home / Self Care | Payer: PPO | Source: Ambulatory Visit | Attending: Neurological Surgery | Admitting: Neurological Surgery

## 2018-05-26 ENCOUNTER — Other Ambulatory Visit: Payer: Self-pay

## 2018-05-26 DIAGNOSIS — M545 Low back pain, unspecified: Secondary | ICD-10-CM

## 2018-05-26 DIAGNOSIS — Z981 Arthrodesis status: Secondary | ICD-10-CM | POA: Diagnosis not present

## 2018-05-26 DIAGNOSIS — M5136 Other intervertebral disc degeneration, lumbar region: Secondary | ICD-10-CM | POA: Diagnosis not present

## 2018-05-26 DIAGNOSIS — M549 Dorsalgia, unspecified: Secondary | ICD-10-CM | POA: Diagnosis not present

## 2018-06-06 DIAGNOSIS — Z981 Arthrodesis status: Secondary | ICD-10-CM | POA: Diagnosis not present

## 2018-06-06 DIAGNOSIS — M5136 Other intervertebral disc degeneration, lumbar region: Secondary | ICD-10-CM | POA: Diagnosis not present

## 2018-06-09 ENCOUNTER — Other Ambulatory Visit: Payer: Self-pay | Admitting: Neurological Surgery

## 2018-06-11 NOTE — Progress Notes (Signed)
Anesthesia Note: PAT is scheduled for 06/13/18  Case:  [id number] Date/Time:  06/17/18 1215   Procedure:  Removal of bilateral iliac fixation (N/A ) - Removal of bilateral iliac fixation   Anesthesia type:  General   Pre-op diagnosis:  History of lumbar spinal fusion   Location:  [facility] OR ROOM 18 / [facility]   Surgeon:  Kristeen Miss, MD     DISCUSSION: Patient is a 73 year old female scheduled for the above procedure.  History includes never smoker, DIFFICULT INTUBATION, spasmodic dysphonia (last laryngeal Botulinum toxin injection 02/07/18), back surgeries (L3-5 laminectomy, posterolateral arthrodesis 06/09/09; L5-S1 laminotomy and microdiscectomy 08/10/14; L5 laminectomy with removal of pars defects and complete facetectomies L5-S1 posterior and posterior lateral arthrodesis 08/13/14; revision of pseudoarthrosis L5-S1 via anterior interbody arthrodesis, s/p decompression L2-L3 via laminectomy posterior lumbar interbody arthrodesis, removal of hardware L3-L5 revision of posterior fixation at L5-S1 with fixation from L2 to the ilium. Posterior lateral arthrodesis with infuse and allograft L2-3 and L5-S1 01/23/17).  She requests Suzette Battiest, MD and Judeth Cornfield, CRNA as her anesthesia team if possible. I will communicate to anesthesia directors.  Regarding DIFFICULT INTUBATION history: She reported need for "pediatric" ETT (due to spasmodic dysphonia). DATE ANESTHESIA RECORDS  01/23/17 Difficulty was anticipated; Grade 1; Self; Endotracheal Tube; MAC, 3; Oral; 6 mm; Cuffed, Min.occ.pres., Air; 1; Stage manager, ETCO2 (Capnography), Bilateral Breath Sounds, Chest Rise; 22 cm  08/13/14 Intubation Type: IV induction Ventilation: Mask ventilation without difficulty Laryngoscope Size: Mac and 3 Grade View: Grade I Tube type: Oral Tube size: 6.0 mm Number of attempts: 1 Airway Equipment and Method: Stylet Comments: Easy atraumatic induction and intubation with MAC 3 blade.  Dr. Oletta Lamas  verified placement before and after positioning prone.   08/10/14 Intubation Type: IV induction Ventilation: Mask ventilation without difficulty Grade View: Grade I Tube type: Oral Tube size: 6.0 mm Number of attempts: 1 Airway Equipment and Method: Stylet and LTA kit utilized  06/09/09 Anesthesia consult 06/03/09 regarding history of spasmodic dysphonia. Plan for smaller ETT given vocal cord issues. On 06/09/09, Miller 2 used to place 6.5 ETT. Grade I view.    Preoperative labs excepting for surgery, although COVID-19 test is still pending. If negative and no acute changes then I would anticipate that she can proceed as planned.   VS: BP 122/71   Pulse 82   Temp 36.6 C   Resp 18   Ht _0  (1.575 m)   Wt 50.6 kg   SpO2 100%   BMI 20.41 kg/m    PROVIDERS: Marton Redwood, MD is PCP ([place]). Carol Ada, MD is ENT. Last laryngeal Botox injection 02/07/18.  - She saw cardiologist Grayland Jack, MD on 12/24/14 prior to a GYN surgery due to poor r wave progression on EKG, felt likely related to lead placement. Repeat EKG was normal. No cardiac testing and PRN cardiology follow-up recommended.   LABS: Labs reviewed: Acceptable for surgery. (all labs ordered are listed, but only abnormal results are displayed)  Labs Reviewed  CBC - Abnormal; Notable for the following components:      Result Value   RDW 11.3 (*)    All other components within normal limits  SURGICAL PCR SCREEN  BASIC METABOLIC PANEL  TYPE AND SCREEN    IMAGES: CT L-spine/pelvis 05/26/18: IMPRESSION: 1. Solid L2 through S1 arthrodesis. 2. 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 Calcified disc material extends into both paravertebral and foraminal regions, with RIGHT greater than LEFT L1 neural impingement.   EKG:  N/A   CV: N/A   Past Medical History:  Diagnosis Date  . Abnormal glandular Papanicolaou smear of cervix 11/04/2014  . Arthritis   . Back pain   . CIN I (cervical intraepithelial neoplasia I)    LEEP 2006 margins free      negative HR HPV 2008   . Difficult intubation    needs pediatric equipment  . Fever blister   . Herniated nucleus pulposus, L5-S1 08/10/2014  . Insomnia   . Neck pain   . Pseudoarthrosis of lumbar spine   . Spasmodic dysphonia   . 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 ABDOMINAL EXPOSURE N/A 01/23/2017   Procedure: ABDOMINAL EXPOSURE;  Surgeon: Rosetta Posner, MD;  Location: [facility] OR;  Service: Vascular;  Laterality: N/A;  . ANTERIOR LUMBAR FUSION N/A 01/23/2017   Procedure: Revision of Lumbar five-Sacral One Fusion with Anterior lumbar interbody fusion, Dr. Sherren Mocha Early to co surgeon;  Surgeon: Kristeen Miss, MD;  Location: [facility];  Service: Neurosurgery;  Laterality: N/A;  . BACK SURGERY     Fusion  . CATARACT EXTRACTION W/ INTRAOCULAR LENS  IMPLANT, BILATERAL    . CERVICAL BIOPSY  W/ LOOP ELECTRODE EXCISION  2006  . CERVICAL CONIZATION W/BX N/A 12/28/2014   Procedure: CONIZATION CERVIX WITH BIOPSY;  Surgeon: Terrance Mass, MD;  Location: [facility];  Service: Gynecology;  Laterality: N/A;  . COLONOSCOPY    . COLPOSCOPY    . DILATION AND CURETTAGE OF UTERUS    . HYSTEROSCOPY W/D&C N/A 12/28/2014   Procedure: DILATATION AND CURETTAGE /HYSTEROSCOPY Diagnostic Hysteroscopy;  Surgeon: Terrance Mass, MD;  Location: [facility];  Service: Gynecology;  Laterality: N/A;  . JOINT REPLACEMENT     Left knee  . KNEE SURGERY     Rt-95,Lft.-98,Replacement-09  . LUMBAR LAMINECTOMY/DECOMPRESSION MICRODISCECTOMY Bilateral 08/10/2014   Procedure: Bilateral Lumbar five-Sacral one Diskectomy;  Surgeon: Kristeen Miss, MD;  Location: [facility];  Service: Neurosurgery;  Laterality: Bilateral;  Bilateral L5-S1  Diskectomy  . ROTATOR CUFF REPAIR  2004  . TONSILLECTOMY    . TUBAL LIGATION      MEDICATIONS: . celecoxib (CELEBREX) 200 MG capsule  . Cholecalciferol (VITAMIN D3) 125 MCG (5000 UT) CAPS  . dexamethasone (DECADRON) 2 MG tablet  . diazepam (VALIUM) 2 MG tablet  . HYDROcodone-acetaminophen (NORCO/VICODIN) 5-325 MG tablet  . loratadine (CLARITIN) 10 MG tablet  . Polyethyl Glycol-Propyl Glycol (SYSTANE OP)  . Probiotic Product (ALIGN) 4 MG CAPS  . vitamin B-12 (CYANOCOBALAMIN) 1000 MCG tablet  . zolpidem (AMBIEN) 10 MG tablet   No current facility-administered medications for this encounter.   She is no longer taking Decadron.   Myra Gianotti, PA-C Surgical Short Stay/Anesthesiology [facility] Phone [phone] [facility] Phone [phone] 06/13/2018 9:35 AM

## 2018-06-11 NOTE — Pre-Procedure Instructions (Signed)
Catherine [name]  06/11/2018      [place], [place] - [address] [place] [address] Phone: [phone] Fax: [phone]  [facility] [phone] - [place], [place] [address] [place] [address] Phone: [phone] Fax: [phone]    Your procedure is scheduled on Jun 17, 2018.  Report to [facility] Entrance "A" at 10:30 A.M.  Call this number if you have problems the morning of surgery:  [phone]   Remember:  Do not eat or drink after midnight.     Take these medicines the morning of surgery with A SIP OF WATER   Diazepam (Valium)  Hydrocodone-Acetaminophen  Eye Drops   7 days prior to surgery STOP taking any Aspirin (unless otherwise instructed by your surgeon), Aleve, Naproxen, Ibuprofen, Motrin, Advil, Goody's, BC's, all herbal medications, fish oil, and all vitamins.    Do not wear jewelry, make-up or nail polish.  Do not wear lotions, powders, or perfumes, or deodorant.  Do not shave 48 hours prior to surgery.    Do not bring valuables to the hospital.  [facility] is not responsible for any belongings or valuables.  [facility]- Preparing For Surgery  Before surgery, you can play an important role. Because skin is not sterile, your skin needs to be as free of germs as possible. You can reduce the number of germs on your skin by washing with CHG (chlorahexidine gluconate) Soap before surgery.  CHG is an antiseptic cleaner which kills germs and bonds with the skin to continue killing germs even after washing.    Oral Hygiene is also important to reduce your risk of infection.  Remember - BRUSH YOUR TEETH THE MORNING OF SURGERY WITH YOUR REGULAR TOOTHPASTE  Please do not use if you have an allergy to CHG or antibacterial soaps. If your skin becomes reddened/irritated stop using the CHG.  Do not shave (including legs and underarms) for at  least 48 hours prior to first CHG shower. It is OK to shave your face.  Please follow these instructions carefully.   1. Shower the NIGHT BEFORE SURGERY and the MORNING OF SURGERY with CHG.   2. If you chose to wash your hair, wash your hair first as usual with your normal shampoo.  3. After you shampoo, rinse your hair and body thoroughly to remove the shampoo.  4. Use CHG as you would any other liquid soap. You can apply CHG directly to the skin and wash gently with a scrungie or a clean washcloth.   5. Apply the CHG Soap to your body ONLY FROM THE NECK DOWN.  Do not use on open wounds or open sores. Avoid contact with your eyes, ears, mouth and genitals (private parts). Wash Face and genitals (private parts)  with your normal soap.  6. Wash thoroughly, paying special attention to the area where your surgery will be performed.  7. Thoroughly rinse your body with warm water from the neck down.  8. DO NOT shower/wash with your normal soap after using and rinsing off the CHG Soap.  9. Pat yourself dry with a CLEAN TOWEL.  10. Wear CLEAN PAJAMAS to bed the night before surgery, wear comfortable clothes the morning of surgery  11. Place CLEAN SHEETS on your bed the night of your first shower and DO NOT SLEEP WITH PETS.   Day of Surgery:  Do not apply any deodorants/lotions.  Please wear clean clothes to the hospital/surgery center.   Remember to brush your teeth WITH YOUR REGULAR TOOTHPASTE.   Contacts, dentures or bridgework may not be worn into surgery.  Leave your suitcase in the car.  After surgery it may be brought to your room.  For patients admitted to the hospital, discharge time will be determined by your treatment team.  Patients discharged the day of surgery will not be allowed to drive home.   Please read over the following fact sheets that you were given. Coughing and Deep Breathing and Surgical Site Infection Prevention

## 2018-06-12 ENCOUNTER — Inpatient Hospital Stay (HOSPITAL_COMMUNITY): Admission: RE | Admit: 2018-06-12 | Payer: PPO | Source: Ambulatory Visit

## 2018-06-12 ENCOUNTER — Other Ambulatory Visit: Payer: Self-pay

## 2018-06-12 ENCOUNTER — Encounter (HOSPITAL_COMMUNITY)
Admission: RE | Admit: 2018-06-12 | Discharge: 2018-06-12 | Disposition: A | Discharge status: Home / Self Care | Payer: PPO | Source: Ambulatory Visit | Attending: Neurological Surgery | Admitting: Neurological Surgery

## 2018-06-12 ENCOUNTER — Encounter (HOSPITAL_COMMUNITY): Payer: Self-pay

## 2018-06-12 DIAGNOSIS — Z79891 Long term (current) use of opiate analgesic: Secondary | ICD-10-CM | POA: Diagnosis not present

## 2018-06-12 DIAGNOSIS — Z791 Long term (current) use of non-steroidal anti-inflammatories (NSAID): Secondary | ICD-10-CM | POA: Insufficient documentation

## 2018-06-12 DIAGNOSIS — Z79899 Other long term (current) drug therapy: Secondary | ICD-10-CM | POA: Diagnosis not present

## 2018-06-12 DIAGNOSIS — G4700 Insomnia, unspecified: Secondary | ICD-10-CM | POA: Insufficient documentation

## 2018-06-12 DIAGNOSIS — Z01812 Encounter for preprocedural laboratory examination: Secondary | ICD-10-CM | POA: Diagnosis not present

## 2018-06-12 DIAGNOSIS — M4326 Fusion of spine, lumbar region: Secondary | ICD-10-CM | POA: Diagnosis not present

## 2018-06-12 LAB — SURGICAL PCR SCREEN
MRSA, PCR: NEGATIVE
Staphylococcus aureus: NEGATIVE

## 2018-06-12 LAB — BASIC METABOLIC PANEL
Anion gap: 8 (ref 5–15)
BUN: 17 mg/dL (ref 8–23)
CO2: 28 mmol/L (ref 22–32)
Calcium: 9.6 mg/dL (ref 8.9–10.3)
Chloride: 102 mmol/L (ref 98–111)
Creatinine, Ser: 0.59 mg/dL (ref 0.44–1.00)
GFR calc Af Amer: >60 mL/min (ref >60)
GFR calc non Af Amer: >60 mL/min (ref >60)
Glucose, Bld: 95 mg/dL (ref 70–99)
Potassium: 4.5 mmol/L (ref 3.5–5.1)
Sodium: 138 mmol/L (ref 135–145)

## 2018-06-12 LAB — CBC
HCT: 43.8 % (ref 36.0–46.0)
Hemoglobin: 14.7 g/dL (ref 12.0–15.0)
MCH: 32.2 pg (ref 26.0–34.0)
MCHC: 33.6 g/dL (ref 30.0–36.0)
MCV: 95.8 fL (ref 80.0–100.0)
Platelets: 339 10*3/uL (ref 150–400)
RBC: 4.57 MIL/uL (ref 3.87–5.11)
RDW: 11.3 % — ABNORMAL LOW (ref 11.5–15.5)
WBC: 5.1 10*3/uL (ref 4.0–10.5)
nRBC: 0 % (ref 0.0–0.2)

## 2018-06-12 LAB — TYPE AND SCREEN
ABO/RH(D): O POS
Antibody Screen: NEGATIVE

## 2018-06-12 NOTE — Progress Notes (Signed)
PCP - Marton Redwood @ [place] Cardiologist - na  Chest x-ray - na EKG - na Stress Test - na ECHO - na Cardiac Cath -na   Sleep Study - na CPAP -   Fasting Blood Sugar - na Checks Blood Sugar _____ times a day  Blood Thinner Instructions:na Aspirin Instructions:  Anesthesia review: needs pediatric equipment for intubation, would like to request Dr. Deatra Canter and Delphia Grates CRNA for case if possible.  Patient denies shortness of breath, fever, cough and chest pain at PAT appointment   Patient verbalized understanding of instructions that were given to them at the PAT appointment. Patient was also instructed that they will need to review over the PAT instructions again at home before surgery.

## 2018-06-12 NOTE — Progress Notes (Signed)
  Coronavirus Screening  Have you experienced the following symptoms:  Cough no Fever (>100.4F)  no Runny nose no Sore throat no Difficulty breathing/shortness of breath  no  Have you or a family member traveled in the last 14 days and where? no   If the patient indicates "YES" to the above questions, their PAT will be rescheduled to limit the exposure to others and, the surgeon will be notified. THE PATIENT WILL NEED TO BE ASYMPTOMATIC FOR 14 DAYS.   If the patient is not experiencing any of these symptoms, the PAT nurse will instruct them to NOT bring anyone with them to their appointment since they may have these symptoms or traveled as well.   Please remind your patients and families that hospital visitation restrictions are in effect and the importance of the restrictions.  

## 2018-06-13 ENCOUNTER — Other Ambulatory Visit (HOSPITAL_COMMUNITY)
Admission: RE | Admit: 2018-06-13 | Discharge: 2018-06-13 | Disposition: A | Discharge status: Home / Self Care | Payer: PPO | Source: Ambulatory Visit | Attending: Neurological Surgery | Admitting: Neurological Surgery

## 2018-06-13 DIAGNOSIS — Z01812 Encounter for preprocedural laboratory examination: Secondary | ICD-10-CM | POA: Diagnosis not present

## 2018-06-13 NOTE — Anesthesia Preprocedure Evaluation (Addendum)
Anesthesia Evaluation  Patient identified by MRN, date of birth, ID band Patient awake    Reviewed: Allergy & Precautions, H&P , NPO status , Patient's Chart, lab work & pertinent test results  History of Anesthesia Complications (+) DIFFICULT AIRWAY  Airway Mallampati: II   Neck ROM: full    Dental   Pulmonary neg pulmonary ROS,    breath sounds clear to auscultation       Cardiovascular negative cardio ROS   Rhythm:regular Rate:Normal     Neuro/Psych    GI/Hepatic   Endo/Other    Renal/GU      Musculoskeletal  (+) Arthritis ,   Abdominal   Peds  Hematology   Anesthesia Other Findings   Reproductive/Obstetrics                             Anesthesia Physical Anesthesia Plan  ASA: II  Anesthesia Plan: General   Post-op Pain Management:    Induction: Intravenous  PONV Risk Score and Plan: 3 and Ondansetron, Dexamethasone, Midazolam and Treatment may vary due to age or medical condition  Airway Management Planned: Oral ETT  Additional Equipment:   Intra-op Plan:   Post-operative Plan: Extubation in OR  Informed Consent: I have reviewed the patients History and Physical, chart, labs and discussed the procedure including the risks, benefits and alternatives for the proposed anesthesia with the patient or authorized representative who has indicated his/her understanding and acceptance.       Plan Discussed with: CRNA, Anesthesiologist and Surgeon  Anesthesia Plan Comments: (PAT note written 06/13/2018 by Myra Gianotti, PA-C. )       Anesthesia Quick Evaluation

## 2018-06-14 LAB — NOVEL CORONAVIRUS, NAA (HOSP ORDER, SEND-OUT TO REF LAB; TAT 18-24 HRS): SARS-CoV-2, NAA: NOT DETECTED

## 2018-06-17 ENCOUNTER — Inpatient Hospital Stay (HOSPITAL_COMMUNITY): Payer: PPO | Admitting: Physician Assistant

## 2018-06-17 ENCOUNTER — Observation Stay (HOSPITAL_COMMUNITY)
Admission: RE | Admit: 2018-06-17 | Discharge: 2018-06-17 | Disposition: A | Discharge status: Home / Self Care | Payer: PPO | Attending: Neurological Surgery | Admitting: Neurological Surgery

## 2018-06-17 ENCOUNTER — Inpatient Hospital Stay (HOSPITAL_COMMUNITY): Payer: PPO | Admitting: Anesthesiology

## 2018-06-17 ENCOUNTER — Inpatient Hospital Stay (HOSPITAL_COMMUNITY): Payer: PPO

## 2018-06-17 ENCOUNTER — Encounter (HOSPITAL_COMMUNITY): Payer: Self-pay

## 2018-06-17 ENCOUNTER — Encounter (HOSPITAL_COMMUNITY)
Admission: RE | Disposition: A | Discharge status: Home / Self Care | Payer: Self-pay | Source: Home / Self Care | Attending: Neurological Surgery

## 2018-06-17 ENCOUNTER — Other Ambulatory Visit: Payer: Self-pay

## 2018-06-17 DIAGNOSIS — G4700 Insomnia, unspecified: Secondary | ICD-10-CM | POA: Insufficient documentation

## 2018-06-17 DIAGNOSIS — M960 Pseudarthrosis after fusion or arthrodesis: Secondary | ICD-10-CM | POA: Insufficient documentation

## 2018-06-17 DIAGNOSIS — Z79899 Other long term (current) drug therapy: Secondary | ICD-10-CM | POA: Diagnosis not present

## 2018-06-17 DIAGNOSIS — M4307 Spondylolysis, lumbosacral region: Secondary | ICD-10-CM | POA: Diagnosis not present

## 2018-06-17 DIAGNOSIS — Z472 Encounter for removal of internal fixation device: Secondary | ICD-10-CM | POA: Diagnosis not present

## 2018-06-17 DIAGNOSIS — Z981 Arthrodesis status: Secondary | ICD-10-CM | POA: Diagnosis not present

## 2018-06-17 DIAGNOSIS — Z419 Encounter for procedure for purposes other than remedying health state, unspecified: Secondary | ICD-10-CM

## 2018-06-17 DIAGNOSIS — M1990 Unspecified osteoarthritis, unspecified site: Secondary | ICD-10-CM | POA: Diagnosis not present

## 2018-06-17 DIAGNOSIS — Z9889 Other specified postprocedural states: Secondary | ICD-10-CM | POA: Diagnosis not present

## 2018-06-17 DIAGNOSIS — Z791 Long term (current) use of non-steroidal anti-inflammatories (NSAID): Secondary | ICD-10-CM | POA: Diagnosis not present

## 2018-06-17 DIAGNOSIS — T8484XA Pain due to internal orthopedic prosthetic devices, implants and grafts, initial encounter: Principal | ICD-10-CM | POA: Diagnosis present

## 2018-06-17 DIAGNOSIS — Y831 Surgical operation with implant of artificial internal device as the cause of abnormal reaction of the patient, or of later complication, without mention of misadventure at the time of the procedure: Secondary | ICD-10-CM | POA: Insufficient documentation

## 2018-06-17 HISTORY — PX: HARDWARE REMOVAL: SHX979

## 2018-06-17 SURGERY — REMOVAL, HARDWARE
Anesthesia: General

## 2018-06-17 MED ORDER — THROMBIN 5000 UNITS EX SOLR
CUTANEOUS | Status: AC
  Filled 2018-06-17: qty 5000

## 2018-06-17 MED ORDER — GLYCOPYRROLATE PF 0.2 MG/ML IJ SOSY
PREFILLED_SYRINGE | INTRAMUSCULAR | Status: DC | PRN
  Administered 2018-06-17: .1 mg via INTRAVENOUS

## 2018-06-17 MED ORDER — DIAZEPAM 5 MG PO TABS: 5.0000 mg | ORAL_TABLET | Freq: Four times a day (QID) | ORAL | Status: DC | PRN

## 2018-06-17 MED ORDER — FENTANYL CITRATE (PF) 100 MCG/2ML IJ SOLN
INTRAMUSCULAR | Status: AC
  Filled 2018-06-17: qty 2

## 2018-06-17 MED ORDER — PHENYLEPHRINE 40 MCG/ML (10ML) SYRINGE FOR IV PUSH (FOR BLOOD PRESSURE SUPPORT)
PREFILLED_SYRINGE | INTRAVENOUS | Status: AC
  Filled 2018-06-17: qty 10

## 2018-06-17 MED ORDER — POLYETHYL GLYCOL-PROPYL GLYCOL 0.4-0.3 % OP GEL: Freq: Every day | OPHTHALMIC | Status: DC

## 2018-06-17 MED ORDER — ARTIFICIAL TEARS OPHTHALMIC OINT
TOPICAL_OINTMENT | OPHTHALMIC | Status: AC
  Filled 2018-06-17: qty 3.5

## 2018-06-17 MED ORDER — ACETAMINOPHEN 650 MG RE SUPP: 650.0000 mg | RECTAL | Status: DC | PRN

## 2018-06-17 MED ORDER — ONDANSETRON HCL 4 MG/2ML IJ SOLN: 4.0000 mg | Freq: Four times a day (QID) | INTRAMUSCULAR | Status: DC | PRN

## 2018-06-17 MED ORDER — MENTHOL 3 MG MT LOZG: 1.0000 | LOZENGE | OROMUCOSAL | Status: DC | PRN

## 2018-06-17 MED ORDER — SUCCINYLCHOLINE CHLORIDE 200 MG/10ML IV SOSY
PREFILLED_SYRINGE | INTRAVENOUS | Status: DC | PRN
  Administered 2018-06-17: 100 mg via INTRAVENOUS

## 2018-06-17 MED ORDER — SODIUM CHLORIDE 0.9 % IV SOLN: 250.0000 mL | INTRAVENOUS | Status: DC

## 2018-06-17 MED ORDER — SODIUM CHLORIDE 0.9% FLUSH: 3.0000 mL | INTRAVENOUS | Status: DC | PRN

## 2018-06-17 MED ORDER — DOCUSATE SODIUM 100 MG PO CAPS: 100.0000 mg | ORAL_CAPSULE | Freq: Two times a day (BID) | ORAL | Status: DC

## 2018-06-17 MED ORDER — SENNA 8.6 MG PO TABS: 1.0000 | ORAL_TABLET | Freq: Two times a day (BID) | ORAL | Status: DC

## 2018-06-17 MED ORDER — BISACODYL 10 MG RE SUPP: 10.0000 mg | Freq: Every day | RECTAL | Status: DC | PRN

## 2018-06-17 MED ORDER — DIAZEPAM 2 MG PO TABS: 2.0000 mg | ORAL_TABLET | Freq: Every day | ORAL | Status: DC | PRN

## 2018-06-17 MED ORDER — GLYCOPYRROLATE PF 0.2 MG/ML IJ SOSY
PREFILLED_SYRINGE | INTRAMUSCULAR | Status: AC
  Filled 2018-06-17: qty 1

## 2018-06-17 MED ORDER — DEXAMETHASONE SODIUM PHOSPHATE 10 MG/ML IJ SOLN
INTRAMUSCULAR | Status: AC
  Filled 2018-06-17: qty 2

## 2018-06-17 MED ORDER — ONDANSETRON HCL 4 MG/2ML IJ SOLN
INTRAMUSCULAR | Status: AC
  Filled 2018-06-17: qty 2

## 2018-06-17 MED ORDER — CHLORHEXIDINE GLUCONATE CLOTH 2 % EX PADS: 6.0000 | MEDICATED_PAD | Freq: Once | CUTANEOUS | Status: DC

## 2018-06-17 MED ORDER — HYDROCODONE-ACETAMINOPHEN 5-325 MG PO TABS
ORAL_TABLET | ORAL | Status: AC
  Filled 2018-06-17: qty 1

## 2018-06-17 MED ORDER — HYDROCODONE-ACETAMINOPHEN 5-325 MG PO TABS: 1.0000 | ORAL_TABLET | ORAL | 0 refills | Status: DC | PRN

## 2018-06-17 MED ORDER — FENTANYL CITRATE (PF) 250 MCG/5ML IJ SOLN
INTRAMUSCULAR | Status: AC
  Filled 2018-06-17: qty 5

## 2018-06-17 MED ORDER — BUPIVACAINE HCL (PF) 0.5 % IJ SOLN
INTRAMUSCULAR | Status: AC
  Filled 2018-06-17: qty 30

## 2018-06-17 MED ORDER — FENTANYL CITRATE (PF) 100 MCG/2ML IJ SOLN
INTRAMUSCULAR | Status: DC | PRN
  Administered 2018-06-17: 100 ug via INTRAVENOUS
  Administered 2018-06-17: 25 ug via INTRAVENOUS

## 2018-06-17 MED ORDER — MORPHINE SULFATE (PF) 2 MG/ML IV SOLN
2.0000 mg | INTRAVENOUS | Status: DC | PRN
  Administered 2018-06-17: 2 mg via INTRAVENOUS
  Filled 2018-06-17: qty 1

## 2018-06-17 MED ORDER — SODIUM CHLORIDE 0.9 % IV SOLN
INTRAVENOUS | Status: DC | PRN
  Administered 2018-06-17: 15 ug/min via INTRAVENOUS

## 2018-06-17 MED ORDER — ZOLPIDEM TARTRATE 5 MG PO TABS: 5.0000 mg | ORAL_TABLET | Freq: Every day | ORAL | Status: DC

## 2018-06-17 MED ORDER — SODIUM CHLORIDE 0.9% FLUSH: 3.0000 mL | Freq: Two times a day (BID) | INTRAVENOUS | Status: DC

## 2018-06-17 MED ORDER — DEXAMETHASONE SODIUM PHOSPHATE 10 MG/ML IJ SOLN
INTRAMUSCULAR | Status: DC | PRN
  Administered 2018-06-17: 10 mg via INTRAVENOUS

## 2018-06-17 MED ORDER — ALIGN 4 MG PO CAPS: 4.0000 mg | ORAL_CAPSULE | Freq: Every day | ORAL | Status: DC

## 2018-06-17 MED ORDER — KETOROLAC TROMETHAMINE 15 MG/ML IJ SOLN
7.5000 mg | Freq: Four times a day (QID) | INTRAMUSCULAR | Status: DC
  Administered 2018-06-17: 7.5 mg via INTRAVENOUS
  Filled 2018-06-17: qty 1

## 2018-06-17 MED ORDER — LORATADINE 10 MG PO TABS: 10.0000 mg | ORAL_TABLET | Freq: Every day | ORAL | Status: DC | PRN

## 2018-06-17 MED ORDER — CEFAZOLIN SODIUM-DEXTROSE 2-4 GM/100ML-% IV SOLN
2.0000 g | INTRAVENOUS | Status: AC
  Administered 2018-06-17: 2 g via INTRAVENOUS
  Filled 2018-06-17: qty 100

## 2018-06-17 MED ORDER — EPHEDRINE 5 MG/ML INJ
INTRAVENOUS | Status: AC
  Filled 2018-06-17: qty 10

## 2018-06-17 MED ORDER — ONDANSETRON HCL 4 MG PO TABS: 4.0000 mg | ORAL_TABLET | Freq: Four times a day (QID) | ORAL | Status: DC | PRN

## 2018-06-17 MED ORDER — DIAZEPAM 5 MG PO TABS: 5.0000 mg | ORAL_TABLET | Freq: Four times a day (QID) | ORAL | 0 refills | Status: DC | PRN

## 2018-06-17 MED ORDER — ROCURONIUM BROMIDE 10 MG/ML (PF) SYRINGE
PREFILLED_SYRINGE | INTRAVENOUS | Status: DC | PRN
  Administered 2018-06-17: 20 mg via INTRAVENOUS

## 2018-06-17 MED ORDER — SUCCINYLCHOLINE CHLORIDE 200 MG/10ML IV SOSY
PREFILLED_SYRINGE | INTRAVENOUS | Status: AC
  Filled 2018-06-17: qty 20

## 2018-06-17 MED ORDER — HYDROCODONE-ACETAMINOPHEN 5-325 MG PO TABS
1.0000 | ORAL_TABLET | Freq: Every day | ORAL | Status: DC | PRN
  Administered 2018-06-17: 1 via ORAL
  Filled 2018-06-17: qty 1

## 2018-06-17 MED ORDER — ONDANSETRON HCL 4 MG/2ML IJ SOLN
INTRAMUSCULAR | Status: DC | PRN
  Administered 2018-06-17: 4 mg via INTRAVENOUS

## 2018-06-17 MED ORDER — BACID PO TABS
2.0000 | ORAL_TABLET | Freq: Every day | ORAL | Status: DC
  Administered 2018-06-17: 2 via ORAL
  Filled 2018-06-17: qty 2

## 2018-06-17 MED ORDER — ALUM & MAG HYDROXIDE-SIMETH 200-200-20 MG/5ML PO SUSP: 30.0000 mL | Freq: Four times a day (QID) | ORAL | Status: DC | PRN

## 2018-06-17 MED ORDER — LIDOCAINE-EPINEPHRINE 1 %-1:100000 IJ SOLN
INTRAMUSCULAR | Status: DC | PRN
  Administered 2018-06-17: 3.5 mL

## 2018-06-17 MED ORDER — ROCURONIUM BROMIDE 10 MG/ML (PF) SYRINGE
PREFILLED_SYRINGE | INTRAVENOUS | Status: AC
  Filled 2018-06-17: qty 20

## 2018-06-17 MED ORDER — LIDOCAINE 2% (20 MG/ML) 5 ML SYRINGE
INTRAMUSCULAR | Status: DC | PRN
  Administered 2018-06-17: 40 mg via INTRAVENOUS

## 2018-06-17 MED ORDER — ACETAMINOPHEN 325 MG PO TABS: 650.0000 mg | ORAL_TABLET | ORAL | Status: DC | PRN

## 2018-06-17 MED ORDER — CEFAZOLIN SODIUM-DEXTROSE 2-4 GM/100ML-% IV SOLN
2.0000 g | Freq: Three times a day (TID) | INTRAVENOUS | Status: DC
  Administered 2018-06-17: 2 g via INTRAVENOUS
  Filled 2018-06-17 (×2): qty 100

## 2018-06-17 MED ORDER — 0.9 % SODIUM CHLORIDE (POUR BTL) OPTIME
TOPICAL | Status: DC | PRN
  Administered 2018-06-17: 15:00:00 1000 mL

## 2018-06-17 MED ORDER — FENTANYL CITRATE (PF) 100 MCG/2ML IJ SOLN
25.0000 ug | INTRAMUSCULAR | Status: DC | PRN
  Administered 2018-06-17: 25 ug via INTRAVENOUS

## 2018-06-17 MED ORDER — MIDAZOLAM HCL 2 MG/2ML IJ SOLN
INTRAMUSCULAR | Status: AC
  Filled 2018-06-17: qty 2

## 2018-06-17 MED ORDER — PHENOL 1.4 % MT LIQD: 1.0000 | OROMUCOSAL | Status: DC | PRN

## 2018-06-17 MED ORDER — BUPIVACAINE HCL (PF) 0.5 % IJ SOLN
INTRAMUSCULAR | Status: DC | PRN
  Administered 2018-06-17: 3.5 mL
  Administered 2018-06-17: 25 mL

## 2018-06-17 MED ORDER — POLYETHYLENE GLYCOL 3350 17 G PO PACK: 17.0000 g | PACK | Freq: Every day | ORAL | Status: DC | PRN

## 2018-06-17 MED ORDER — FLEET ENEMA 7-19 GM/118ML RE ENEM: 1.0000 | ENEMA | Freq: Once | RECTAL | Status: DC | PRN

## 2018-06-17 MED ORDER — THROMBIN 5000 UNITS EX SOLR
OROMUCOSAL | Status: DC | PRN
  Administered 2018-06-17: 15:00:00 via TOPICAL

## 2018-06-17 MED ORDER — SODIUM CHLORIDE 0.9 % IV SOLN
INTRAVENOUS | Status: DC | PRN
  Administered 2018-06-17: 15:00:00

## 2018-06-17 MED ORDER — MIDAZOLAM HCL 5 MG/5ML IJ SOLN
INTRAMUSCULAR | Status: DC | PRN
  Administered 2018-06-17: 1 mg via INTRAVENOUS

## 2018-06-17 MED ORDER — LACTATED RINGERS IV SOLN
INTRAVENOUS | Status: DC
  Administered 2018-06-17: 11:00:00 via INTRAVENOUS

## 2018-06-17 MED ORDER — PROPOFOL 10 MG/ML IV BOLUS
INTRAVENOUS | Status: DC | PRN
  Administered 2018-06-17: 140 mg via INTRAVENOUS

## 2018-06-17 MED ORDER — VANCOMYCIN HCL 1000 MG IV SOLR
INTRAVENOUS | Status: AC
  Filled 2018-06-17: qty 1000

## 2018-06-17 MED ORDER — LIDOCAINE 2% (20 MG/ML) 5 ML SYRINGE
INTRAMUSCULAR | Status: AC
  Filled 2018-06-17: qty 10

## 2018-06-17 MED ORDER — HYDROCODONE-ACETAMINOPHEN 5-325 MG PO TABS
2.0000 | ORAL_TABLET | ORAL | Status: DC | PRN
  Administered 2018-06-17: 1 via ORAL

## 2018-06-17 MED ORDER — LIDOCAINE-EPINEPHRINE 1 %-1:100000 IJ SOLN
INTRAMUSCULAR | Status: AC
  Filled 2018-06-17: qty 1

## 2018-06-17 MED ORDER — POLYVINYL ALCOHOL 1.4 % OP SOLN
1.0000 [drp] | OPHTHALMIC | Status: DC | PRN
  Filled 2018-06-17: qty 15

## 2018-06-17 SURGICAL SUPPLY — 58 items
ADH SKN CLS APL DERMABOND .7 (GAUZE/BANDAGES/DRESSINGS) ×1
APL SKNCLS STERI-STRIP NONHPOA (GAUZE/BANDAGES/DRESSINGS)
BAG DECANTER FOR FLEXI CONT (MISCELLANEOUS) ×2 IMPLANT
BENZOIN TINCTURE PRP APPL 2/3 (GAUZE/BANDAGES/DRESSINGS) IMPLANT
BLADE CLIPPER SURG (BLADE) IMPLANT
BUR CROSS CUT FISSURE 1.2 (BURR) ×5 IMPLANT
CANISTER SUCT 3000ML PPV (MISCELLANEOUS) ×2 IMPLANT
CARTRIDGE OIL MAESTRO DRILL (MISCELLANEOUS) IMPLANT
CONT SPEC 4OZ CLIKSEAL STRL BL (MISCELLANEOUS) ×1 IMPLANT
COVER WAND RF STERILE (DRAPES) ×1 IMPLANT
DECANTER SPIKE VIAL GLASS SM (MISCELLANEOUS) ×1 IMPLANT
DERMABOND ADVANCED (GAUZE/BANDAGES/DRESSINGS) ×1
DERMABOND ADVANCED .7 DNX12 (GAUZE/BANDAGES/DRESSINGS) IMPLANT
DEVICE DISSECT PLASMABLAD 3.0S (MISCELLANEOUS) IMPLANT
DIFFUSER DRILL AIR PNEUMATIC (MISCELLANEOUS) IMPLANT
DRAPE C-ARM 42X72 X-RAY (DRAPES) ×1 IMPLANT
DRAPE LAPAROTOMY 100X72 PEDS (DRAPES) IMPLANT
DRAPE LAPAROTOMY 100X72X124 (DRAPES) ×1 IMPLANT
DRAPE POUCH INSTRU U-SHP 10X18 (DRAPES) ×2 IMPLANT
DURAPREP 26ML APPLICATOR (WOUND CARE) ×2 IMPLANT
ELECT BLADE 4.0 EZ CLEAN MEGAD (MISCELLANEOUS) ×2
ELECT REM PT RETURN 9FT ADLT (ELECTROSURGICAL) ×2
ELECTRODE BLDE 4.0 EZ CLN MEGD (MISCELLANEOUS) ×1 IMPLANT
ELECTRODE REM PT RTRN 9FT ADLT (ELECTROSURGICAL) ×1 IMPLANT
GAUZE 4X4 16PLY RFD (DISPOSABLE) IMPLANT
GAUZE SPONGE 4X4 12PLY STRL (GAUZE/BANDAGES/DRESSINGS) ×2 IMPLANT
GLOVE BIOGEL PI IND STRL 8.5 (GLOVE) ×1 IMPLANT
GLOVE BIOGEL PI INDICATOR 8.5 (GLOVE) ×1
GLOVE ECLIPSE 8.5 STRL (GLOVE) ×3 IMPLANT
GOWN STRL REUS W/ TWL LRG LVL3 (GOWN DISPOSABLE) ×1 IMPLANT
GOWN STRL REUS W/ TWL XL LVL3 (GOWN DISPOSABLE) ×1 IMPLANT
GOWN STRL REUS W/TWL 2XL LVL3 (GOWN DISPOSABLE) ×2 IMPLANT
GOWN STRL REUS W/TWL LRG LVL3 (GOWN DISPOSABLE) ×2
GOWN STRL REUS W/TWL XL LVL3 (GOWN DISPOSABLE) ×2
HEMOSTAT POWDER KIT SURGIFOAM (HEMOSTASIS) ×1 IMPLANT
KIT BASIN OR (CUSTOM PROCEDURE TRAY) ×2 IMPLANT
KIT TURNOVER KIT B (KITS) ×2 IMPLANT
MARKER SKIN DUAL TIP RULER LAB (MISCELLANEOUS) ×3 IMPLANT
NEEDLE HYPO 22GX1.5 SAFETY (NEEDLE) ×2 IMPLANT
NS IRRIG 1000ML POUR BTL (IV SOLUTION) ×2 IMPLANT
OIL CARTRIDGE MAESTRO DRILL (MISCELLANEOUS)
PACK LAMINECTOMY NEURO (CUSTOM PROCEDURE TRAY) ×2 IMPLANT
PAD ARMBOARD 7.5X6 YLW CONV (MISCELLANEOUS) ×1 IMPLANT
PLASMABLADE 3.0S (MISCELLANEOUS) ×2
RASP 3.0MM (RASP) ×1 IMPLANT
SPONGE LAP 4X18 RFD (DISPOSABLE) IMPLANT
SPONGE SURGIFOAM ABS GEL SZ50 (HEMOSTASIS) ×1 IMPLANT
STAPLER SKIN PROX WIDE 3.9 (STAPLE) IMPLANT
STRIP CLOSURE SKIN 1/2X4 (GAUZE/BANDAGES/DRESSINGS) IMPLANT
SUT VIC AB 0 CT1 18XCR BRD8 (SUTURE) ×1 IMPLANT
SUT VIC AB 0 CT1 8-18 (SUTURE) ×2
SUT VIC AB 2-0 CP2 18 (SUTURE) ×2 IMPLANT
SUT VIC AB 3-0 SH 8-18 (SUTURE) ×2 IMPLANT
SWAB COLLECTION DEVICE MRSA (MISCELLANEOUS) IMPLANT
SWAB CULTURE ESWAB REG 1ML (MISCELLANEOUS) IMPLANT
TOWEL GREEN STERILE (TOWEL DISPOSABLE) ×2 IMPLANT
TOWEL GREEN STERILE FF (TOWEL DISPOSABLE) ×2 IMPLANT
WATER STERILE IRR 1000ML POUR (IV SOLUTION) ×2 IMPLANT

## 2018-06-17 NOTE — Anesthesia Procedure Notes (Signed)
Procedure Name: Intubation Date/Time: 06/17/2018 2:36 PM Performed by: Cleda Daub, CRNA Pre-anesthesia Checklist: Patient identified, Emergency Drugs available, Suction available and Patient being monitored Patient Re-evaluated:Patient Re-evaluated prior to induction Oxygen Delivery Method: Circle system utilized Preoxygenation: Pre-oxygenation with 100% oxygen Induction Type: IV induction and Rapid sequence Laryngoscope Size: Mac and 3 Grade View: Grade I Tube type: Oral Tube size: 6.0 mm Number of attempts: 1 Airway Equipment and Method: Stylet Placement Confirmation: ETT inserted through vocal cords under direct vision,  positive ETCO2 and breath sounds checked- equal and bilateral Secured at: 22 cm Tube secured with: Tape Dental Injury: Teeth and Oropharynx as per pre-operative assessment

## 2018-06-17 NOTE — Op Note (Signed)
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  Her fusion is healed solidly but [name] has significant pain at the protuberance of the iliac screws on either side.  She is been advised of hardware removal as she has a solid arthrodesis.  Procedure: The patient was brought to the operating room supine on the stretcher.  After the smooth induction of general endotracheal anesthesia she was carefully turned prone with the bony prominences being appropriately padded and protected.  Fluoroscopic guidance was used to localize the region of the screws and the hardware.  Then the back was cleansed with alcohol DuraPrep and draped in a sterile fashion and a midline incision that was previously made was reopened in the lower portion of the incision to expose the hardware.  The dissection was carried down through the subcutaneous tissues and then laterally in the subcutaneous plane to expose the tops of the screws and the iliac crest.  Using a monopolar cautery the screw was exposed the screw head could be entered and the Screw removed.  This was done on either side then by cauterizing along the side connector this was exposed to the main rod by cauterizing along the length of the rod the sacral screw could be identified a portion of the rod was then isolated on either side.  Then using a high-speed metal cutting bit the rod was transected.  This was done with isolation of all the soft tissues as best possible and then heavy irrigation the bit was a fine 1 mm diameter metal cutting bit was used to transect the rod the rods were then removed along with the transverse connectors.   The iliac screws were then each removed.  At the bed of the rod where the rod was cut the soft tissues were removed to remove any remnants of metallic artifact from the rod cutting.  Once this was ascertained hemostasis in the wound was checked on both sides and when verified the fascia was closed with 2-0 Vicryl in interrupted fashion and then 2-0 Vicryl was used in subcutaneous tissues 3-0 Vicryl was used to close subcuticular skin.  Blood loss was at a maximum 50 cc.  Patient tolerated procedure well is returned to recovery in stable condition

## 2018-06-17 NOTE — Social Work (Signed)
CSW acknowledging consult for SNF placement. Will follow for therapy recommendations.   Che Rachal, MSW, LCSWA [facility] [phone]   

## 2018-06-17 NOTE — H&P (Signed)
Catherine [name] is an 73 y.o. female.   Chief Complaint: Pain across both buttocks over the ilium bilaterally HPI: Catherine [name] is a 73 year old individual who about 2 years ago underwent surgical decompression and stabilization at L5-S1 for pseudoarthrosis she has had a previous fusion from L2-L5 and had subsequently had an L5-S1 fusion but this resulted in a pseudoarthrosis then it was plan to do an anterior lumbar interbody arthrodesis at L5-S1 in addition to posterior fixation to include the ileum.  This was performed and ultimately she fused nicely down to the sacrum.  Catherine [name] is a lady of thin build and she has had persistent pain over the ilium on both sides at the projection of the iliac screw on either side this is been injected in the past to give her transient relief however because the pain is persisting I have advised removal of the iliac screws at this time as her arthrodesis has been demonstrated to be solid across the sacrum she is now being admitted for this procedure to remove the iliac screws and that portion of the rod below the S1 region which accompanies that fixation.  Past Medical History:  Diagnosis Date  . Abnormal glandular Papanicolaou smear of cervix 11/04/2014  . Arthritis   . Back pain   . CIN I (cervical intraepithelial neoplasia I)    LEEP 2006 margins free      negative HR HPV 2008   . Difficult intubation    needs pediatric equipment  . Fever blister   . Herniated nucleus pulposus, L5-S1 08/10/2014  . Insomnia   . Neck pain   . Pseudoarthrosis of lumbar spine   . Spasmodic dysphonia   . 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 ABDOMINAL EXPOSURE N/A 01/23/2017   Procedure: ABDOMINAL EXPOSURE;  Surgeon: Rosetta Posner, MD;  [facility];  Service: Vascular;  Laterality: N/A;  . ANTERIOR LUMBAR FUSION N/A 01/23/2017   Procedure: Revision of Lumbar five-Sacral One Fusion with  Anterior lumbar interbody fusion, Dr. Sherren Mocha Early to co surgeon;  Surgeon: Kristeen Miss, MD;  Location: [facility];  Service: Neurosurgery;  Laterality: N/A;  . BACK SURGERY     Fusion  . CATARACT EXTRACTION W/ INTRAOCULAR LENS  IMPLANT, BILATERAL    . CERVICAL BIOPSY  W/ LOOP ELECTRODE EXCISION  2006  . CERVICAL CONIZATION W/BX N/A 12/28/2014   Procedure: CONIZATION CERVIX WITH BIOPSY;  Surgeon: Terrance Mass, MD;  Location: [facility];  Service: Gynecology;  Laterality: N/A;  . COLONOSCOPY    . COLPOSCOPY    . DILATION AND CURETTAGE OF UTERUS    . HYSTEROSCOPY W/D&C N/A 12/28/2014   Procedure: DILATATION AND CURETTAGE /HYSTEROSCOPY Diagnostic Hysteroscopy;  Surgeon: Terrance Mass, MD;  Location: [facility];  Service: Gynecology;  Laterality: N/A;  . JOINT REPLACEMENT     Left knee  . KNEE SURGERY     Rt-95,Lft.-98,Replacement-09  . LUMBAR LAMINECTOMY/DECOMPRESSION MICRODISCECTOMY Bilateral 08/10/2014   Procedure: Bilateral Lumbar five-Sacral one Diskectomy;  Surgeon: Kristeen Miss, MD;  Location: [facility];  Service: Neurosurgery;  Laterality: Bilateral;  Bilateral L5-S1 Diskectomy  . ROTATOR CUFF REPAIR  2004  . TONSILLECTOMY    . TUBAL LIGATION      Family History  Problem Relation Age of Onset  . Hypertension Mother   . Dementia Mother   . Hypertension Father   . Diabetes Father   . Heart disease Father   .  Heart failure Father   . Breast cancer Neg Hx    Social History:  reports that she has never smoked. She has never used smokeless tobacco. She reports current alcohol use of about 10.0 standard drinks of alcohol per week. She reports that she does not use drugs.  Allergies:  Allergies  Allergen Reactions  . Oxycodone Other (See Comments)    Hallucinations    Medications Prior to Admission  Medication Sig Dispense Refill  . celecoxib (CELEBREX) 200 MG capsule Take 200 mg by mouth daily after breakfast.     . Cholecalciferol (VITAMIN D3) 125 MCG (5000 UT) CAPS Take 5,000  Units by mouth daily.    . diazepam (VALIUM) 2 MG tablet Take 2 mg by mouth daily as needed for muscle spasms.    Marland Kitchen HYDROcodone-acetaminophen (NORCO/VICODIN) 5-325 MG tablet Take 1 tablet by mouth daily as needed for moderate pain.    Marland Kitchen loratadine (CLARITIN) 10 MG tablet Take 10 mg by mouth daily as needed for allergies.    Vladimir Faster Glycol-Propyl Glycol (SYSTANE OP) Place 1 drop into both eyes daily.     . Probiotic Product (ALIGN) 4 MG CAPS Take 4 mg by mouth daily.    . vitamin B-12 (CYANOCOBALAMIN) 1000 MCG tablet Take 1,000 mcg by mouth daily.    Marland Kitchen zolpidem (AMBIEN) 10 MG tablet Take 10 mg by mouth at bedtime.   0  . dexamethasone (DECADRON) 2 MG tablet Take 1 tablet (2 mg total) by mouth 2 (two) times daily. (Patient not taking: Reported on 06/10/2018) 4 tablet 0    No results found for this or any previous visit (from the past 48 hour(s)). No results found.  Review of Systems  Constitutional: Negative.   HENT: Negative.   Eyes: Negative.   Respiratory: Negative.   Cardiovascular: Negative.   Gastrointestinal: Negative.   Musculoskeletal: Positive for back pain.  Skin: Negative.   Neurological: Negative.   Endo/Heme/Allergies: Negative.   Psychiatric/Behavioral: Negative.     There were no vitals taken for this visit. Physical Exam  Constitutional: She is oriented to person, place, and time. She appears well-developed.  HENT:  Head: Normocephalic and atraumatic.  Left Ear: External ear normal.  Eyes: Pupils are equal, round, and reactive to light. Conjunctivae and EOM are normal.  Neck: Normal range of motion. Neck supple.  Cardiovascular: Normal rate and regular rhythm.  Respiratory: Effort normal.  GI: Bowel sounds are normal.  Musculoskeletal: Normal range of motion.  Neurological: She is alert and oriented to person, place, and time.  Skin: Skin is warm and dry.  Psychiatric: She has a normal mood and affect. Her behavior is normal. Judgment and thought content  normal.     Assessment/Plan Iliac screw pain secondary to iliac fixation.  Plan: Removal of iliac screws and lower portion of hardware below sacrum  Earleen Newport, MD 06/17/2018, 10:45 AM

## 2018-06-17 NOTE — Discharge Summary (Signed)
Physician Discharge Summary  Patient ID: Catherine [name] MRN: [id number] DOB/AGE: [birth year] 73 y.o.  Admit date: 06/17/2018 Discharge date: 06/17/2018  Admission Diagnoses: Painful lumbosacral hardware  Discharge Diagnoses: Painful lumbosacral hardware status post fusion L2 to the sacrum Active Problems:   Painful orthopaedic hardware [facility])   Discharged Condition: good  Hospital Course: Patient was admitted to undergo removal of hardware from the iliac crest attached to the sacrum.  She tolerated surgery well.  Consults: None  Significant Diagnostic Studies: None  Treatments: surgery: Removal of iliac screws and attachment to the sacrum  Discharge Exam: Blood pressure 121/78, pulse 63, temperature 98 F (36.7 C), resp. rate 16, height 5\' 2"  (1.575 m), weight 49 kg, SpO2 100 %. Incisions are clean and dry [place] and gait is intact  Disposition: Discharge disposition: 01-Home or Self Care       Discharge Instructions    Call MD for:  redness, tenderness, or signs of infection (pain, swelling, redness, odor or green/yellow discharge around incision site)   Complete by:  As directed    Call MD for:  severe uncontrolled pain   Complete by:  As directed    Call MD for:  temperature >100.4   Complete by:  As directed    Diet - low sodium heart healthy   Complete by:  As directed    Discharge instructions   Complete by:  As directed    Okay to shower. Do not apply salves or appointments to incision. No heavy lifting with the upper extremities greater than 15 pounds. May resume driving when not requiring pain medication and patient feels comfortable with doing so.   Incentive spirometry RT   Complete by:  As directed    Increase activity slowly   Complete by:  As directed      Allergies as of 06/17/2018      Reactions   Oxycodone Other (See Comments)   Hallucinations      Medication List    TAKE these medications   Align 4 MG Caps Take 4 mg by mouth daily.    celecoxib 200 MG capsule Commonly known as:  CELEBREX Take 200 mg by mouth daily after breakfast.   dexamethasone 2 MG tablet Commonly known as:  DECADRON Take 1 tablet (2 mg total) by mouth 2 (two) times daily.   diazepam 5 MG tablet Commonly known as:  VALIUM Take 1 tablet (5 mg total) by mouth every 6 (six) hours as needed for muscle spasms. What changed:    medication strength  how much to take  when to take this   HYDROcodone-acetaminophen 5-325 MG tablet Commonly known as:  NORCO/VICODIN Take 1 tablet by mouth every 4 (four) hours as needed for moderate pain. What changed:  when to take this   loratadine 10 MG tablet Commonly known as:  CLARITIN Take 10 mg by mouth daily as needed for allergies.   SYSTANE OP Place 1 drop into both eyes daily.   vitamin B-12 1000 MCG tablet Commonly known as:  CYANOCOBALAMIN Take 1,000 mcg by mouth daily.   Vitamin D3 125 MCG (5000 UT) Caps Take 5,000 Units by mouth daily.   zolpidem 10 MG tablet Commonly known as:  AMBIEN Take 10 mg by mouth at bedtime.        Signed: Blanchie Dessert Caylan Chenard 06/17/2018, 6:25 PM

## 2018-06-17 NOTE — Transfer of Care (Signed)
Immediate Anesthesia Transfer of Care Note  Patient: Catherine [name]  Procedure(s) Performed: Removal of bilateral iliac fixation (N/A )  Patient Location: PACU  Anesthesia Type:General  Level of Consciousness: awake, alert , oriented and patient cooperative  Airway & Oxygen Therapy: Patient Spontanous Breathing and Patient connected to face mask oxygen  Post-op Assessment: Report given to RN and Post -op Vital signs reviewed and stable  Post vital signs: Reviewed and stable  Last Vitals:  Vitals Value Taken Time  BP 136/79 06/17/2018  4:06 PM  Temp    Pulse 98 06/17/2018  4:12 PM  Resp 17 06/17/2018  4:12 PM  SpO2 98 % 06/17/2018  4:12 PM  Vitals shown include unvalidated device data.  Last Pain:  Vitals:   06/17/18 1057  TempSrc:   PainSc: 4          Complications: No apparent anesthesia complications

## 2018-06-17 NOTE — Progress Notes (Signed)
Patient's vitals stable, IV removed, pain medication given, discharge paperwork explained to patient and taken to personal vehicle with all patient belongings.   Catherine [name]

## 2018-06-18 ENCOUNTER — Encounter (HOSPITAL_COMMUNITY): Payer: Self-pay | Admitting: Neurological Surgery

## 2018-06-18 NOTE — Anesthesia Postprocedure Evaluation (Signed)
Anesthesia Post Note  Patient: Catherine [name]  Procedure(s) Performed: Removal of bilateral iliac fixation (N/A )     Patient location during evaluation: PACU Anesthesia Type: General Level of consciousness: awake and alert Pain management: pain level controlled Vital Signs Assessment: post-procedure vital signs reviewed and stable Respiratory status: spontaneous breathing, nonlabored ventilation, respiratory function stable and patient connected to nasal cannula oxygen Cardiovascular status: blood pressure returned to baseline and stable Postop Assessment: no apparent nausea or vomiting Anesthetic complications: no    Last Vitals:  Vitals:   06/17/18 1737 06/17/18 1946  BP: 121/78 102/67  Pulse: 63 65  Resp:    Temp: 36.7 C 36.6 C  SpO2: 100% 100%    Last Pain:  Vitals:   06/17/18 2051  TempSrc:   PainSc: [place]

## 2018-07-11 DIAGNOSIS — M1711 Unilateral primary osteoarthritis, right knee: Secondary | ICD-10-CM | POA: Diagnosis not present

## 2018-07-17 DIAGNOSIS — M1711 Unilateral primary osteoarthritis, right knee: Secondary | ICD-10-CM | POA: Diagnosis not present

## 2018-07-24 DIAGNOSIS — M25561 Pain in right knee: Secondary | ICD-10-CM | POA: Diagnosis not present

## 2018-07-24 DIAGNOSIS — M1711 Unilateral primary osteoarthritis, right knee: Secondary | ICD-10-CM | POA: Diagnosis not present

## 2018-08-25 ENCOUNTER — Other Ambulatory Visit: Payer: Self-pay | Admitting: Neurological Surgery

## 2018-08-25 DIAGNOSIS — Z981 Arthrodesis status: Secondary | ICD-10-CM

## 2018-08-26 ENCOUNTER — Other Ambulatory Visit (HOSPITAL_COMMUNITY): Payer: Self-pay | Admitting: Neurological Surgery

## 2018-08-26 DIAGNOSIS — Z981 Arthrodesis status: Secondary | ICD-10-CM

## 2018-08-29 ENCOUNTER — Ambulatory Visit (HOSPITAL_COMMUNITY)
Admission: RE | Admit: 2018-08-29 | Discharge: 2018-08-29 | Disposition: A | Discharge status: Home / Self Care | Payer: PPO | Source: Ambulatory Visit | Attending: Neurological Surgery | Admitting: Neurological Surgery

## 2018-08-29 ENCOUNTER — Other Ambulatory Visit: Payer: Self-pay

## 2018-08-29 DIAGNOSIS — Z981 Arthrodesis status: Secondary | ICD-10-CM | POA: Insufficient documentation

## 2018-08-29 DIAGNOSIS — M4606 Spinal enthesopathy, lumbar region: Secondary | ICD-10-CM | POA: Diagnosis not present

## 2018-08-29 DIAGNOSIS — M48061 Spinal stenosis, lumbar region without neurogenic claudication: Secondary | ICD-10-CM | POA: Diagnosis not present

## 2018-09-05 DIAGNOSIS — J385 Laryngeal spasm: Secondary | ICD-10-CM | POA: Diagnosis not present

## 2018-09-23 DIAGNOSIS — M1711 Unilateral primary osteoarthritis, right knee: Secondary | ICD-10-CM | POA: Diagnosis not present

## 2018-09-23 DIAGNOSIS — M25561 Pain in right knee: Secondary | ICD-10-CM | POA: Diagnosis not present

## 2018-10-08 DIAGNOSIS — Z85828 Personal history of other malignant neoplasm of skin: Secondary | ICD-10-CM | POA: Diagnosis not present

## 2018-10-08 DIAGNOSIS — D225 Melanocytic nevi of trunk: Secondary | ICD-10-CM | POA: Diagnosis not present

## 2018-10-08 DIAGNOSIS — C44519 Basal cell carcinoma of skin of other part of trunk: Secondary | ICD-10-CM | POA: Diagnosis not present

## 2018-10-08 DIAGNOSIS — D2361 Other benign neoplasm of skin of right upper limb, including shoulder: Secondary | ICD-10-CM | POA: Diagnosis not present

## 2018-10-08 DIAGNOSIS — D2271 Melanocytic nevi of right lower limb, including hip: Secondary | ICD-10-CM | POA: Diagnosis not present

## 2018-10-08 DIAGNOSIS — D2272 Melanocytic nevi of left lower limb, including hip: Secondary | ICD-10-CM | POA: Diagnosis not present

## 2018-10-08 DIAGNOSIS — D485 Neoplasm of uncertain behavior of skin: Secondary | ICD-10-CM | POA: Diagnosis not present

## 2018-10-08 DIAGNOSIS — D2262 Melanocytic nevi of left upper limb, including shoulder: Secondary | ICD-10-CM | POA: Diagnosis not present

## 2018-10-08 DIAGNOSIS — L814 Other melanin hyperpigmentation: Secondary | ICD-10-CM | POA: Diagnosis not present

## 2018-10-08 DIAGNOSIS — Z85820 Personal history of malignant melanoma of skin: Secondary | ICD-10-CM | POA: Diagnosis not present

## 2018-10-08 DIAGNOSIS — L570 Actinic keratosis: Secondary | ICD-10-CM | POA: Diagnosis not present

## 2018-10-08 DIAGNOSIS — L821 Other seborrheic keratosis: Secondary | ICD-10-CM | POA: Diagnosis not present

## 2018-10-25 DIAGNOSIS — Z20828 Contact with and (suspected) exposure to other viral communicable diseases: Secondary | ICD-10-CM | POA: Diagnosis not present

## 2018-11-27 ENCOUNTER — Other Ambulatory Visit: Payer: Self-pay

## 2018-11-27 DIAGNOSIS — M545 Low back pain: Secondary | ICD-10-CM | POA: Diagnosis not present

## 2018-11-27 DIAGNOSIS — Z20822 Contact with and (suspected) exposure to covid-19: Secondary | ICD-10-CM

## 2018-11-29 LAB — NOVEL CORONAVIRUS, NAA: SARS-CoV-2, NAA: NOT DETECTED

## 2018-12-02 DIAGNOSIS — M545 Low back pain: Secondary | ICD-10-CM | POA: Diagnosis not present

## 2018-12-09 DIAGNOSIS — M545 Low back pain: Secondary | ICD-10-CM | POA: Diagnosis not present

## 2018-12-10 DIAGNOSIS — M545 Low back pain: Secondary | ICD-10-CM | POA: Diagnosis not present

## 2018-12-12 ENCOUNTER — Other Ambulatory Visit: Payer: Self-pay

## 2018-12-12 DIAGNOSIS — Z20822 Contact with and (suspected) exposure to covid-19: Secondary | ICD-10-CM

## 2018-12-15 LAB — NOVEL CORONAVIRUS, NAA: SARS-CoV-2, NAA: NOT DETECTED

## 2018-12-16 DIAGNOSIS — M545 Low back pain: Secondary | ICD-10-CM | POA: Diagnosis not present

## 2018-12-23 DIAGNOSIS — M545 Low back pain: Secondary | ICD-10-CM | POA: Diagnosis not present

## 2018-12-25 DIAGNOSIS — L570 Actinic keratosis: Secondary | ICD-10-CM | POA: Diagnosis not present

## 2018-12-25 DIAGNOSIS — Z85828 Personal history of other malignant neoplasm of skin: Secondary | ICD-10-CM | POA: Diagnosis not present

## 2018-12-25 DIAGNOSIS — M545 Low back pain: Secondary | ICD-10-CM | POA: Diagnosis not present

## 2018-12-25 DIAGNOSIS — C44519 Basal cell carcinoma of skin of other part of trunk: Secondary | ICD-10-CM | POA: Diagnosis not present

## 2018-12-30 DIAGNOSIS — M545 Low back pain: Secondary | ICD-10-CM | POA: Diagnosis not present

## 2019-01-02 DIAGNOSIS — M545 Low back pain: Secondary | ICD-10-CM | POA: Diagnosis not present

## 2019-01-02 DIAGNOSIS — J385 Laryngeal spasm: Secondary | ICD-10-CM | POA: Diagnosis not present

## 2019-01-06 DIAGNOSIS — M545 Low back pain: Secondary | ICD-10-CM | POA: Diagnosis not present

## 2019-01-08 DIAGNOSIS — H04123 Dry eye syndrome of bilateral lacrimal glands: Secondary | ICD-10-CM | POA: Diagnosis not present

## 2019-01-08 DIAGNOSIS — H524 Presbyopia: Secondary | ICD-10-CM | POA: Diagnosis not present

## 2019-01-08 DIAGNOSIS — H26493 Other secondary cataract, bilateral: Secondary | ICD-10-CM | POA: Diagnosis not present

## 2019-01-09 DIAGNOSIS — M545 Low back pain: Secondary | ICD-10-CM | POA: Diagnosis not present

## 2019-01-13 DIAGNOSIS — M545 Low back pain: Secondary | ICD-10-CM | POA: Diagnosis not present

## 2019-01-21 ENCOUNTER — Ambulatory Visit: Payer: PPO | Attending: Internal Medicine

## 2019-01-21 DIAGNOSIS — U071 COVID-19: Secondary | ICD-10-CM | POA: Diagnosis not present

## 2019-01-21 DIAGNOSIS — R238 Other skin changes: Secondary | ICD-10-CM | POA: Diagnosis not present

## 2019-01-23 LAB — NOVEL CORONAVIRUS, NAA: SARS-CoV-2, NAA: NOT DETECTED

## 2019-02-12 DIAGNOSIS — M1711 Unilateral primary osteoarthritis, right knee: Secondary | ICD-10-CM | POA: Diagnosis not present

## 2019-02-16 DIAGNOSIS — G8929 Other chronic pain: Secondary | ICD-10-CM | POA: Diagnosis not present

## 2019-02-19 DIAGNOSIS — M1711 Unilateral primary osteoarthritis, right knee: Secondary | ICD-10-CM | POA: Diagnosis not present

## 2019-03-03 DIAGNOSIS — M1711 Unilateral primary osteoarthritis, right knee: Secondary | ICD-10-CM | POA: Diagnosis not present

## 2019-04-01 DIAGNOSIS — L821 Other seborrheic keratosis: Secondary | ICD-10-CM | POA: Diagnosis not present

## 2019-04-01 DIAGNOSIS — D2271 Melanocytic nevi of right lower limb, including hip: Secondary | ICD-10-CM | POA: Diagnosis not present

## 2019-04-01 DIAGNOSIS — D225 Melanocytic nevi of trunk: Secondary | ICD-10-CM | POA: Diagnosis not present

## 2019-04-01 DIAGNOSIS — L819 Disorder of pigmentation, unspecified: Secondary | ICD-10-CM | POA: Diagnosis not present

## 2019-04-01 DIAGNOSIS — Z85820 Personal history of malignant melanoma of skin: Secondary | ICD-10-CM | POA: Diagnosis not present

## 2019-04-01 DIAGNOSIS — D2272 Melanocytic nevi of left lower limb, including hip: Secondary | ICD-10-CM | POA: Diagnosis not present

## 2019-04-01 DIAGNOSIS — L820 Inflamed seborrheic keratosis: Secondary | ICD-10-CM | POA: Diagnosis not present

## 2019-04-01 DIAGNOSIS — Z85828 Personal history of other malignant neoplasm of skin: Secondary | ICD-10-CM | POA: Diagnosis not present

## 2019-04-01 DIAGNOSIS — L814 Other melanin hyperpigmentation: Secondary | ICD-10-CM | POA: Diagnosis not present

## 2019-04-03 DIAGNOSIS — J385 Laryngeal spasm: Secondary | ICD-10-CM | POA: Diagnosis not present

## 2019-04-20 ENCOUNTER — Ambulatory Visit: Payer: PPO | Attending: Internal Medicine

## 2019-04-20 DIAGNOSIS — Z20822 Contact with and (suspected) exposure to covid-19: Secondary | ICD-10-CM

## 2019-04-21 LAB — SARS-COV-2, NAA 2 DAY TAT

## 2019-04-21 LAB — NOVEL CORONAVIRUS, NAA: SARS-CoV-2, NAA: NOT DETECTED

## 2019-05-04 ENCOUNTER — Ambulatory Visit: Payer: PPO | Attending: Internal Medicine

## 2019-05-04 DIAGNOSIS — Z20822 Contact with and (suspected) exposure to covid-19: Secondary | ICD-10-CM | POA: Diagnosis not present

## 2019-05-05 LAB — NOVEL CORONAVIRUS, NAA: SARS-CoV-2, NAA: NOT DETECTED

## 2019-05-05 LAB — SARS-COV-2, NAA 2 DAY TAT

## 2019-05-13 DIAGNOSIS — R7301 Impaired fasting glucose: Secondary | ICD-10-CM | POA: Diagnosis not present

## 2019-05-13 DIAGNOSIS — Z79899 Other long term (current) drug therapy: Secondary | ICD-10-CM | POA: Diagnosis not present

## 2019-05-19 DIAGNOSIS — M1711 Unilateral primary osteoarthritis, right knee: Secondary | ICD-10-CM | POA: Diagnosis not present

## 2019-05-19 DIAGNOSIS — M25561 Pain in right knee: Secondary | ICD-10-CM | POA: Diagnosis not present

## 2019-05-20 DIAGNOSIS — M159 Polyosteoarthritis, unspecified: Secondary | ICD-10-CM | POA: Diagnosis not present

## 2019-05-20 DIAGNOSIS — R82998 Other abnormal findings in urine: Secondary | ICD-10-CM | POA: Diagnosis not present

## 2019-05-20 DIAGNOSIS — Z1331 Encounter for screening for depression: Secondary | ICD-10-CM | POA: Diagnosis not present

## 2019-05-20 DIAGNOSIS — G4700 Insomnia, unspecified: Secondary | ICD-10-CM | POA: Diagnosis not present

## 2019-05-20 DIAGNOSIS — R490 Dysphonia: Secondary | ICD-10-CM | POA: Diagnosis not present

## 2019-05-20 DIAGNOSIS — Z0000 Encounter for general adult medical examination without abnormal findings: Secondary | ICD-10-CM | POA: Diagnosis not present

## 2019-05-20 DIAGNOSIS — M5416 Radiculopathy, lumbar region: Secondary | ICD-10-CM | POA: Diagnosis not present

## 2019-05-20 DIAGNOSIS — R7301 Impaired fasting glucose: Secondary | ICD-10-CM | POA: Diagnosis not present

## 2019-05-20 DIAGNOSIS — Z1339 Encounter for screening examination for other mental health and behavioral disorders: Secondary | ICD-10-CM | POA: Diagnosis not present

## 2019-05-20 DIAGNOSIS — M48061 Spinal stenosis, lumbar region without neurogenic claudication: Secondary | ICD-10-CM | POA: Diagnosis not present

## 2019-05-26 ENCOUNTER — Other Ambulatory Visit: Payer: Self-pay | Admitting: Internal Medicine

## 2019-05-26 DIAGNOSIS — Z1231 Encounter for screening mammogram for malignant neoplasm of breast: Secondary | ICD-10-CM

## 2019-05-27 ENCOUNTER — Telehealth: Payer: Self-pay | Admitting: *Deleted

## 2019-05-27 DIAGNOSIS — N760 Acute vaginitis: Secondary | ICD-10-CM

## 2019-05-27 NOTE — Telephone Encounter (Signed)
Patient called requesting Dexa ordered placed at breast center. Order placed patient will call and schedule.

## 2019-05-28 ENCOUNTER — Other Ambulatory Visit: Payer: Self-pay

## 2019-05-28 ENCOUNTER — Ambulatory Visit
Admission: RE | Admit: 2019-05-28 | Discharge: 2019-05-28 | Disposition: A | Discharge status: Home / Self Care | Payer: PPO | Source: Ambulatory Visit | Attending: Internal Medicine | Admitting: Internal Medicine

## 2019-05-28 DIAGNOSIS — Z1231 Encounter for screening mammogram for malignant neoplasm of breast: Secondary | ICD-10-CM

## 2019-05-28 NOTE — Telephone Encounter (Signed)
Appointment scheduled on 07/07/19

## 2019-06-01 DIAGNOSIS — H10413 Chronic giant papillary conjunctivitis, bilateral: Secondary | ICD-10-CM | POA: Diagnosis not present

## 2019-06-01 DIAGNOSIS — H04123 Dry eye syndrome of bilateral lacrimal glands: Secondary | ICD-10-CM | POA: Diagnosis not present

## 2019-06-04 DIAGNOSIS — L239 Allergic contact dermatitis, unspecified cause: Secondary | ICD-10-CM | POA: Diagnosis not present

## 2019-06-04 DIAGNOSIS — Z85828 Personal history of other malignant neoplasm of skin: Secondary | ICD-10-CM | POA: Diagnosis not present

## 2019-06-08 ENCOUNTER — Other Ambulatory Visit: Payer: Self-pay

## 2019-06-09 ENCOUNTER — Encounter: Payer: Self-pay | Admitting: Nurse Practitioner

## 2019-06-09 ENCOUNTER — Ambulatory Visit (INDEPENDENT_AMBULATORY_CARE_PROVIDER_SITE_OTHER): Payer: PPO | Admitting: Nurse Practitioner

## 2019-06-09 VITALS — BP 118/76 | Ht 61.5 in | Wt 109.0 lb

## 2019-06-09 DIAGNOSIS — Z01419 Encounter for gynecological examination (general) (routine) without abnormal findings: Secondary | ICD-10-CM | POA: Diagnosis not present

## 2019-06-09 DIAGNOSIS — N870 Mild cervical dysplasia: Secondary | ICD-10-CM | POA: Diagnosis not present

## 2019-06-09 DIAGNOSIS — Z9189 Other specified personal risk factors, not elsewhere classified: Secondary | ICD-10-CM | POA: Diagnosis not present

## 2019-06-09 DIAGNOSIS — Z124 Encounter for screening for malignant neoplasm of cervix: Secondary | ICD-10-CM

## 2019-06-09 NOTE — Patient Instructions (Addendum)
Replenz over the counter Vitamin D 2000 units daily Multivitamin daily or Tums   Health Maintenance After Age 74 After age 35, you are at a higher risk for certain long-term diseases and infections as well as injuries from falls. Falls are a major cause of broken bones and head injuries in people who are older than age 48. Getting regular preventive care can help to keep you healthy and well. Preventive care includes getting regular testing and making lifestyle changes as recommended by your health care provider. Talk with your health care provider about:  Which screenings and tests you should have. A screening is a test that checks for a disease when you have no symptoms.  A diet and exercise plan that is right for you. What should I know about screenings and tests to prevent falls? Screening and testing are the best ways to find a health problem early. Early diagnosis and treatment give you the best chance of managing medical conditions that are common after age 72. Certain conditions and lifestyle choices may make you more likely to have a fall. Your health care provider may recommend:  Regular vision checks. Poor vision and conditions such as cataracts can make you more likely to have a fall. If you wear glasses, make sure to get your prescription updated if your vision changes.  Medicine review. Work with your health care provider to regularly review all of the medicines you are taking, including over-the-counter medicines. Ask your health care provider about any side effects that may make you more likely to have a fall. Tell your health care provider if any medicines that you take make you feel dizzy or sleepy.  Osteoporosis screening. Osteoporosis is a condition that causes the bones to get weaker. This can make the bones weak and cause them to break more easily.  Blood pressure screening. Blood pressure changes and medicines to control blood pressure can make you feel dizzy.  Strength  and balance checks. Your health care provider may recommend certain tests to check your strength and balance while standing, walking, or changing positions.  Foot health exam. Foot pain and numbness, as well as not wearing proper footwear, can make you more likely to have a fall.  Depression screening. You may be more likely to have a fall if you have a fear of falling, feel emotionally low, or feel unable to do activities that you used to do.  Alcohol use screening. Using too much alcohol can affect your balance and may make you more likely to have a fall. What actions can I take to lower my risk of falls? General instructions  Talk with your health care provider about your risks for falling. Tell your health care provider if: ? You fall. Be sure to tell your health care provider about all falls, even ones that seem minor. ? You feel dizzy, sleepy, or off-balance.  Take over-the-counter and prescription medicines only as told by your health care provider. These include any supplements.  Eat a healthy diet and maintain a healthy weight. A healthy diet includes low-fat dairy products, low-fat (lean) meats, and fiber from whole grains, beans, and lots of fruits and vegetables. Home safety  Remove any tripping hazards, such as rugs, cords, and clutter.  Install safety equipment such as grab bars in bathrooms and safety rails on stairs.  Keep rooms and walkways well-lit. Activity   Follow a regular exercise program to stay fit. This will help you maintain your balance. Ask your health care provider  what types of exercise are appropriate for you.  If you need a cane or walker, use it as recommended by your health care provider.  Wear supportive shoes that have nonskid soles. Lifestyle  Do not drink alcohol if your health care provider tells you not to drink.  If you drink alcohol, limit how much you have: ? 0-1 drink a day for women. ? 0-2 drinks a day for men.  Be aware of how  much alcohol is in your drink. In the [place] one drink equals one typical bottle of beer (12 oz), one-half glass of wine (5 oz), or one shot of hard liquor (1 oz).  Do not use any products that contain nicotine or tobacco, such as cigarettes and e-cigarettes. If you need help quitting, ask your health care provider. Summary  Having a healthy lifestyle and getting preventive care can help to protect your health and wellness after age 27.  Screening and testing are the best way to find a health problem early and help you avoid having a fall. Early diagnosis and treatment give you the best chance for managing medical conditions that are more common for people who are older than age 58.  Falls are a major cause of broken bones and head injuries in people who are older than age 10. Take precautions to prevent a fall at home.  Work with your health care provider to learn what changes you can make to improve your health and wellness and to prevent falls. This information is not intended to replace advice given to you by your health care provider. Make sure you discuss any questions you have with your health care provider. Document Revised: 05/01/2018 Document Reviewed: 11/21/2016 Elsevier Patient Education  2020 [employer].

## 2019-06-09 NOTE — Progress Notes (Signed)
   Catherine [name] October 29, 1945 [id number]   History:  74 y.o. MWF G4 P2 presents for breast and pelvic exam without GYN complaints. Postmenopausal - no HRT, complains of vaginal dryness.  June 2016 atypical glandular cells with a negative D&C.  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  Normal mammogram history, had biopsy with benign findings 20+ years ago.  Has had Shingrix as well as Zostavax.  History of melanoma on her chest, sees dermatology yearly.  Very active lifestyle, struggles with arthritis and receives joint injections.  History of 5 back surgeries, left knee replacement, and right shoulder surgery.  Doing a little better on Celebrex.  Had labs recently with PCP, we do not have these records but patient reports they were normal.   Gynecologic History No LMP recorded. Patient is postmenopausal.   Last Pap: 04/14/2018. Results were: normal Last mammogram: 05/28/2019. Results were: normal DEXA: 04/26/2016. T-score of L femur -0.5 Colonoscopy: 2011. Results: Mild diverticulosis  Past medical history, past surgical history, family history and social history were all reviewed and documented in the EPIC chart.  Retired Pharmacist, hospital, husband was a Hydrographic surveyor and just retired.  Has 3 new grandchildren in the past year with a total of 7.  ROS:  A ROS was performed and pertinent positives and negatives are included.  Exam:  Vitals:   06/09/19 1008  BP: 118/76  Weight: 109 lb (49.4 kg)  Height: 5' 1.5" (1.562 m)   Body mass index is 20.26 kg/m.  General appearance:  Normal Thyroid:  Symmetrical, normal in size, without palpable masses or nodularity. Respiratory  Auscultation:  Clear without wheezing or rhonchi Cardiovascular  Auscultation:  Regular rate, without rubs, murmurs or gallops  Edema/varicosities:  Not grossly evident Abdominal  Soft,nontender, without masses, guarding or rebound.  Liver/spleen:  No organomegaly noted  Hernia:  None appreciated  Skin  Inspection:  Grossly  normal   Breasts: Examined lying and sitting.   Right: Without masses, retractions, discharge or axillary adenopathy.   Left: Without masses, retractions, discharge or axillary adenopathy. Gentitourinary   Inguinal/mons:  Normal without inguinal adenopathy  External genitalia:  Normal  BUS/Urethra/Skene's glands:  Normal  Vagina:  Atrophic changes  Cervix:  Normal, stenotic  Uterus:  Normal in size, shape and contour.  Midline and mobile  Adnexa/parametria:     Rt: Without masses or tenderness.   Lt: Without masses or tenderness.  Anus and perineum: Normal  Digital rectal exam: Normal sphincter tone without palpated masses or tenderness  Assessment/Plan:  74 y.o. MWF G4P2 for breast and pelvic.    Well female exam with routine gynecological exam - Education provided on SBEs, importance of preventative screenings, current guidelines, high calcium diet, regular exercise, multivitamin and 2000 units vitamin D daily. Pap pending. Bone density scheduled for 07/13/2019. Plans to schedule colonoscopy at the end of the year. Recommended trying Replenz OTC every 2-3 days for lubrication and to continue to use lubrication during intercourse.   CIN I (cervical intraepithelial neoplasia I) - pap pending  Follow up in 1 year for annual     [place], 10:27 AM 06/09/2019

## 2019-06-10 DIAGNOSIS — M545 Low back pain: Secondary | ICD-10-CM | POA: Diagnosis not present

## 2019-06-10 DIAGNOSIS — M4306 Spondylolysis, lumbar region: Secondary | ICD-10-CM | POA: Diagnosis not present

## 2019-06-11 LAB — PAP IG W/ RFLX HPV ASCU

## 2019-06-16 ENCOUNTER — Ambulatory Visit (INDEPENDENT_AMBULATORY_CARE_PROVIDER_SITE_OTHER): Payer: PPO | Admitting: Nurse Practitioner

## 2019-06-16 ENCOUNTER — Other Ambulatory Visit: Payer: Self-pay

## 2019-06-16 ENCOUNTER — Encounter: Payer: Self-pay | Admitting: Nurse Practitioner

## 2019-06-16 VITALS — BP 118/76

## 2019-06-16 DIAGNOSIS — R87615 Unsatisfactory cytologic smear of cervix: Secondary | ICD-10-CM | POA: Diagnosis not present

## 2019-06-16 NOTE — Progress Notes (Signed)
Patient presented today for repeat pap smear due to insufficient cells on 06/09/2019. Difficult to identify cervix due to vaginal and uterine atrophy. No charge today. Will call with results.

## 2019-06-16 NOTE — Addendum Note (Signed)
Addended by: Nelva Nay on: 06/16/2019 04:15 PM   Modules accepted: Orders

## 2019-06-17 LAB — PAP IG W/ RFLX HPV ASCU

## 2019-07-03 DIAGNOSIS — J385 Laryngeal spasm: Secondary | ICD-10-CM | POA: Diagnosis not present

## 2019-07-07 ENCOUNTER — Other Ambulatory Visit: Payer: PPO

## 2019-08-11 DIAGNOSIS — M25561 Pain in right knee: Secondary | ICD-10-CM | POA: Diagnosis not present

## 2019-08-11 DIAGNOSIS — M1711 Unilateral primary osteoarthritis, right knee: Secondary | ICD-10-CM | POA: Diagnosis not present

## 2019-09-07 ENCOUNTER — Other Ambulatory Visit: Payer: PPO

## 2019-10-02 DIAGNOSIS — J385 Laryngeal spasm: Secondary | ICD-10-CM | POA: Diagnosis not present

## 2019-10-14 ENCOUNTER — Other Ambulatory Visit: Payer: PPO

## 2019-10-14 ENCOUNTER — Other Ambulatory Visit: Payer: Self-pay

## 2019-10-14 DIAGNOSIS — C44519 Basal cell carcinoma of skin of other part of trunk: Secondary | ICD-10-CM | POA: Diagnosis not present

## 2019-10-14 DIAGNOSIS — D2272 Melanocytic nevi of left lower limb, including hip: Secondary | ICD-10-CM | POA: Diagnosis not present

## 2019-10-14 DIAGNOSIS — Z20822 Contact with and (suspected) exposure to covid-19: Secondary | ICD-10-CM

## 2019-10-14 DIAGNOSIS — L814 Other melanin hyperpigmentation: Secondary | ICD-10-CM | POA: Diagnosis not present

## 2019-10-14 DIAGNOSIS — D225 Melanocytic nevi of trunk: Secondary | ICD-10-CM | POA: Diagnosis not present

## 2019-10-14 DIAGNOSIS — L570 Actinic keratosis: Secondary | ICD-10-CM | POA: Diagnosis not present

## 2019-10-14 DIAGNOSIS — L821 Other seborrheic keratosis: Secondary | ICD-10-CM | POA: Diagnosis not present

## 2019-10-14 DIAGNOSIS — Z85820 Personal history of malignant melanoma of skin: Secondary | ICD-10-CM | POA: Diagnosis not present

## 2019-10-14 DIAGNOSIS — C44612 Basal cell carcinoma of skin of right upper limb, including shoulder: Secondary | ICD-10-CM | POA: Diagnosis not present

## 2019-10-14 DIAGNOSIS — D485 Neoplasm of uncertain behavior of skin: Secondary | ICD-10-CM | POA: Diagnosis not present

## 2019-10-14 DIAGNOSIS — Z85828 Personal history of other malignant neoplasm of skin: Secondary | ICD-10-CM | POA: Diagnosis not present

## 2019-10-14 DIAGNOSIS — D2271 Melanocytic nevi of right lower limb, including hip: Secondary | ICD-10-CM | POA: Diagnosis not present

## 2019-10-16 LAB — SARS-COV-2, NAA 2 DAY TAT

## 2019-10-16 LAB — NOVEL CORONAVIRUS, NAA: SARS-CoV-2, NAA: NOT DETECTED

## 2019-10-26 DIAGNOSIS — M1711 Unilateral primary osteoarthritis, right knee: Secondary | ICD-10-CM | POA: Diagnosis not present

## 2019-11-16 DIAGNOSIS — M1711 Unilateral primary osteoarthritis, right knee: Secondary | ICD-10-CM | POA: Diagnosis not present

## 2019-11-17 ENCOUNTER — Ambulatory Visit: Payer: PPO

## 2019-11-24 ENCOUNTER — Other Ambulatory Visit: Payer: PPO

## 2019-11-24 ENCOUNTER — Ambulatory Visit: Payer: PPO

## 2019-11-24 DIAGNOSIS — M1711 Unilateral primary osteoarthritis, right knee: Secondary | ICD-10-CM | POA: Diagnosis not present

## 2019-11-27 ENCOUNTER — Other Ambulatory Visit: Payer: Self-pay

## 2019-11-27 ENCOUNTER — Ambulatory Visit
Admission: RE | Admit: 2019-11-27 | Discharge: 2019-11-27 | Disposition: A | Discharge status: Home / Self Care | Payer: PPO | Source: Ambulatory Visit | Attending: Nurse Practitioner | Admitting: Nurse Practitioner

## 2019-11-27 DIAGNOSIS — M8589 Other specified disorders of bone density and structure, multiple sites: Secondary | ICD-10-CM | POA: Diagnosis not present

## 2019-11-27 DIAGNOSIS — N760 Acute vaginitis: Secondary | ICD-10-CM

## 2019-11-27 DIAGNOSIS — Z780 Asymptomatic menopausal state: Secondary | ICD-10-CM | POA: Diagnosis not present

## 2019-12-02 DIAGNOSIS — M1711 Unilateral primary osteoarthritis, right knee: Secondary | ICD-10-CM | POA: Diagnosis not present

## 2019-12-25 DIAGNOSIS — J385 Laryngeal spasm: Secondary | ICD-10-CM | POA: Diagnosis not present

## 2019-12-31 DIAGNOSIS — H04123 Dry eye syndrome of bilateral lacrimal glands: Secondary | ICD-10-CM | POA: Diagnosis not present

## 2019-12-31 DIAGNOSIS — H524 Presbyopia: Secondary | ICD-10-CM | POA: Diagnosis not present

## 2019-12-31 DIAGNOSIS — H26493 Other secondary cataract, bilateral: Secondary | ICD-10-CM | POA: Diagnosis not present

## 2019-12-31 DIAGNOSIS — H10413 Chronic giant papillary conjunctivitis, bilateral: Secondary | ICD-10-CM | POA: Diagnosis not present

## 2020-01-09 ENCOUNTER — Other Ambulatory Visit: Payer: PPO

## 2020-01-09 DIAGNOSIS — Z20822 Contact with and (suspected) exposure to covid-19: Secondary | ICD-10-CM

## 2020-01-12 LAB — NOVEL CORONAVIRUS, NAA: SARS-CoV-2, NAA: NOT DETECTED

## 2020-01-25 DIAGNOSIS — M25561 Pain in right knee: Secondary | ICD-10-CM | POA: Diagnosis not present

## 2020-01-28 DIAGNOSIS — H26491 Other secondary cataract, right eye: Secondary | ICD-10-CM | POA: Diagnosis not present

## 2020-02-11 ENCOUNTER — Other Ambulatory Visit: Payer: PPO

## 2020-02-11 DIAGNOSIS — Z20822 Contact with and (suspected) exposure to covid-19: Secondary | ICD-10-CM

## 2020-02-12 DIAGNOSIS — J385 Laryngeal spasm: Secondary | ICD-10-CM | POA: Diagnosis not present

## 2020-02-13 LAB — SARS-COV-2, NAA 2 DAY TAT

## 2020-02-13 LAB — NOVEL CORONAVIRUS, NAA: SARS-CoV-2, NAA: NOT DETECTED

## 2020-02-25 DIAGNOSIS — M1711 Unilateral primary osteoarthritis, right knee: Secondary | ICD-10-CM | POA: Diagnosis not present

## 2020-03-16 DIAGNOSIS — H10413 Chronic giant papillary conjunctivitis, bilateral: Secondary | ICD-10-CM | POA: Diagnosis not present

## 2020-03-16 DIAGNOSIS — H26493 Other secondary cataract, bilateral: Secondary | ICD-10-CM | POA: Diagnosis not present

## 2020-03-16 DIAGNOSIS — H04123 Dry eye syndrome of bilateral lacrimal glands: Secondary | ICD-10-CM | POA: Diagnosis not present

## 2020-03-29 ENCOUNTER — Encounter: Payer: Self-pay | Admitting: Gastroenterology

## 2020-03-31 DIAGNOSIS — H26493 Other secondary cataract, bilateral: Secondary | ICD-10-CM | POA: Diagnosis not present

## 2020-04-14 DIAGNOSIS — D2271 Melanocytic nevi of right lower limb, including hip: Secondary | ICD-10-CM | POA: Diagnosis not present

## 2020-04-14 DIAGNOSIS — L814 Other melanin hyperpigmentation: Secondary | ICD-10-CM | POA: Diagnosis not present

## 2020-04-14 DIAGNOSIS — L570 Actinic keratosis: Secondary | ICD-10-CM | POA: Diagnosis not present

## 2020-04-14 DIAGNOSIS — Z85828 Personal history of other malignant neoplasm of skin: Secondary | ICD-10-CM | POA: Diagnosis not present

## 2020-04-14 DIAGNOSIS — L820 Inflamed seborrheic keratosis: Secondary | ICD-10-CM | POA: Diagnosis not present

## 2020-04-14 DIAGNOSIS — D2272 Melanocytic nevi of left lower limb, including hip: Secondary | ICD-10-CM | POA: Diagnosis not present

## 2020-05-06 DIAGNOSIS — J385 Laryngeal spasm: Secondary | ICD-10-CM | POA: Diagnosis not present

## 2020-05-23 ENCOUNTER — Other Ambulatory Visit: Payer: Self-pay | Admitting: Nurse Practitioner

## 2020-05-23 DIAGNOSIS — Z1231 Encounter for screening mammogram for malignant neoplasm of breast: Secondary | ICD-10-CM

## 2020-06-01 DIAGNOSIS — R7989 Other specified abnormal findings of blood chemistry: Secondary | ICD-10-CM | POA: Diagnosis not present

## 2020-06-01 DIAGNOSIS — R7301 Impaired fasting glucose: Secondary | ICD-10-CM | POA: Diagnosis not present

## 2020-06-01 DIAGNOSIS — Z79899 Other long term (current) drug therapy: Secondary | ICD-10-CM | POA: Diagnosis not present

## 2020-06-03 ENCOUNTER — Other Ambulatory Visit: Payer: Self-pay

## 2020-06-03 ENCOUNTER — Ambulatory Visit
Admission: RE | Admit: 2020-06-03 | Discharge: 2020-06-03 | Disposition: A | Discharge status: Home / Self Care | Payer: PPO | Source: Ambulatory Visit | Attending: Nurse Practitioner | Admitting: Nurse Practitioner

## 2020-06-03 DIAGNOSIS — Z1231 Encounter for screening mammogram for malignant neoplasm of breast: Secondary | ICD-10-CM | POA: Diagnosis not present

## 2020-06-07 DIAGNOSIS — M1711 Unilateral primary osteoarthritis, right knee: Secondary | ICD-10-CM | POA: Diagnosis not present

## 2020-06-08 ENCOUNTER — Other Ambulatory Visit (HOSPITAL_COMMUNITY): Payer: Self-pay | Admitting: Neurological Surgery

## 2020-06-08 ENCOUNTER — Other Ambulatory Visit: Payer: Self-pay | Admitting: Neurological Surgery

## 2020-06-08 DIAGNOSIS — G959 Disease of spinal cord, unspecified: Secondary | ICD-10-CM | POA: Diagnosis not present

## 2020-06-13 DIAGNOSIS — M8580 Other specified disorders of bone density and structure, unspecified site: Secondary | ICD-10-CM | POA: Diagnosis not present

## 2020-06-13 DIAGNOSIS — G4700 Insomnia, unspecified: Secondary | ICD-10-CM | POA: Diagnosis not present

## 2020-06-13 DIAGNOSIS — Z1389 Encounter for screening for other disorder: Secondary | ICD-10-CM | POA: Diagnosis not present

## 2020-06-13 DIAGNOSIS — M159 Polyosteoarthritis, unspecified: Secondary | ICD-10-CM | POA: Diagnosis not present

## 2020-06-13 DIAGNOSIS — R42 Dizziness and giddiness: Secondary | ICD-10-CM | POA: Diagnosis not present

## 2020-06-13 DIAGNOSIS — R7301 Impaired fasting glucose: Secondary | ICD-10-CM | POA: Diagnosis not present

## 2020-06-13 DIAGNOSIS — Z1331 Encounter for screening for depression: Secondary | ICD-10-CM | POA: Diagnosis not present

## 2020-06-13 DIAGNOSIS — Z0000 Encounter for general adult medical examination without abnormal findings: Secondary | ICD-10-CM | POA: Diagnosis not present

## 2020-06-13 DIAGNOSIS — R000 Tachycardia, unspecified: Secondary | ICD-10-CM | POA: Diagnosis not present

## 2020-06-13 DIAGNOSIS — G5601 Carpal tunnel syndrome, right upper limb: Secondary | ICD-10-CM | POA: Diagnosis not present

## 2020-06-13 DIAGNOSIS — R82998 Other abnormal findings in urine: Secondary | ICD-10-CM | POA: Diagnosis not present

## 2020-06-13 DIAGNOSIS — R490 Dysphonia: Secondary | ICD-10-CM | POA: Diagnosis not present

## 2020-06-13 DIAGNOSIS — M5416 Radiculopathy, lumbar region: Secondary | ICD-10-CM | POA: Diagnosis not present

## 2020-06-14 DIAGNOSIS — M1711 Unilateral primary osteoarthritis, right knee: Secondary | ICD-10-CM | POA: Diagnosis not present

## 2020-06-15 ENCOUNTER — Other Ambulatory Visit: Payer: Self-pay

## 2020-06-15 ENCOUNTER — Ambulatory Visit (HOSPITAL_COMMUNITY)
Admission: RE | Admit: 2020-06-15 | Discharge: 2020-06-15 | Disposition: A | Discharge status: Home / Self Care | Payer: PPO | Source: Ambulatory Visit | Attending: Neurological Surgery | Admitting: Neurological Surgery

## 2020-06-15 ENCOUNTER — Encounter: Payer: Self-pay | Admitting: Nurse Practitioner

## 2020-06-15 ENCOUNTER — Ambulatory Visit (INDEPENDENT_AMBULATORY_CARE_PROVIDER_SITE_OTHER): Payer: PPO | Admitting: Nurse Practitioner

## 2020-06-15 ENCOUNTER — Other Ambulatory Visit (HOSPITAL_COMMUNITY)
Admission: RE | Admit: 2020-06-15 | Discharge: 2020-06-15 | Disposition: A | Discharge status: Home / Self Care | Payer: PPO | Source: Ambulatory Visit | Attending: Nurse Practitioner | Admitting: Nurse Practitioner

## 2020-06-15 VITALS — BP 118/78 | Ht 61.0 in | Wt 108.0 lb

## 2020-06-15 DIAGNOSIS — Z124 Encounter for screening for malignant neoplasm of cervix: Secondary | ICD-10-CM | POA: Diagnosis not present

## 2020-06-15 DIAGNOSIS — Z01419 Encounter for gynecological examination (general) (routine) without abnormal findings: Secondary | ICD-10-CM | POA: Diagnosis not present

## 2020-06-15 DIAGNOSIS — Z9189 Other specified personal risk factors, not elsewhere classified: Secondary | ICD-10-CM

## 2020-06-15 DIAGNOSIS — M542 Cervicalgia: Secondary | ICD-10-CM | POA: Diagnosis not present

## 2020-06-15 DIAGNOSIS — G959 Disease of spinal cord, unspecified: Secondary | ICD-10-CM | POA: Insufficient documentation

## 2020-06-15 DIAGNOSIS — Z780 Asymptomatic menopausal state: Secondary | ICD-10-CM

## 2020-06-15 DIAGNOSIS — M8580 Other specified disorders of bone density and structure, unspecified site: Secondary | ICD-10-CM

## 2020-06-15 NOTE — Progress Notes (Signed)
   Catherine [name] 1945/10/15 [id number]   History:  75 y.o. [id number] presents for breast and pelvic exam without GYN complaints. Postmenopausal - no HRT, no bleeding. 2006 LEEP CIN-1, 2016 atypical glandular cells on pap with negative D & C, subsequent paps normal. History of melanoma on chest, sees derm annually.  Gynecologic History No LMP recorded. Patient is postmenopausal.   Contraception: post menopausal status  Health Maintenance Last Pap: No longer screening per guidelines Last mammogram: 06/03/2020. Results were: normal Last colonoscopy: 2011. Results were: Normal Last Dexa: 11/2019. Results were: T-score -1.2, FRAX 7.7% / 1.0%  Past medical history, past surgical history, family history and social history were all reviewed and documented in the EPIC chart. Married. 7 grandchildren. Retired.   ROS:  A ROS was performed and pertinent positives and negatives are included.  Exam:  Vitals:   06/15/20 1340  BP: 118/78  Weight: 108 lb (49 kg)  Height: 5\' 1"  (1.549 m)   Body mass index is 20.41 kg/m.  General appearance:  Normal Thyroid:  Symmetrical, normal in size, without palpable masses or nodularity. Respiratory  Auscultation:  Clear without wheezing or rhonchi Cardiovascular  Auscultation:  Regular rate, without rubs, murmurs or gallops  Edema/varicosities:  Not grossly evident Abdominal  Soft,nontender, without masses, guarding or rebound.  Liver/spleen:  No organomegaly noted  Hernia:  None appreciated  Skin  Inspection:  Grossly normal Breasts: Examined lying and sitting.   Right: Without masses, retractions, nipple discharge or axillary adenopathy.   Left: Without masses, retractions, nipple discharge or axillary adenopathy. Genitourinary   Inguinal/mons:  Normal without inguinal adenopathy  External genitalia:  Normal appearing vulva with no masses, tenderness, or lesions  BUS/Urethra/Skene's glands:  Normal  Vagina:  Normal appearing with normal color  and discharge, no lesions. Atrophic changes.   Cervix:  Normal appearing without discharge or lesions  Uterus:  Normal in size, shape and contour.  Midline and mobile, nontender  Adnexa/parametria:     Rt: Normal in size, without masses or tenderness.   Lt: Normal in size, without masses or tenderness.  Anus and perineum: Normal  Digital rectal exam: Normal sphincter tone without palpated masses or tenderness  Assessment/Plan:  75 y.o. [id number] for breast and pelvic exam.   Well female exam with routine gynecological exam - Education provided on SBEs, importance of preventative screenings, current guidelines, high calcium diet, regular exercise, and multivitamin daily. Labs with PCP.   Postmenopausal - no HRT, no bleeding.   Osteopenia, unspecified location - 11/2019 T-score -1.2 without elevated FRAX. Continue daily vitamin D supplement and regular exercise. Will repeat at 2-year interval as recommended.   Screening for cervical cancer -  2006 LEEP CIN-1, 2016 atypical glandular cells on pap with negative D & C, subsequent paps normal. Pap requested today. Pap with reflex obtained.   Screening for breast cancer - Normal mammogram history.  Continue annual screenings.  Normal breast exam today.  Screening for colon cancer - 2011 colonoscopy. She is aware she is overdue and has tried to call GI office to schedule but unable to get in touch with anyone.   Return in 1 year for annual.        Tamela Gammon DNP, 2:11 PM 06/15/2020

## 2020-06-15 NOTE — Patient Instructions (Signed)
Health Maintenance After Age 75 After age 75, you are at a higher risk for certain long-term diseases and infections as well as injuries from falls. Falls are a major cause of broken bones and head injuries in people who are older than age 75. Getting regular preventive care can help to keep you healthy and well. Preventive care includes getting regular testing and making lifestyle changes as recommended by your health care provider. Talk with your health care provider about:  Which screenings and tests you should have. A screening is a test that checks for a disease when you have no symptoms.  A diet and exercise plan that is right for you. What should I know about screenings and tests to prevent falls? Screening and testing are the best ways to find a health problem early. Early diagnosis and treatment give you the best chance of managing medical conditions that are common after age 75. Certain conditions and lifestyle choices may make you more likely to have a fall. Your health care provider may recommend:  Regular vision checks. Poor vision and conditions such as cataracts can make you more likely to have a fall. If you wear glasses, make sure to get your prescription updated if your vision changes.  Medicine review. Work with your health care provider to regularly review all of the medicines you are taking, including over-the-counter medicines. Ask your health care provider about any side effects that may make you more likely to have a fall. Tell your health care provider if any medicines that you take make you feel dizzy or sleepy.  Osteoporosis screening. Osteoporosis is a condition that causes the bones to get weaker. This can make the bones weak and cause them to break more easily.  Blood pressure screening. Blood pressure changes and medicines to control blood pressure can make you feel dizzy.  Strength and balance checks. Your health care provider may recommend certain tests to check your  strength and balance while standing, walking, or changing positions.  Foot health exam. Foot pain and numbness, as well as not wearing proper footwear, can make you more likely to have a fall.  Depression screening. You may be more likely to have a fall if you have a fear of falling, feel emotionally low, or feel unable to do activities that you used to do.  Alcohol use screening. Using too much alcohol can affect your balance and may make you more likely to have a fall. What actions can I take to lower my risk of falls? General instructions  Talk with your health care provider about your risks for falling. Tell your health care provider if: ? You fall. Be sure to tell your health care provider about all falls, even ones that seem minor. ? You feel dizzy, sleepy, or off-balance.  Take over-the-counter and prescription medicines only as told by your health care provider. These include any supplements.  Eat a healthy diet and maintain a healthy weight. A healthy diet includes low-fat dairy products, low-fat (lean) meats, and fiber from whole grains, beans, and lots of fruits and vegetables. Home safety  Remove any tripping hazards, such as rugs, cords, and clutter.  Install safety equipment such as grab bars in bathrooms and safety rails on stairs.  Keep rooms and walkways well-lit. Activity  Follow a regular exercise program to stay fit. This will help you maintain your balance. Ask your health care provider what types of exercise are appropriate for you.  If you need a cane or walker,   use it as recommended by your health care provider.  Wear supportive shoes that have nonskid soles.   Lifestyle  Do not drink alcohol if your health care provider tells you not to drink.  If you drink alcohol, limit how much you have: ? 0-1 drink a day for women. ? 0-2 drinks a day for men.  Be aware of how much alcohol is in your drink. In the [place] one drink equals one typical bottle of beer (12  oz), one-half glass of wine (5 oz), or one shot of hard liquor (1 oz).  Do not use any products that contain nicotine or tobacco, such as cigarettes and e-cigarettes. If you need help quitting, ask your health care provider. Summary  Having a healthy lifestyle and getting preventive care can help to protect your health and wellness after age 75.  Screening and testing are the best way to find a health problem early and help you avoid having a fall. Early diagnosis and treatment give you the best chance for managing medical conditions that are more common for people who are older than age 75.  Falls are a major cause of broken bones and head injuries in people who are older than age 75. Take precautions to prevent a fall at home.  Work with your health care provider to learn what changes you can make to improve your health and wellness and to prevent falls. This information is not intended to replace advice given to you by your health care provider. Make sure you discuss any questions you have with your health care provider. Document Revised: 05/01/2018 Document Reviewed: 11/21/2016 Elsevier Patient Education  2021 [employer]  

## 2020-06-16 LAB — CYTOLOGY - PAP: Diagnosis: NEGATIVE

## 2020-06-21 DIAGNOSIS — I480 Paroxysmal atrial fibrillation: Secondary | ICD-10-CM | POA: Diagnosis not present

## 2020-06-21 DIAGNOSIS — M1711 Unilateral primary osteoarthritis, right knee: Secondary | ICD-10-CM | POA: Diagnosis not present

## 2020-06-21 DIAGNOSIS — M48061 Spinal stenosis, lumbar region without neurogenic claudication: Secondary | ICD-10-CM | POA: Diagnosis not present

## 2020-06-21 DIAGNOSIS — U071 COVID-19: Secondary | ICD-10-CM | POA: Diagnosis not present

## 2020-06-22 DIAGNOSIS — I4891 Unspecified atrial fibrillation: Secondary | ICD-10-CM

## 2020-06-22 DIAGNOSIS — M4722 Other spondylosis with radiculopathy, cervical region: Secondary | ICD-10-CM | POA: Diagnosis not present

## 2020-06-22 HISTORY — DX: Unspecified atrial fibrillation: I48.91

## 2020-06-29 ENCOUNTER — Other Ambulatory Visit: Payer: Self-pay

## 2020-06-29 ENCOUNTER — Encounter (HOSPITAL_COMMUNITY): Payer: Self-pay | Admitting: Physician Assistant

## 2020-06-29 ENCOUNTER — Ambulatory Visit (HOSPITAL_COMMUNITY)
Admission: RE | Admit: 2020-06-29 | Discharge: 2020-06-29 | Disposition: A | Discharge status: Home / Self Care | Payer: PPO | Source: Ambulatory Visit | Attending: Physician Assistant | Admitting: Physician Assistant

## 2020-06-29 VITALS — BP 108/76 | HR 73 | Ht 61.0 in | Wt 106.8 lb

## 2020-06-29 DIAGNOSIS — I480 Paroxysmal atrial fibrillation: Secondary | ICD-10-CM | POA: Diagnosis not present

## 2020-06-29 DIAGNOSIS — Z7982 Long term (current) use of aspirin: Secondary | ICD-10-CM | POA: Diagnosis not present

## 2020-06-29 DIAGNOSIS — Z79899 Other long term (current) drug therapy: Secondary | ICD-10-CM | POA: Insufficient documentation

## 2020-06-29 DIAGNOSIS — M1990 Unspecified osteoarthritis, unspecified site: Secondary | ICD-10-CM | POA: Insufficient documentation

## 2020-06-29 DIAGNOSIS — Z981 Arthrodesis status: Secondary | ICD-10-CM | POA: Diagnosis not present

## 2020-06-29 NOTE — Progress Notes (Signed)
Primary Care Physician: Ginger Organ., MD Primary Cardiologist: none Primary Electrophysiologist: none Referring Physician: Dr [name] is a 75 y.o. female with a history of new onset atrial fibrillation who presents for consultation in the [facility].  The patient was initially diagnosed with atrial fibrillation 06/21/20 on her Apple Watch (strips personally reviewed). Patient has a CHADS2VASC score of 2. She has had intermittent palpitaitons and dizziness infrequently for years. She denies significant snoring but does consume alcohol daily.   Today, she denies symptoms of chest pain, shortness of breath, orthopnea, PND, lower extremity edema, dizziness, presyncope, syncope, snoring, daytime somnolence, bleeding, or neurologic sequela. The patient is tolerating medications without difficulties and is otherwise without complaint today.    Atrial Fibrillation Risk Factors:  she does not have symptoms or diagnosis of sleep apnea. she does not have a history of rheumatic fever. she does have a history of alcohol use. The patient does have a history of early familial atrial fibrillation or other arrhythmias. Brother has afib.  she has a BMI of Body mass index is 20.18 kg/m.Marland Kitchen Filed Weights   06/29/20 1416  Weight: 48.4 kg    Family History  Problem Relation Age of Onset  . Hypertension Mother   . Dementia Mother   . Hypertension Father   . Diabetes Father   . Heart disease Father   . Heart failure Father   . Parkinson's disease Brother   . Breast cancer Neg Hx      Atrial Fibrillation Management history:  Previous antiarrhythmic drugs: none Previous cardioversions: none Previous ablations: none CHADS2VASC score: 2 Anticoagulation history: none   Past Medical History:  Diagnosis Date  . Abnormal glandular Papanicolaou smear of cervix 11/04/2014  . Arthritis   . Back pain   . CIN I (cervical intraepithelial neoplasia  I)    LEEP 2006 margins free      negative HR HPV 2008   . Difficult intubation    needs pediatric equipment  . Fever blister   . Herniated nucleus pulposus, L5-S1 08/10/2014  . Insomnia   . Neck pain   . Pseudoarthrosis of lumbar spine   . Spasmodic dysphonia   . 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 ABDOMINAL EXPOSURE N/A 01/23/2017   Procedure: ABDOMINAL EXPOSURE;  Surgeon: Rosetta Posner, MD;  Location: [facility] OR;  Service: Vascular;  Laterality: N/A;  . ANTERIOR LUMBAR FUSION N/A 01/23/2017   Procedure: Revision of Lumbar five-Sacral One Fusion with Anterior lumbar interbody fusion, Dr. Sherren Mocha Early to co surgeon;  Surgeon: Kristeen Miss, MD;  Location: [place];  Service: Neurosurgery;  Laterality: N/A;  . BACK SURGERY     Fusion  . CATARACT EXTRACTION W/ INTRAOCULAR LENS  IMPLANT, BILATERAL    . CERVICAL BIOPSY  W/ LOOP ELECTRODE EXCISION  2006  . CERVICAL CONIZATION W/BX N/A 12/28/2014   Procedure: CONIZATION CERVIX WITH BIOPSY;  Surgeon: Terrance Mass, MD;  Location: [facility];  Service: Gynecology;  Laterality: N/A;  . COLONOSCOPY    . COLPOSCOPY    . DILATION AND CURETTAGE OF UTERUS    . HARDWARE REMOVAL N/A 06/17/2018   Procedure: Removal of bilateral iliac fixation;  Surgeon: Kristeen Miss, MD;  Location: [place];  Service: Neurosurgery;  Laterality: N/A;  Removal of bilateral iliac fixation  . HYSTEROSCOPY WITH D & C N/A 12/28/2014   Procedure: DILATATION AND CURETTAGE /  HYSTEROSCOPY Diagnostic Hysteroscopy;  Surgeon: Terrance Mass, MD;  Location: [facility];  Service: Gynecology;  Laterality: N/A;  . JOINT REPLACEMENT     Left knee  . KNEE SURGERY     Rt-95,Lft.-98,Replacement-09  . LUMBAR LAMINECTOMY/DECOMPRESSION MICRODISCECTOMY Bilateral 08/10/2014   Procedure: Bilateral Lumbar five-Sacral one Diskectomy;  Surgeon: Kristeen Miss, MD;  Location: [facility];  Service: Neurosurgery;  Laterality: Bilateral;   Bilateral L5-S1 Diskectomy  . ROTATOR CUFF REPAIR  2004  . TONSILLECTOMY    . TUBAL LIGATION      Current Outpatient Medications  Medication Sig Dispense Refill  . celecoxib (CELEBREX) 200 MG capsule Take 200 mg by mouth daily after breakfast.     . Cholecalciferol (VITAMIN D3) 125 MCG (5000 UT) CAPS Take 5,000 Units by mouth daily.    . diazepam (VALIUM) 2 MG tablet take 1 tablet by oral route 2 times every day as needed for muscle spasm    . HYDROcodone-acetaminophen (NORCO/VICODIN) 5-325 MG tablet Take 1 tablet by mouth every 4 (four) hours as needed for moderate pain. 40 tablet 0  . loratadine (CLARITIN) 10 MG tablet Take 10 mg by mouth daily as needed for allergies.    Vladimir Faster Glycol-Propyl Glycol (SYSTANE OP) Place 1 drop into both eyes daily.    . Probiotic Product (ALIGN) 4 MG CAPS Take 4 mg by mouth daily.    . vitamin B-12 (CYANOCOBALAMIN) 1000 MCG tablet Take 1,000 mcg by mouth daily.    Marland Kitchen zolpidem (AMBIEN) 10 MG tablet Take 10 mg by mouth at bedtime.   0   No current facility-administered medications for this encounter.    Allergies  Allergen Reactions  . Oxycodone Other (See Comments)    Hallucinations    Social History   Socioeconomic History  . Marital status: Married    Spouse name: Not on file  . Number of children: Not on file  . Years of education: Not on file  . Highest education level: Not on file  Occupational History  . Not on file  Tobacco Use  . Smoking status: Never Smoker  . Smokeless tobacco: Never Used  Vaping Use  . Vaping Use: Never used  Substance and Sexual Activity  . Alcohol use: Yes    Alcohol/week: 14.0 standard drinks    Types: 14 Standard drinks or equivalent per week    Comment: daily drink  . Drug use: No  . Sexual activity: Yes    Birth control/protection: Post-menopausal, Surgical    Comment: INSURANCE QUESTIONS DECLINED  Other Topics Concern  . Not on file  Social History Narrative  . Not on file   Social  Determinants of Health   Financial Resource Strain: Not on file  Food Insecurity: Not on file  Transportation Needs: Not on file  Physical Activity: Not on file  Stress: Not on file  Social Connections: Not on file  Intimate Partner Violence: Not on file     ROS- All systems are reviewed and negative except as per the HPI above.  Physical Exam: Vitals:   06/29/20 1416  BP: 108/76  Pulse: 73  Weight: 48.4 kg  Height: 5\' 1"  (1.549 m)    GEN- The patient is a well appearing elderly female, alert and oriented x 3 today.   Head- normocephalic, atraumatic Eyes-  Sclera clear, conjunctiva pink Ears- hearing intact Oropharynx- clear Neck- supple  Lungs- Clear to ausculation bilaterally, normal work of breathing Heart- Regular rate and rhythm, no murmurs, rubs or gallops  GI- soft, NT, ND, + BS Extremities- no clubbing, cyanosis, or edema MS- no significant deformity or atrophy Skin- no rash or lesion Psych- euthymic mood, full affect Neuro- strength and sensation are intact  Wt Readings from Last 3 Encounters:  06/29/20 48.4 kg  06/15/20 49 kg  06/09/19 49.4 kg    EKG today demonstrates  SR Vent. rate 73 BPM PR interval 186 ms QRS duration 74 ms QT/QTcB 388/427 ms  Epic records are reviewed at length today  CHA2DS2-VASc Score = 2  The patient's score is based upon: CHF History: No HTN History: No Diabetes History: No Stroke History: No Vascular Disease History: No Age Score: 1 Gender Score: 1      ASSESSMENT AND PLAN: 1. Paroxysmal Atrial Fibrillation (ICD10:  I48.0) The patient's CHA2DS2-VASc score is 2, indicating a 2.2% annual risk of stroke.   General education about afib provided and questions answered. We also discussed her stroke risk and the risks and benefits of anticoagulation. Check echocardiogram Anticoagulation not indicated at this time per guidelines. When she turns 75 later this year her CV score will be 3. Patient concerned about stopping  celebrex for arthritis. She will speak to PCP about alternatives.  Stop ASA today.  Apple Watch for home monitoring. If she has frequent recurrence of her afib, can consider AAD.    Follow up in the AF clinic in one month.    [facility] [address] [place], [place] [address] [phone] 06/29/2020 4:08 PM

## 2020-06-29 NOTE — Patient Instructions (Signed)
Stop aspirin.

## 2020-07-05 ENCOUNTER — Ambulatory Visit (HOSPITAL_COMMUNITY)
Admission: RE | Admit: 2020-07-05 | Discharge: 2020-07-05 | Disposition: A | Discharge status: Home / Self Care | Payer: PPO | Source: Ambulatory Visit | Attending: Physician Assistant | Admitting: Physician Assistant

## 2020-07-05 ENCOUNTER — Encounter (HOSPITAL_COMMUNITY): Payer: Self-pay | Admitting: Physician Assistant

## 2020-07-05 ENCOUNTER — Other Ambulatory Visit: Payer: Self-pay

## 2020-07-05 ENCOUNTER — Encounter (HOSPITAL_COMMUNITY): Payer: Self-pay

## 2020-07-05 VITALS — BP 100/76 | HR 81 | Ht 61.0 in | Wt 105.0 lb

## 2020-07-05 DIAGNOSIS — I480 Paroxysmal atrial fibrillation: Secondary | ICD-10-CM

## 2020-07-05 MED ORDER — DILTIAZEM HCL 30 MG PO TABS: ORAL_TABLET | ORAL | 1 refills | Status: DC

## 2020-07-05 MED ORDER — ELIQUIS 5 MG PO TABS: 5.0000 mg | ORAL_TABLET | Freq: Two times a day (BID) | ORAL | 3 refills | Status: DC

## 2020-07-05 NOTE — Progress Notes (Signed)
Electrophysiology TeleHealth Note   Audio/video telehealth visit is felt to be most appropriate for this patient at this time.  See consent below from today for patient consent regarding telehealth for the Atrial Fibrillation Clinic.    Date:  07/05/2020   ID:  Catherine [name], DOB [birth year], MRN [id number]  Location: home  Provider location: [address] [place], [place] [address] Evaluation Performed: Follow up  PCP:  Ginger Organ., MD  Primary Cardiologist:  none Primary Electrophysiologist: none    History of Present Illness: Catherine [name] is a 75 y.o. female who presents via audio/video conferencing for a telehealth visit today. The patient was initially diagnosed with atrial fibrillation 06/21/20 on her Apple Watch (strips personally reviewed). Patient has a CHADS2VASC score of 2. She has had intermittent palpitaitons and dizziness infrequently for years. She denies significant snoring but does consume alcohol daily.   On follow up today, patient reports that she did have an episode of afib on 6/12 which lasted 4-5 hours (Apple Watch strips personally reviewed) with symptoms of lightheadedness. There were no specific triggers that she could identify. No recent alcohol use. She also had another episode of palpitations later but did not capture this on her smart watch.   Today, she denies symptoms of chest pain, shortness of breath, orthopnea, PND, lower extremity edema, claudication, dizziness, presyncope, syncope, bleeding, or neurologic sequela. The patient is tolerating medications without difficulties and is otherwise without complaint today.     Atrial Fibrillation Risk Factors:  she does not have symptoms or diagnosis of sleep apnea. she does not have a history of rheumatic fever. she does have a history of alcohol use. The patient does have a history of early familial atrial fibrillation or other arrhythmias. Brother has afib.  she has a BMI of Body mass  index is 19.84 kg/m.Marland Kitchen Filed Weights   07/05/20 1512  Weight: 47.6 kg   Atrial Fibrillation Management history:   Previous antiarrhythmic drugs: none Previous cardioversions: none Previous ablations: none CHADS2VASC score: 2 Anticoagulation history: none  Past Medical History:  Diagnosis Date   Abnormal glandular Papanicolaou smear of cervix 11/04/2014   Arthritis    Back pain    CIN I (cervical intraepithelial neoplasia I)    LEEP 2006 margins free      negative HR HPV 2008    Difficult intubation    needs pediatric equipment   Fever blister    Herniated nucleus pulposus, L5-S1 08/10/2014   Insomnia    Neck pain    Pseudoarthrosis of lumbar spine    Spasmodic dysphonia    Spondylolysis, 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 Sherren Mocha Early to co surgeon;  Surgeon: Kristeen Miss, MD;  Location: [place];  Service: Neurosurgery;  Laterality: N/A;   BACK SURGERY     Fusion   CATARACT EXTRACTION W/ INTRAOCULAR LENS  IMPLANT, BILATERAL     CERVICAL BIOPSY  W/ LOOP ELECTRODE EXCISION  2006   CERVICAL CONIZATION W/BX N/A 12/28/2014   Procedure: CONIZATION CERVIX WITH BIOPSY;  Surgeon: Terrance Mass, MD;  Location: [facility];  Service: Gynecology;  Laterality: N/A;   COLONOSCOPY  COLPOSCOPY     DILATION AND CURETTAGE OF UTERUS     HARDWARE REMOVAL N/A 06/17/2018   Procedure: Removal of bilateral iliac fixation;  Surgeon: Kristeen Miss, MD;  Location: [place];  Service: Neurosurgery;  Laterality: N/A;  Removal of bilateral iliac fixation   HYSTEROSCOPY WITH D & C N/A 12/28/2014   Procedure: DILATATION AND CURETTAGE /HYSTEROSCOPY Diagnostic  Hysteroscopy;  Surgeon: Terrance Mass, MD;  Location: [facility];  Service: Gynecology;  Laterality: N/A;   JOINT REPLACEMENT     Left knee   KNEE SURGERY     Rt-95,Lft.-98,Replacement-09   LUMBAR LAMINECTOMY/DECOMPRESSION MICRODISCECTOMY Bilateral 08/10/2014   Procedure: Bilateral Lumbar five-Sacral one Diskectomy;  Surgeon: Kristeen Miss, MD;  Location: [place] [facility];  Service: Neurosurgery;  Laterality: Bilateral;  Bilateral L5-S1 Diskectomy   ROTATOR CUFF REPAIR  2004   TONSILLECTOMY     TUBAL LIGATION       Current Outpatient Medications  Medication Sig Dispense Refill   apixaban (ELIQUIS) 5 MG TABS tablet Take 1 tablet (5 mg total) by mouth 2 (two) times daily. 60 tablet 3   Cholecalciferol (VITAMIN D3) 125 MCG (5000 UT) CAPS Take 5,000 Units by mouth daily.     diazepam (VALIUM) 2 MG tablet take 1 tablet by oral route 2 times every day as needed for muscle spasm     diltiazem (CARDIZEM) 30 MG tablet Take 1 tablet every 4 hours AS NEEDED for HR >100 as long as top BP >100. 30 tablet 1   HYDROcodone-acetaminophen (NORCO/VICODIN) 5-325 MG tablet Take 1 tablet by mouth every 4 (four) hours as needed for moderate pain. 40 tablet 0   loratadine (CLARITIN) 10 MG tablet Take 10 mg by mouth daily as needed for allergies.     Polyethyl Glycol-Propyl Glycol (SYSTANE OP) Place 1 drop into both eyes daily.     Probiotic Product (ALIGN) 4 MG CAPS Take 4 mg by mouth daily.     vitamin B-12 (CYANOCOBALAMIN) 1000 MCG tablet Take 1,000 mcg by mouth daily.     zolpidem (AMBIEN) 10 MG tablet Take 10 mg by mouth at bedtime.   0   No current facility-administered medications for this encounter.    Allergies:   Oxycodone   Social History:  The patient  reports that she has never smoked. She has never used smokeless tobacco. She reports previous alcohol use of about 14.0 standard drinks of alcohol per week. She reports that she does not use drugs.   Family History:  The patient's  family history includes  Dementia in her mother; Diabetes in her father; Heart disease in her father; Heart failure in her father; Hypertension in her father and mother; Parkinson's disease in her brother.    ROS:  Please see the history of present illness.   All other systems are personally reviewed and negative.   Exam: Well appearing, alert and conversant, regular work of breathing,  good skin color  Recent Labs: No results found for requested labs within last 8760 hours.  personally reviewed    Other studies personally reviewed: The patient presents wearable device technology report for my review today. On my review, the patient presents Apple Watch/Kardia tracings from 07/03/20 and 07/05/20. The tracings reveal afib with RVR on 6/12 and SR HR 74 on 6/14.    CHA2DS2-VASc Score = 2  The patient's score is based upon: CHF History: No HTN History: No Diabetes History: No Stroke History: No Vascular Disease History: No Age Score: 1 Gender Score: 1  ASSESSMENT AND PLAN: 1. Paroxysmal Atrial Fibrillation (ICD10:  I48.0) The patient's CHA2DS2-VASc score is 2, indicating a 2.2% annual risk of stroke.   We again discussed her stroke risk and the risks and benefits of anticoagulation. Will plan to try Eliquis 5 mg BID as her CV score will soon be 3. She will need to be off NSAIDS. If she can not tolerate being off Celebrex, could consider speaking to Dr Quentin Ore about Watchman. Echocardiogram pending. Recheck bmet/cbc on follow up after starting Eliquis. Start diltiazem 30 mg PRN q 4 hours for heart racing. Apple Watch for home monitoring.     Follow-up in the AF clinic as scheduled.   Current medicines are reviewed at length with the patient today.   The patient does not have concerns regarding her medicines.  The following changes were made today:  start Eliquis, start diltiazem, stop Celebrex.   Labs/ tests ordered today include:  No orders of the defined types were placed in this  encounter.   Patient Risk:  after full review of this patients clinical status, I feel that they are at moderate risk at this time.   Today, I have spent 30 minutes with the patient with telehealth technology discussing the above.    Gwenlyn Perking PA-C 07/05/2020 4:42 PM  [facility] [address] [place], [place] [address] [phone]   I hereby voluntarily request, consent and authorize the [facility] and its employed or contracted physicians, physician assistants, nurse practitioners or other licensed health care professionals (the Practitioner), to provide me with telemedicine health care services (the "Services") as deemed necessary by the treating Practitioner. I acknowledge and consent to receive the Services by the Practitioner via telemedicine. I understand that the telemedicine visit will involve communicating with the Practitioner through live audiovisual communication technology and the disclosure of certain medical information by electronic transmission. I acknowledge that I have been given the opportunity to request an in-person assessment or other available alternative prior to the telemedicine visit and am voluntarily participating in the telemedicine visit.   I understand that I have the right to withhold or withdraw my consent to the use of telemedicine in the course of my care at any time, without affecting my right to future care or treatment, and that the Practitioner or I may terminate the telemedicine visit at any time. I understand that I have the right to inspect all information obtained and/or recorded in the course of the telemedicine visit and may receive copies of available information for a reasonable fee.  I understand that some of the potential risks of receiving the Services via telemedicine include:   Delay or interruption in medical evaluation due to technological equipment failure or disruption;  Information  transmitted may not be sufficient (e.g. poor resolution of images) to allow for appropriate medical decision making by the Practitioner; and/or  In rare instances, security protocols could fail, causing a breach of personal health information.   Furthermore, I acknowledge that it is my responsibility to provide information about my medical history, conditions and care that is complete and accurate to the best of my ability. I acknowledge that Practitioner's advice, recommendations, and/or decision may be based on factors not within their control, such as incomplete or inaccurate data provided by me or distortions of diagnostic images or specimens that may result from electronic transmissions. I understand that the practice of medicine is not an exact science and that Practitioner makes no warranties or guarantees regarding  treatment outcomes. I acknowledge that I will receive a copy of this consent concurrently upon execution via email to the email address I last provided but may also request a printed copy by calling the office of the [facility].  I understand that my insurance will be billed for this visit.   I have read or had this consent read to me.  I understand the contents of this consent, which adequately explains the benefits and risks of the Services being provided via telemedicine.  I have been provided ample opportunity to ask questions regarding this consent and the Services and have had my questions answered to my satisfaction.  I give my informed consent for the services to be provided through the use of telemedicine in my medical care  By participating in this telemedicine visit I agree to the above.

## 2020-07-11 ENCOUNTER — Ambulatory Visit (HOSPITAL_COMMUNITY): Payer: PPO | Admitting: Physician Assistant

## 2020-07-12 DIAGNOSIS — M1711 Unilateral primary osteoarthritis, right knee: Secondary | ICD-10-CM | POA: Diagnosis not present

## 2020-07-28 ENCOUNTER — Other Ambulatory Visit: Payer: Self-pay

## 2020-07-28 ENCOUNTER — Ambulatory Visit (HOSPITAL_COMMUNITY)
Admission: RE | Admit: 2020-07-28 | Discharge: 2020-07-28 | Disposition: A | Discharge status: Home / Self Care | Payer: PPO | Source: Ambulatory Visit | Attending: Physician Assistant | Admitting: Physician Assistant

## 2020-07-28 DIAGNOSIS — I480 Paroxysmal atrial fibrillation: Secondary | ICD-10-CM

## 2020-07-28 LAB — ECHOCARDIOGRAM COMPLETE
Area-P 1/2: 2.68 cm2
S' Lateral: 2.6 cm
Single Plane A4C EF: 58.1 %

## 2020-07-28 NOTE — Progress Notes (Signed)
  Echocardiogram 2D Echocardiogram has been performed.  Catherine [name] 07/28/2020, 3:42 PM

## 2020-07-29 ENCOUNTER — Ambulatory Visit (HOSPITAL_COMMUNITY)
Admission: RE | Admit: 2020-07-29 | Discharge: 2020-07-29 | Disposition: A | Discharge status: Home / Self Care | Payer: PPO | Source: Ambulatory Visit | Attending: Nurse Practitioner | Admitting: Nurse Practitioner

## 2020-07-29 ENCOUNTER — Encounter (HOSPITAL_COMMUNITY): Payer: Self-pay | Admitting: Nurse Practitioner

## 2020-07-29 VITALS — BP 118/68 | HR 61 | Ht 61.0 in | Wt 108.4 lb

## 2020-07-29 DIAGNOSIS — I480 Paroxysmal atrial fibrillation: Secondary | ICD-10-CM | POA: Diagnosis not present

## 2020-07-29 DIAGNOSIS — Z7901 Long term (current) use of anticoagulants: Secondary | ICD-10-CM | POA: Diagnosis not present

## 2020-07-29 DIAGNOSIS — D6869 Other thrombophilia: Secondary | ICD-10-CM | POA: Diagnosis not present

## 2020-07-29 DIAGNOSIS — M1990 Unspecified osteoarthritis, unspecified site: Secondary | ICD-10-CM | POA: Insufficient documentation

## 2020-07-29 DIAGNOSIS — Z79899 Other long term (current) drug therapy: Secondary | ICD-10-CM | POA: Insufficient documentation

## 2020-07-29 DIAGNOSIS — J385 Laryngeal spasm: Secondary | ICD-10-CM | POA: Diagnosis not present

## 2020-07-29 LAB — CBC
HCT: 43 % (ref 36.0–46.0)
Hemoglobin: 14.3 g/dL (ref 12.0–15.0)
MCH: 31 pg (ref 26.0–34.0)
MCHC: 33.3 g/dL (ref 30.0–36.0)
MCV: 93.3 fL (ref 80.0–100.0)
Platelets: 321 10*3/uL (ref 150–400)
RBC: 4.61 MIL/uL (ref 3.87–5.11)
RDW: 12 % (ref 11.5–15.5)
WBC: 5.6 10*3/uL (ref 4.0–10.5)
nRBC: 0 % (ref 0.0–0.2)

## 2020-07-29 LAB — BASIC METABOLIC PANEL
Anion gap: 4 — ABNORMAL LOW (ref 5–15)
BUN: 21 mg/dL (ref 8–23)
CO2: 27 mmol/L (ref 22–32)
Calcium: 9.4 mg/dL (ref 8.9–10.3)
Chloride: 104 mmol/L (ref 98–111)
Creatinine, Ser: 0.5 mg/dL (ref 0.44–1.00)
GFR, Estimated: >60 mL/min (ref >60)
Glucose, Bld: 98 mg/dL (ref 70–99)
Potassium: 4.3 mmol/L (ref 3.5–5.1)
Sodium: 135 mmol/L (ref 135–145)

## 2020-07-29 NOTE — Progress Notes (Signed)
Primary Care Physician: Ginger Organ., MD Primary Cardiologist: none Primary Electrophysiologist: none Referring Physician: Dr [name] is a 75 y.o. female with a history of new onset atrial fibrillation who presents for consultation in the [facility].  The patient was initially diagnosed with atrial fibrillation 06/21/20 on her Apple Watch (strips personally reviewed). Patient has a CHADS2VASC score of 2. She has had intermittent palpitaitons and dizziness infrequently for years. She denies significant snoring but does consume alcohol daily.   F/u in afib clinic 7/8 for f/u of echo done yesterday. It appears grossly normal. She states that she had one more episode of afib ( 2 total ) while playing 13 holes of golf. She was able to finish her golf game. It was over in a matter of hours. She reports low blood pressure with afib and RVR. She reports a systolic BP of 88 while she was in afib. Ekg shows SR at 61 bpm.   Today, she denies symptoms of chest pain, shortness of breath, orthopnea, PND, lower extremity edema, dizziness, presyncope, syncope, snoring, daytime somnolence, bleeding, or neurologic sequela. The patient is tolerating medications without difficulties and is otherwise without complaint today.    Atrial Fibrillation Risk Factors:  she does not have symptoms or diagnosis of sleep apnea. she does not have a history of rheumatic fever. she does have a history of alcohol use. The patient does have a history of early familial atrial fibrillation or other arrhythmias. Brother has afib.  she has a BMI of There is no height or weight on file to calculate BMI.. There were no vitals filed for this visit.   Family History  Problem Relation Age of Onset   Hypertension Mother    Dementia Mother    Hypertension Father    Diabetes Father    Heart disease Father    Heart failure Father    Parkinson's disease Brother    Breast cancer  Neg Hx      Atrial Fibrillation Management history:  Previous antiarrhythmic drugs: none Previous cardioversions: none Previous ablations: none CHADS2VASC score: 2 Anticoagulation history: none   Past Medical History:  Diagnosis Date   Abnormal glandular Papanicolaou smear of cervix 11/04/2014   Arthritis    Back pain    CIN I (cervical intraepithelial neoplasia I)    LEEP 2006 margins free      negative HR HPV 2008    Difficult intubation    needs pediatric equipment   Fever blister    Herniated nucleus pulposus, L5-S1 08/10/2014   Insomnia    Neck pain    Pseudoarthrosis of lumbar spine    Spasmodic dysphonia    Spondylolysis, 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 Sherren Mocha Early to co surgeon;  Surgeon: Kristeen Miss, MD;  Location: [facility];  Service: Neurosurgery;  Laterality: N/A;   BACK SURGERY     Fusion   CATARACT EXTRACTION W/ INTRAOCULAR LENS  IMPLANT, BILATERAL     CERVICAL BIOPSY  W/ LOOP ELECTRODE EXCISION  2006   CERVICAL CONIZATION W/BX N/A 12/28/2014   Procedure: CONIZATION CERVIX WITH BIOPSY;  Surgeon: Elita Quick  Linna Caprice, MD;  Location: [facility];  Service: Gynecology;  Laterality: N/A;   COLONOSCOPY     COLPOSCOPY     DILATION AND CURETTAGE OF UTERUS     HARDWARE REMOVAL N/A 06/17/2018   Procedure: Removal of bilateral iliac fixation;  Surgeon: Kristeen Miss, MD;  Location: [facility];  Service: Neurosurgery;  Laterality: N/A;  Removal of bilateral iliac fixation   HYSTEROSCOPY WITH D & C N/A 12/28/2014   Procedure: DILATATION AND CURETTAGE /HYSTEROSCOPY Diagnostic Hysteroscopy;  Surgeon: Terrance Mass, MD;  Location: [facility];   Service: Gynecology;  Laterality: N/A;   JOINT REPLACEMENT     Left knee   KNEE SURGERY     Rt-95,Lft.-98,Replacement-09   LUMBAR LAMINECTOMY/DECOMPRESSION MICRODISCECTOMY Bilateral 08/10/2014   Procedure: Bilateral Lumbar five-Sacral one Diskectomy;  Surgeon: Kristeen Miss, MD;  Location: [facility];  Service: Neurosurgery;  Laterality: Bilateral;  Bilateral L5-S1 Diskectomy   ROTATOR CUFF REPAIR  2004   TONSILLECTOMY     TUBAL LIGATION      Current Outpatient Medications  Medication Sig Dispense Refill   apixaban (ELIQUIS) 5 MG TABS tablet Take 1 tablet (5 mg total) by mouth 2 (two) times daily. 60 tablet 3   Cholecalciferol (VITAMIN D3) 125 MCG (5000 UT) CAPS Take 5,000 Units by mouth daily.     diazepam (VALIUM) 2 MG tablet take 1 tablet by oral route 2 times every day as needed for muscle spasm     diltiazem (CARDIZEM) 30 MG tablet Take 1 tablet every 4 hours AS NEEDED for HR >100 as long as top BP >100. 30 tablet 1   HYDROcodone-acetaminophen (NORCO/VICODIN) 5-325 MG tablet Take 1 tablet by mouth every 4 (four) hours as needed for moderate pain. 40 tablet 0   loratadine (CLARITIN) 10 MG tablet Take 10 mg by mouth daily as needed for allergies.     Polyethyl Glycol-Propyl Glycol (SYSTANE OP) Place 1 drop into both eyes daily.     Probiotic Product (ALIGN) 4 MG CAPS Take 4 mg by mouth daily.     vitamin B-12 (CYANOCOBALAMIN) 1000 MCG tablet Take 1,000 mcg by mouth daily.     zolpidem (AMBIEN) 10 MG tablet Take 10 mg by mouth at bedtime.   0   No current facility-administered medications for this encounter.    Allergies  Allergen Reactions   Oxycodone Other (See Comments)    Hallucinations    Social History   Socioeconomic History   Marital status: Married    Spouse name: Not on file   Number of children: Not on file   Years of education: Not on file   Highest education level: Not on file  Occupational History   Not on file  Tobacco Use   Smoking status: Never    Smokeless tobacco: Never  Vaping Use   Vaping Use: Never used  Substance and Sexual Activity   Alcohol use: Not Currently    Alcohol/week: 14.0 standard drinks    Types: 14 Standard drinks or equivalent per week    Comment: daily drink   Drug use: No   Sexual activity: Yes    Birth control/protection: Post-menopausal, Surgical    Comment: INSURANCE QUESTIONS DECLINED  Other Topics Concern   Not on file  Social History Narrative   Not on file   Social Determinants of Health   Financial Resource Strain: Not on file  Food Insecurity: Not on file  Transportation Needs: Not on file  Physical Activity: Not on file  Stress: Not  on file  Social Connections: Not on file  Intimate Partner Violence: Not on file     ROS- All systems are reviewed and negative except as per the HPI above.  Physical Exam: There were no vitals filed for this visit.   GEN- The patient is a well appearing elderly female, alert and oriented x 3 today.   Head- normocephalic, atraumatic Eyes-  Sclera clear, conjunctiva pink Ears- hearing intact Oropharynx- clear Neck- supple  Lungs- Clear to ausculation bilaterally, normal work of breathing Heart- Regular rate and rhythm, no murmurs, rubs or gallops  GI- soft, NT, ND, + BS Extremities- no clubbing, cyanosis, or edema MS- no significant deformity or atrophy Skin- no rash or lesion Psych- euthymic mood, full affect Neuro- strength and sensation are intact  Wt Readings from Last 3 Encounters:  07/05/20 47.6 kg  06/29/20 48.4 kg  06/15/20 49 kg    EKG today demonstrates  SR 61 BPM PR interval 192 ms QRS duration 84 ms QT/QTcB 398/400 ms   Epic records are reviewed at length today  CHA2DS2-VASc Score = 2  The patient's score is based upon: CHF History: No HTN History: No Diabetes History: No Stroke History: No Vascular Disease History: No Age Score: 1 Gender Score: 1      ASSESSMENT AND PLAN: 1. Paroxysmal Atrial Fibrillation  (ICD10:  I48.0) She has had a total of 2 episodes, self converted  I see an issue with daily rate control  meds with soft BP's and HR of 60 bpm  When she goes into afib she has rapid v rates and low BP's We discussed if that occurs, she can try to drink a glass of water to increase her BP and then try taking just 1/2 of cardizem 30 mg to see how that dose would work If that drops BP drink another glass of water If unstable with afib go to the ER  I feel rhythm control would be a concern as well, as Multaq would lower HR and rate control needed for flecainide would lower BP and HR  Because of these issues she may be a good front line ablation candidate if afib burden increases   2. CHA2DS2VASc  score of 3 (female and turns 39 in September)  She is off her NSAIDs for management of her arthritis and tylenol does not work for her She may want to consider Watchman so she will not have to be on long term anticoagulation Right now, she wishes to defer  this   Follow up in the AF clinic  in September with Adline Peals, [place] PA-C [facility] [address] [place], [place] [address] [phone] 07/29/2020 8:38 AM

## 2020-09-02 ENCOUNTER — Other Ambulatory Visit (HOSPITAL_COMMUNITY): Payer: Self-pay

## 2020-09-02 ENCOUNTER — Telehealth (HOSPITAL_COMMUNITY): Payer: Self-pay | Admitting: Physician Assistant

## 2020-09-02 MED ORDER — CELECOXIB 200 MG PO CAPS: 200.0000 mg | ORAL_CAPSULE | Freq: Every morning | ORAL | Status: DC

## 2020-09-02 NOTE — Telephone Encounter (Signed)
Patient called clinic to discuss anticoagulation. She reports that her quality of life is very poor since stopping Celebrex in order to start anticoagulation. We discussed the guidelines that with her turning 75 next month, she would have a CV score of 3 and anticoagulation would be indicated. She voices understanding of her stroke risk and has decided to stop Eliquis and resume Celebrex. She is interested in the Watchman procedure but wants to get her knee surgery first.

## 2020-09-06 DIAGNOSIS — M1711 Unilateral primary osteoarthritis, right knee: Secondary | ICD-10-CM | POA: Diagnosis not present

## 2020-09-23 ENCOUNTER — Other Ambulatory Visit: Payer: Self-pay

## 2020-09-23 ENCOUNTER — Other Ambulatory Visit: Payer: Self-pay | Admitting: *Deleted

## 2020-09-23 ENCOUNTER — Ambulatory Visit (HOSPITAL_COMMUNITY)
Admission: RE | Admit: 2020-09-23 | Discharge: 2020-09-23 | Disposition: A | Discharge status: Home / Self Care | Payer: PPO | Source: Ambulatory Visit | Attending: Internal Medicine | Admitting: Internal Medicine

## 2020-09-23 DIAGNOSIS — M7989 Other specified soft tissue disorders: Secondary | ICD-10-CM

## 2020-09-30 DIAGNOSIS — M1711 Unilateral primary osteoarthritis, right knee: Secondary | ICD-10-CM | POA: Diagnosis not present

## 2020-10-06 DIAGNOSIS — M1711 Unilateral primary osteoarthritis, right knee: Secondary | ICD-10-CM | POA: Diagnosis not present

## 2020-11-01 NOTE — H&P (Signed)
TOTAL KNEE ADMISSION H&P  Patient is being admitted for right total knee arthroplasty.  Subjective:  Chief Complaint: Right knee pain.  HPI: Catherine [name], 75 y.o. female has a history of pain and functional disability in the right knee due to arthritis and has failed non-surgical conservative treatments for greater than 12 weeks to include corticosteriod injections, viscosupplementation injections, and activity modification. Onset of symptoms was gradual, starting  several  years ago with gradually worsening course since that time. The patient noted no past surgery on the right knee.  Patient currently rates pain in the right knee at 8 out of 10 with activity. Patient has night pain, worsening of pain with activity and weight bearing, pain that interferes with activities of daily living, and crepitus. Patient has evidence of  bone-on-bone arthritis in the patellofemoral compartment with some erosion of the patella. She has significant medial narrowing  by imaging studies. There is no active infection.  Patient Active Problem List   Diagnosis Date Noted   Paroxysmal atrial fibrillation ([place]) 06/29/2020   Painful orthopaedic hardware ([place]) 06/17/2018   Lumbar pseudoarthrosis 01/23/2017   Abnormal ECG 12/24/2014   Spasmodic dysphonia    Difficult intubation    Insomnia    Abnormal glandular Papanicolaou smear of cervix 11/04/2014   Spondylolysis, lumbosacral 08/13/2014   Herniated nucleus pulposus, L5-S1 08/10/2014   Arthritis 04/18/2012   CIN I (cervical intraepithelial neoplasia I)    Fever blister     Past Medical History:  Diagnosis Date   Abnormal glandular Papanicolaou smear of cervix 11/04/2014   Arthritis    Back pain    CIN I (cervical intraepithelial neoplasia I)    LEEP 2006 margins free      negative HR HPV 2008    Difficult intubation    needs pediatric equipment   Fever blister    Herniated nucleus pulposus, L5-S1 08/10/2014   Insomnia    Neck pain     Pseudoarthrosis of lumbar spine    Spasmodic dysphonia    Spondylolysis, lumbosacral 08/13/2014   Spondylitic Stenosis    Past Surgical History:  Procedure Laterality Date   ABDOMINAL EXPOSURE N/A 01/23/2017   Procedure: ABDOMINAL EXPOSURE;  Surgeon: Rosetta Posner, MD;  Location: [facility];  Service: Vascular;  Laterality: N/A;   ANTERIOR LUMBAR FUSION N/A 01/23/2017   Procedure: Revision of Lumbar five-Sacral One Fusion with Anterior lumbar interbody fusion, Dr. Sherren Mocha Early to co surgeon;  Surgeon: Kristeen Miss, MD;  Location: [facility];  Service: Neurosurgery;  Laterality: N/A;   BACK SURGERY     Fusion   CATARACT EXTRACTION W/ INTRAOCULAR LENS  IMPLANT, BILATERAL     CERVICAL BIOPSY  W/ LOOP ELECTRODE EXCISION  2006   CERVICAL CONIZATION W/BX N/A 12/28/2014   Procedure: CONIZATION CERVIX WITH BIOPSY;  Surgeon: Terrance Mass, MD;  Location: [facility];  Service: Gynecology;  Laterality: N/A;   COLONOSCOPY     COLPOSCOPY     DILATION AND CURETTAGE OF UTERUS     HARDWARE REMOVAL N/A 06/17/2018   Procedure: Removal of bilateral iliac fixation;  Surgeon: Kristeen Miss, MD;  Location: [facility];  Service: Neurosurgery;  Laterality: N/A;  Removal of bilateral iliac fixation   HYSTEROSCOPY WITH D & C N/A 12/28/2014   Procedure: DILATATION AND CURETTAGE /HYSTEROSCOPY Diagnostic Hysteroscopy;  Surgeon: Terrance Mass, MD;  Location: [facility];  Service: Gynecology;  Laterality: N/A;   JOINT REPLACEMENT     Left knee   KNEE SURGERY  Rt-95,Lft.-98,Replacement-09   LUMBAR LAMINECTOMY/DECOMPRESSION MICRODISCECTOMY Bilateral 08/10/2014   Procedure: Bilateral Lumbar five-Sacral one Diskectomy;  Surgeon: Kristeen Miss, MD;  Location: [facility];  Service: Neurosurgery;  Laterality: Bilateral;  Bilateral L5-S1 Diskectomy   ROTATOR CUFF REPAIR  2004   TONSILLECTOMY     TUBAL LIGATION      Prior to Admission medications   Medication Sig Start Date End Date Taking? Authorizing Provider  celecoxib (CELEBREX) 200 MG  capsule Take 1 capsule (200 mg total) by mouth in the morning. 09/02/20   [provider]  Cholecalciferol (VITAMIN D3) 125 MCG (5000 UT) CAPS Take 5,000 Units by mouth daily.    [provider]  diazepam (VALIUM) 2 MG tablet take 1 tablet by oral route 2 times every day as needed for muscle spasm 04/28/20   [provider]  diltiazem (CARDIZEM) 30 MG tablet Take 1 tablet every 4 hours AS NEEDED for HR >100 as long as top BP >100. 07/05/20   Fenton, Clint R, PA  HYDROcodone-acetaminophen (NORCO/VICODIN) 5-325 MG tablet Take 1 tablet by mouth every 4 (four) hours as needed for moderate pain. 06/17/18   Kristeen Miss, MD  loratadine (CLARITIN) 10 MG tablet Take 10 mg by mouth daily as needed for allergies.    [provider]  Polyethyl Glycol-Propyl Glycol (SYSTANE OP) Place 1 drop into both eyes daily.    [provider]  Probiotic Product (ALIGN) 4 MG CAPS Take 4 mg by mouth daily.    [provider]  vitamin B-12 (CYANOCOBALAMIN) 1000 MCG tablet Take 1,000 mcg by mouth daily.    [provider]  zolpidem (AMBIEN) 10 MG tablet Take 10 mg by mouth at bedtime.  06/24/14   [provider]    Allergies  Allergen Reactions   Oxycodone Other (See Comments)    Hallucinations    Social History   Socioeconomic History   Marital status: Married    Spouse name: Not on file   Number of children: Not on file   Years of education: Not on file   Highest education level: Not on file  Occupational History   Not on file  Tobacco Use   Smoking status: Never   Smokeless tobacco: Never  Vaping Use   Vaping Use: Never used  Substance and Sexual Activity   Alcohol use: Not Currently    Alcohol/week: 14.0 standard drinks    Types: 14 Standard drinks or equivalent per week    Comment: daily drink   Drug use: No   Sexual activity: Yes    Birth control/protection: Post-menopausal, Surgical    Comment: INSURANCE QUESTIONS DECLINED  Other  Topics Concern   Not on file  Social History Narrative   Not on file   Social Determinants of Health   Financial Resource Strain: Not on file  Food Insecurity: Not on file  Transportation Needs: Not on file  Physical Activity: Not on file  Stress: Not on file  Social Connections: Not on file  Intimate Partner Violence: Not on file    Tobacco Use: Low Risk    Smoking Tobacco Use: Never   Smokeless Tobacco Use: Never   Social History   Substance and Sexual Activity  Alcohol Use Not Currently   Alcohol/week: 14.0 standard drinks   Types: 14 Standard drinks or equivalent per week   Comment: daily drink    Family History  Problem Relation Age of Onset   Hypertension Mother    Dementia Mother    Hypertension  Father    Diabetes Father    Heart disease Father    Heart failure Father    Parkinson's disease Brother    Breast cancer Neg Hx     Review of Systems  Constitutional:  Negative for chills and fever.  HENT:  Negative for congestion, sore throat and tinnitus.   Eyes:  Negative for double vision, photophobia and pain.  Respiratory:  Negative for cough, shortness of breath and wheezing.   Cardiovascular:  Negative for chest pain, palpitations and orthopnea.  Gastrointestinal:  Negative for heartburn, nausea and vomiting.  Genitourinary:  Negative for dysuria, frequency and urgency.  Musculoskeletal:  Positive for joint pain.  Neurological:  Negative for dizziness, weakness and headaches.   Objective:  Physical Exam: Well nourished and well developed.  General: Alert and oriented x3, cooperative and pleasant, no acute distress.  Head: normocephalic, atraumatic, neck supple.  Eyes: EOMI.  Respiratory: breath sounds clear in all fields, no wheezing, rales, or rhonchi. Cardiovascular: Regular rate and rhythm, no murmurs, gallops or rubs.  Abdomen: non-tender to palpation and soft, normoactive bowel sounds. Musculoskeletal:   Right Knee Exam:   No  intra-articular effusion present. No swelling present.   Palpable Baker's cyst.   The range of motion is: 0 to 115 or 120 degrees.   No crepitus on range of motion of the knee.   Positive medial joint line tenderness.   No lateral joint line tenderness.   The knee is stable.   Calves soft and nontender. Motor function intact in LE. Strength 5/5 LE bilaterally. Neuro: Distal pulses 2+. Sensation to light touch intact in LE.   Imaging Review Plain radiographs demonstrate severe degenerative joint disease of the right knee. The overall alignment is neutral. The bone quality appears to be adequate for age and reported activity level.  Assessment/Plan:  End stage arthritis, right knee   The patient history, physical examination, clinical judgment of the provider and imaging studies are consistent with end stage degenerative joint disease of the right knee and total knee arthroplasty is deemed medically necessary. The treatment options including medical management, injection therapy arthroscopy and arthroplasty were discussed at length. The risks and benefits of total knee arthroplasty were presented and reviewed. The risks due to aseptic loosening, infection, stiffness, patella tracking problems, thromboembolic complications and other imponderables were discussed. The patient acknowledged the explanation, agreed to proceed with the plan and consent was signed. Patient is being admitted for inpatient treatment for surgery, pain control, PT, OT, prophylactic antibiotics, VTE prophylaxis, progressive ambulation and ADLs and discharge planning. The patient is planning to be discharged  home .   Patient's anticipated LOS is less than 2 midnights, meeting these requirements: - Lives within 1 hour of care - Has a competent adult at home to recover with post-op recover - NO history of  - Chronic pain requiring opioids  - Diabetes  - Coronary Artery Disease  - Heart failure  - Heart attack  -  Stroke  - DVT/VTE  - Cardiac arrhythmia  - Respiratory Failure/COPD  - Renal failure  - Anemia  - Advanced Liver disease  Therapy Plans: Outpatient therapy at [facility] Disposition: Home with husband Planned DVT Prophylaxis: Eliquis 5 mg BID DME Needed: None PCP: Marton Redwood, MD Cardiologist: Malka So, PA-C (clearance received) TXA: IV Allergies: Oxycodone (hallucinations) Anesthesia Concerns: Lumbar fusion L2-S1; small airway if intubation required BMI: 19.7 Last HgbA1c: Not diabetic.  Pharmacy: [facility] [phone] [place])  Other: - Atrial fibrillation - Norco 5-325 mg  QD. She would prefer to increase dosage of Norco versus dilaudid. Will switch to dilaudid if not achieving adequate pain control.  - Contacting Dr. Raul Del office today regarding clearance  - Patient was instructed on what medications to stop prior to surgery. - Follow-up visit in 2 weeks with Dr. Wynelle Link - Begin physical therapy following surgery - Pre-operative lab work as pre-surgical testing - Prescriptions will be provided in hospital at time of discharge  Theresa Duty, PA-C Orthopedic Surgery [facility] [place]

## 2020-11-04 DIAGNOSIS — J385 Laryngeal spasm: Secondary | ICD-10-CM | POA: Diagnosis not present

## 2020-11-16 NOTE — Progress Notes (Addendum)
Anesthesia Review:  PCP: DR Marton Redwood  Cardiologist :followed by afib clinic Clint Fenton,PA [place] 07/05/20  Chest x-ray : EKG : 07/29/20  Echo :07/28/20  Stress test: Cardiac Cath :  Activity level: cannot do a flight of stairs without difficulty  Sleep Study/ CPAP : none  Fasting Blood Sugar :      / Checks Blood Sugar -- times a day:   Blood Thinner/ Instructions /Last Dose: ASA / Instructions/ Last Dose :   eLIQUIS- pt to call surgeon and/or cardiology regarding prep instructions.   Covid tst on 11/25/2020 ( going out of town and requested if covid test could be done on 11/25/2020.   Pt has hx lower back surgery.  Rquested Dr Suann Larry for anesthesia.  Also with hx of pediatric tube for intubation  ( Wife of retired Dr Tinnie Gens)

## 2020-11-16 NOTE — Progress Notes (Signed)
DUE TO COVID-19 ONLY ONE VISITOR IS ALLOWED TO COME WITH YOU AND STAY IN THE WAITING ROOM ONLY DURING PRE OP AND PROCEDURE DAY OF SURGERY.  2 VISITOR  MAY VISIT WITH YOU AFTER SURGERY IN YOUR PRIVATE ROOM DURING VISITING HOURS ONLY!  YOU NEED TO HAVE A COVID 19 TEST ON__11/03/2020 _ @_from  8am-3pm _____, THIS TEST MUST BE DONE BEFORE SURGERY,  Covid test is done at [place], [place] [address].  This is a drive thru.  No appt required. Please see map.                 Your procedure is scheduled on:    11/28/2020   Report to [facility] Main  Entrance   Report to admitting at  0545  AM     Call this number if you have problems the morning of surgery [phone]    REMEMBER: NO  SOLID FOOD CANDY OR GUM AFTER MIDNIGHT. CLEAR LIQUIDS UNTIL    0520AM      . NOTHING BY MOUTH EXCEPT CLEAR LIQUIDS UNTIL  0520AM   . PLEASE FINISH ENSURE DRINK PER SURGEON ORDER  WHICH NEEDS TO BE COMPLETED AT    [id number]   .      CLEAR LIQUID DIET   Foods Allowed                                                                    Coffee and tea, regular and decaf                            Fruit ices (not with fruit pulp)                                      Iced Popsicles                                    Carbonated beverages, regular and diet                                    Cranberry, grape and apple juices Sports drinks like Gatorade Lightly seasoned clear broth or consume(fat free) Sugar, honey syrup ___________________________________________________________________      BRUSH YOUR TEETH MORNING OF SURGERY AND RINSE YOUR MOUTH OUT, NO CHEWING GUM CANDY OR MINTS.     Take these medicines the morning of surgery with A SIP OF WATER:  CARDIZEM IF NEEDED, CLARITIN IF NEEDED, EYE DROPS AS USUAL   DO NOT TAKE ANY DIABETIC MEDICATIONS DAY OF YOUR SURGERY                               You may not have any metal on your body including hair pins and              piercings  Do not  wear jewelry, make-up, lotions, powders or perfumes, deodorant  Do not wear nail polish on your fingernails.  Do not shave  48 hours prior to surgery.              Men may shave face and neck.   Do not bring valuables to the hospital. [place].  Contacts, dentures or bridgework may not be worn into surgery.  Leave suitcase in the car. After surgery it may be brought to your room.     Patients discharged the day of surgery will not be allowed to drive home. IF YOU ARE HAVING SURGERY AND GOING HOME THE SAME DAY, YOU MUST HAVE AN ADULT TO DRIVE YOU HOME AND BE WITH YOU FOR 24 HOURS. YOU MAY GO HOME BY TAXI OR UBER OR ORTHERWISE, BUT AN ADULT MUST ACCOMPANY YOU HOME AND STAY WITH YOU FOR 24 HOURS.  Name and phone number of your driver:  Special Instructions: N/A              Please read over the following fact sheets you were given: _____________________________________________________________________  [facility] - Preparing for Surgery Before surgery, you can play an important role.  Because skin is not sterile, your skin needs to be as free of germs as possible.  You can reduce the number of germs on your skin by washing with CHG (chlorahexidine gluconate) soap before surgery.  CHG is an antiseptic cleaner which kills germs and bonds with the skin to continue killing germs even after washing. Please DO NOT use if you have an allergy to CHG or antibacterial soaps.  If your skin becomes reddened/irritated stop using the CHG and inform your nurse when you arrive at Short Stay. Do not shave (including legs and underarms) for at least 48 hours prior to the first CHG shower.  You may shave your face/neck. Please follow these instructions carefully:  1.  Shower with CHG Soap the night before surgery and the  morning of Surgery.  2.  If you choose to wash your hair, wash your hair first as usual with your  normal  shampoo.  3.  After you  shampoo, rinse your hair and body thoroughly to remove the  shampoo.                           4.  Use CHG as you would any other liquid soap.  You can apply chg directly  to the skin and wash                       Gently with a scrungie or clean washcloth.  5.  Apply the CHG Soap to your body ONLY FROM THE NECK DOWN.   Do not use on face/ open                           Wound or open sores. Avoid contact with eyes, ears mouth and genitals (private parts).                       Wash face,  Genitals (private parts) with your normal soap.             6.  Wash thoroughly, paying special attention to the area where your surgery  will be performed.  7.  Thoroughly rinse your body with  warm water from the neck down.  8.  DO NOT shower/wash with your normal soap after using and rinsing off  the CHG Soap.                9.  Pat yourself dry with a clean towel.            10.  Wear clean pajamas.            11.  Place clean sheets on your bed the night of your first shower and do not  sleep with pets. Day of Surgery : Do not apply any lotions/deodorants the morning of surgery.  Please wear clean clothes to the hospital/surgery center.  FAILURE TO FOLLOW THESE INSTRUCTIONS MAY RESULT IN THE CANCELLATION OF YOUR SURGERY PATIENT SIGNATURE_________________________________  NURSE SIGNATURE__________________________________  ________________________________________________________________________

## 2020-11-18 ENCOUNTER — Encounter (HOSPITAL_COMMUNITY): Payer: Self-pay

## 2020-11-18 ENCOUNTER — Telehealth (HOSPITAL_COMMUNITY): Payer: Self-pay

## 2020-11-18 ENCOUNTER — Other Ambulatory Visit: Payer: Self-pay

## 2020-11-18 ENCOUNTER — Encounter (HOSPITAL_COMMUNITY)
Admission: RE | Admit: 2020-11-18 | Discharge: 2020-11-18 | Disposition: A | Discharge status: Home / Self Care | Payer: PPO | Source: Ambulatory Visit | Attending: Orthopedic Surgery | Admitting: Orthopedic Surgery

## 2020-11-18 VITALS — BP 102/72 | HR 81 | Temp 98.0°F | Resp 16 | Ht 61.5 in | Wt 106.0 lb

## 2020-11-18 DIAGNOSIS — M1711 Unilateral primary osteoarthritis, right knee: Secondary | ICD-10-CM | POA: Diagnosis not present

## 2020-11-18 DIAGNOSIS — Z01812 Encounter for preprocedural laboratory examination: Secondary | ICD-10-CM | POA: Insufficient documentation

## 2020-11-18 DIAGNOSIS — I4891 Unspecified atrial fibrillation: Secondary | ICD-10-CM | POA: Insufficient documentation

## 2020-11-18 DIAGNOSIS — Z79899 Other long term (current) drug therapy: Secondary | ICD-10-CM | POA: Insufficient documentation

## 2020-11-18 DIAGNOSIS — Z7901 Long term (current) use of anticoagulants: Secondary | ICD-10-CM | POA: Diagnosis not present

## 2020-11-18 DIAGNOSIS — Z01818 Encounter for other preprocedural examination: Secondary | ICD-10-CM

## 2020-11-18 HISTORY — DX: Cardiac arrhythmia, unspecified: I49.9

## 2020-11-18 HISTORY — DX: Other complications of anesthesia, initial encounter: T88.59XA

## 2020-11-18 LAB — COMPREHENSIVE METABOLIC PANEL
ALT: 11 U/L (ref 0–44)
AST: 22 U/L (ref 15–41)
Albumin: 4.2 g/dL (ref 3.5–5.0)
Alkaline Phosphatase: 68 U/L (ref 38–126)
Anion gap: 9 (ref 5–15)
BUN: 23 mg/dL (ref 8–23)
CO2: 26 mmol/L (ref 22–32)
Calcium: 9.6 mg/dL (ref 8.9–10.3)
Chloride: 100 mmol/L (ref 98–111)
Creatinine, Ser: 0.47 mg/dL (ref 0.44–1.00)
GFR, Estimated: >60 mL/min (ref >60)
Glucose, Bld: 106 mg/dL — ABNORMAL HIGH (ref 70–99)
Potassium: 4.9 mmol/L (ref 3.5–5.1)
Sodium: 135 mmol/L (ref 135–145)
Total Bilirubin: 0.7 mg/dL (ref 0.3–1.2)
Total Protein: 6.7 g/dL (ref 6.5–8.1)

## 2020-11-18 LAB — CBC
HCT: 44 % (ref 36.0–46.0)
Hemoglobin: 14.6 g/dL (ref 12.0–15.0)
MCH: 31.5 pg (ref 26.0–34.0)
MCHC: 33.2 g/dL (ref 30.0–36.0)
MCV: 94.8 fL (ref 80.0–100.0)
Platelets: 316 10*3/uL (ref 150–400)
RBC: 4.64 MIL/uL (ref 3.87–5.11)
RDW: 11.8 % (ref 11.5–15.5)
WBC: 7.6 10*3/uL (ref 4.0–10.5)
nRBC: 0 % (ref 0.0–0.2)

## 2020-11-18 LAB — PROTIME-INR
INR: 1.1 (ref 0.8–1.2)
Prothrombin Time: 14.6 seconds (ref 11.4–15.2)

## 2020-11-18 LAB — SURGICAL PCR SCREEN
MRSA, PCR: NEGATIVE
Staphylococcus aureus: NEGATIVE

## 2020-11-18 NOTE — Telephone Encounter (Signed)
Patient called and states she went back on her Eliquis medication on 10/3. She is scheduled to have knee surgery performed by Dr. Wynelle Link on November 7th. She wanted to know how long she would need to be off of her Eliquis prior to her procedure. Advised patient to have Dr. Anne Fu clinic fax over a cardiac clearance regarding her Eliquis medication. Her A-fib has been much better. She has only had one episode on 10/3. Patient was out of town states she indulged in a chocolate dessert and hiked to an area at a higher elevation around 6,000 feet when her heart rate started racing ranging from 125-140. Consulted with patient and she verbalized understanding.

## 2020-11-21 IMAGING — CT CT PELVIS WITHOUT CONTRAST
3 series · 14 of 33 positions shown, 17 images · non-contrast
Comparison: CT lumbar spine reported separately.

CLINICAL DATA: Back pain.

EXAM:
CT PELVIS WITHOUT CONTRAST
TECHNIQUE: Multidetector CT imaging of the pelvis was performed following the
standard protocol without intravenous contrast.

[Series 4: axial st · axial · 0.72mm/px · z∈[+1001,+1161]mm · 6 of 106 slices shown, 8 images]
[im 17/106  soft-tissue]
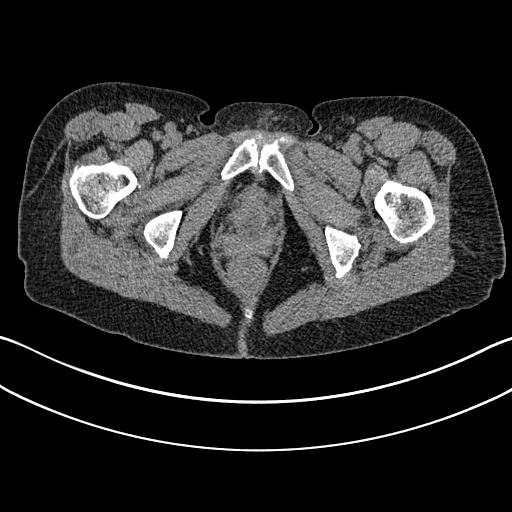
[im 17/106  bone]
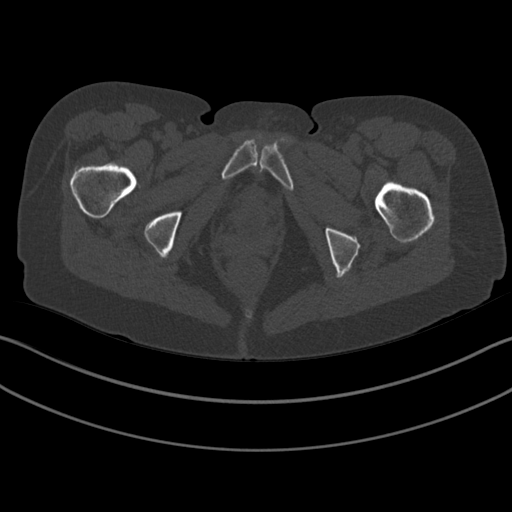
[im 33/106  bone]
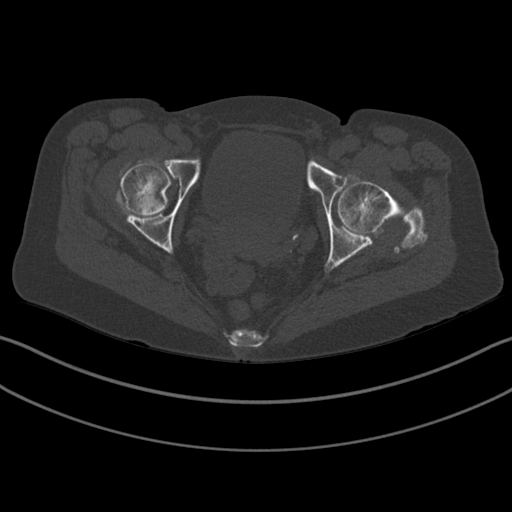
[im 49/106  bone]
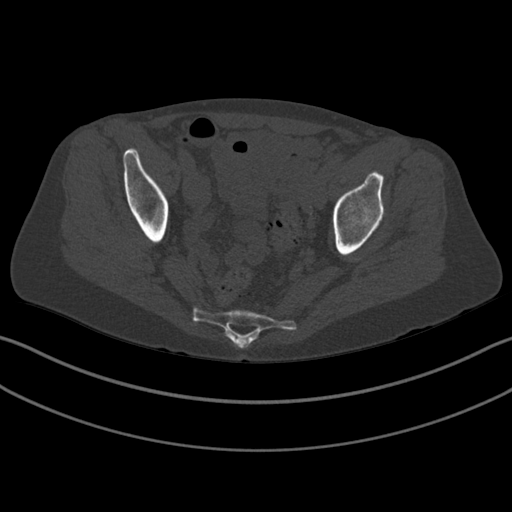
[im 65/106  bone]
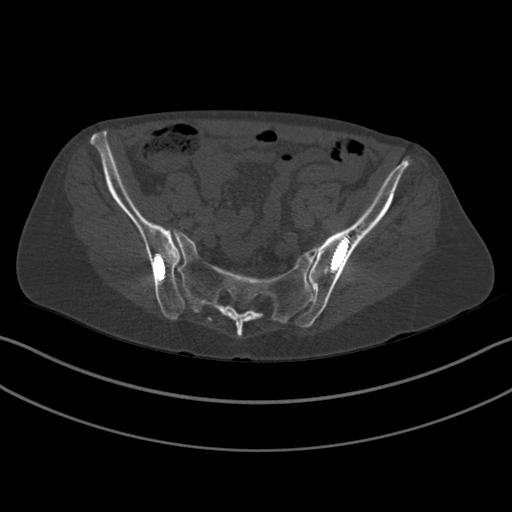
[im 81/106  soft-tissue]
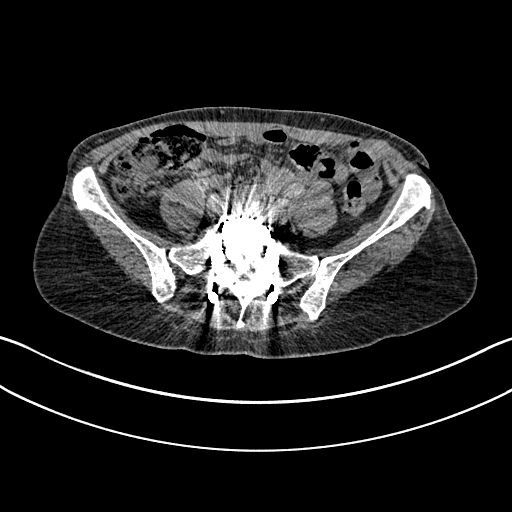
[im 81/106  bone]
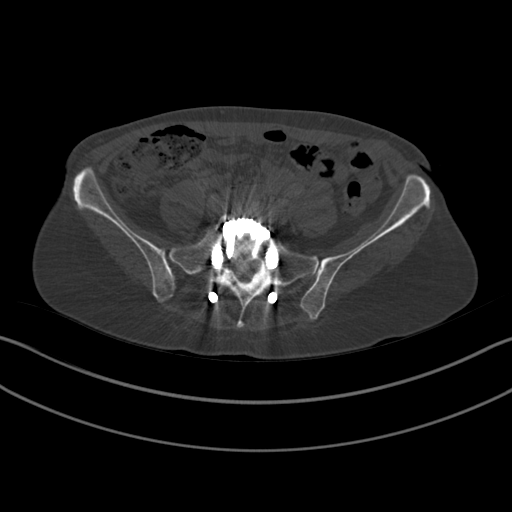
[im 97/106  bone]
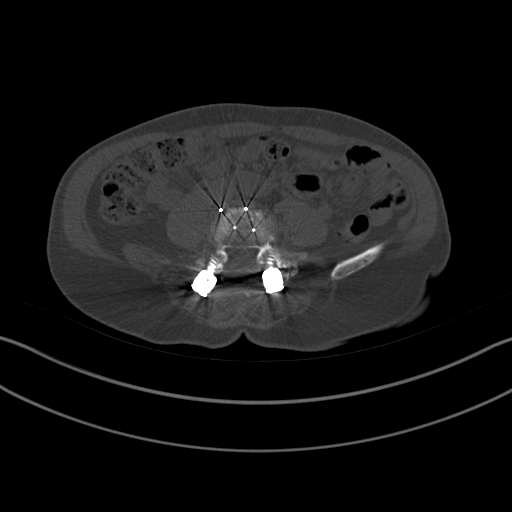

[Series 7: coronal st · coronal · 0.48mm/px · 3 of 94 slices shown]
[im 19/94  bone]
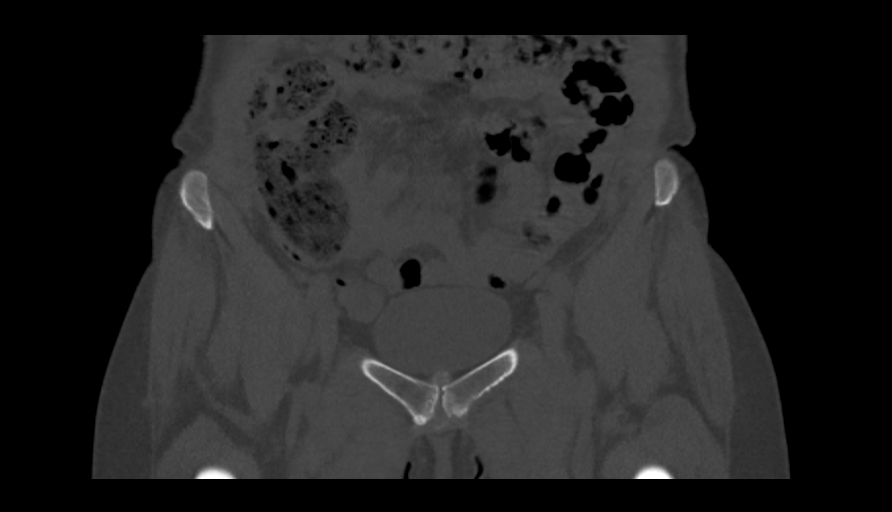
[im 38/94  bone]
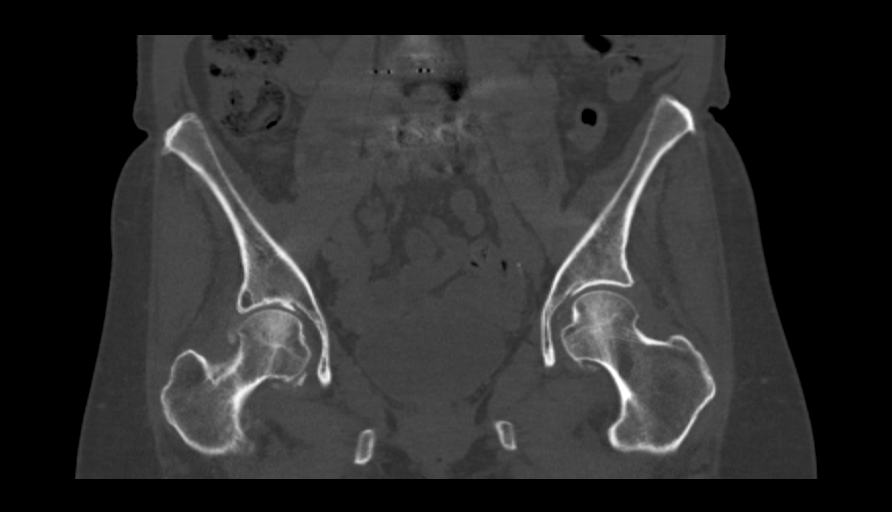
[im 56/94  bone]
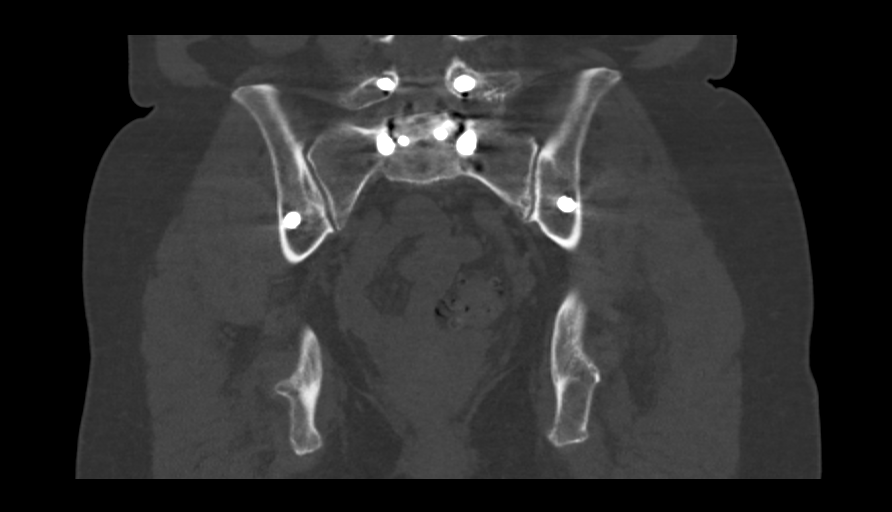

[Series 8: sagittal st · sagittal · 0.55mm/px · 5 of 97 slices shown, 6 images]
[im 33/97  bone]
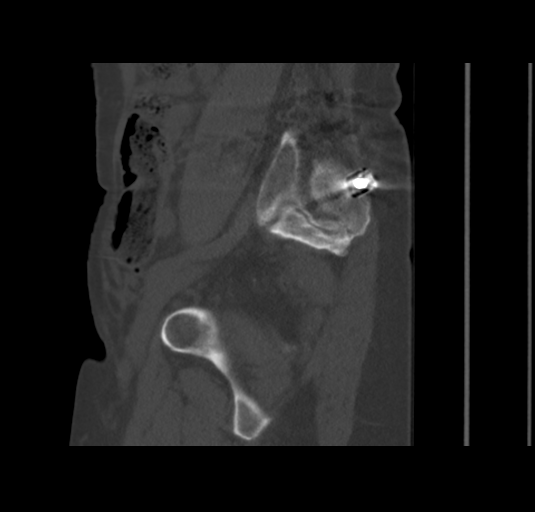
[im 41/97  bone]
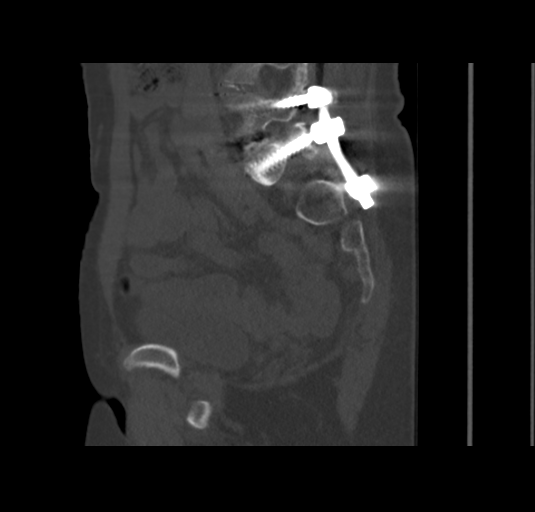
[im 49/97  soft-tissue]
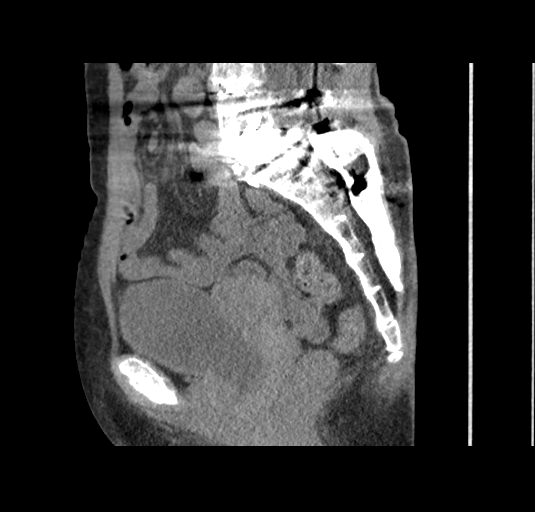
[im 49/97  bone]
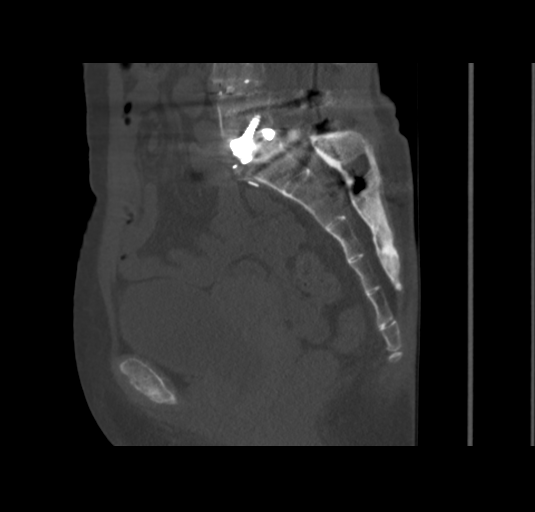
[im 57/97  bone]
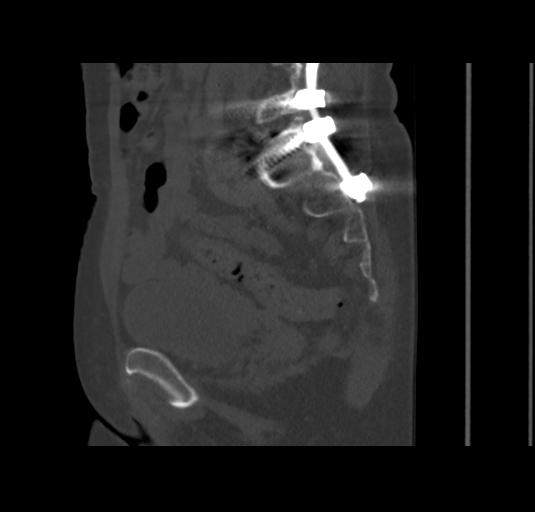
[im 65/97  bone]
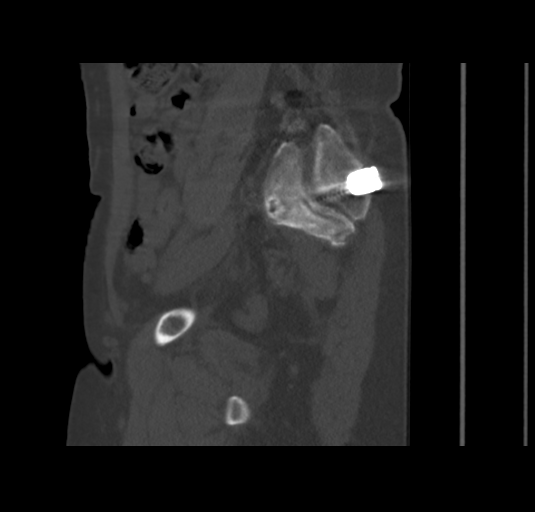

[14 of 33 positions shown; findings below may reference images not displayed]

FINDINGS: Urinary Tract:  No abnormality visualized.

Bowel:  Unremarkable visualized pelvic bowel loops.

Vascular/Lymphatic: No pathologically enlarged lymph nodes. No
significant vascular abnormality seen.

Reproductive:  No mass or other significant abnormality

Other:  None.

Musculoskeletal: BILATERAL iliac screws articulate with lumbosacral
fusion construct, with no adverse features. Unremarkable SI joints.
Incidental osteitis pubis.
IMPRESSION: Unremarkable appearing lumbosacral fusion construct.

## 2020-11-21 NOTE — Progress Notes (Addendum)
Anesthesia Chart Review   Case: [id number] Date/Time: 11/28/20 0805   Procedure: TOTAL KNEE ARTHROPLASTY (Right: Knee)   Anesthesia type: Choice   Pre-op diagnosis: right knee osteoarthritis   Location: [facility] ROOM 10 / [facility]   Surgeons: Gaynelle Arabian, MD       DISCUSSION:75 y.o. never smoker with h/o atrial fibrillation, right knee OA scheduled for above procedure 11/28/2020 with Dr. Gaynelle Arabian.  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 Posterior lateral arthrodesis with infuse and allograft L2-3 and L5-S1 01/23/17).   H/o difficult intubation, pt reports need for pediatric ETT tube due to spasmodic dysphonia.    Per previous anesthesia chart review:   DATE ANESTHESIA RECORDS  01/23/17 Difficulty was anticipated; Grade 1; Self; Endotracheal Tube; MAC, 3; Oral; 6 mm; Cuffed, Min.occ.pres., Air; 1; Stage manager, ETCO2 (Capnography), Bilateral Breath Sounds, Chest Rise; 22 cm  08/13/14 Intubation Type: IV induction Ventilation: Mask ventilation without difficulty Laryngoscope Size: Mac and 3 Grade View: Grade I Tube type: Oral Tube size: 6.0 mm Number of attempts: 1 Airway Equipment and Method: Stylet Comments: Easy atraumatic induction and intubation with MAC 3 blade.  Dr. Oletta Lamas verified placement before and after positioning prone.   08/10/14 Intubation Type: IV induction Ventilation: Mask ventilation without difficulty Grade View: Grade  I Tube type: Oral Tube size: 6.0 mm Number of attempts: 1 Airway Equipment and Method: Stylet and LTA kit utilized  06/09/09 Anesthesia consult 06/03/09 regarding history of spasmodic dysphonia. Plan for smaller ETT given vocal cord issues. On 06/09/09, Miller 2 used to place 6.5 ETT. Grade I view.     S/p iliac fixation hardware removal 06/17/2018, Laryngoscope Size: Mac and 3, Grade I view, 6.0 mm tube used with 1 attempt.   Pt cleared by a-fib clinic and advised to hold Eliquis for 2 days prior to procedure. Currently anesthesia type is choice.  I advised surgeons office pt will need to hold for 3 days for spinal.    Pt has requested Dr. Therisa Doyne for case.  Will discuss with anesthesia.   Anticipate pt can proceed with planned procedure barring acute status change.   VS: BP 102/72   Pulse 81   Temp 36.7 C (Oral)   Resp 16   Ht 5' 1.5" (1.562 m)   Wt 48.1 kg   SpO2 98%   BMI 19.70 kg/m   PROVIDERS: Ginger Organ., MD is PCP    LABS: Labs reviewed: Acceptable for surgery. (all labs ordered are listed, but only abnormal results are displayed)  Labs Reviewed  COMPREHENSIVE METABOLIC PANEL - Abnormal; Notable for the following components:      Result Value   Glucose, Bld 106 (*)    All other components within normal limits  SURGICAL PCR SCREEN  CBC  PROTIME-INR     IMAGES:   EKG: 07/29/2020 Rate 61 bpm  Normal sinus rhythm Low voltage QRS Septal infarct , age undetermined Abnormal ECG No significant change since last tracing  CV: Echo 07/28/2020  1. Left ventricular ejection fraction, by estimation, is 60 to 65%. The  left ventricle has normal function.  The left ventricle has no regional  wall motion abnormalities. Left ventricular diastolic parameters were  normal.   2. Right ventricular systolic function is normal. The right ventricular  size is normal. There is normal pulmonary artery systolic pressure. The  estimated right ventricular systolic  pressure is 47.6 mmHg.   3. The mitral valve is normal in structure. Trivial mitral valve  regurgitation.   4. The aortic valve was not well visualized. Aortic valve regurgitation  is not visualized. No aortic stenosis is present.   5. The inferior vena cava is dilated in size with >50% respiratory  variability, suggesting right atrial pressure of 8 mmHg.  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 Sherren Mocha Early to co surgeon;  Surgeon: Kristeen Miss, MD;  Location: [place];  Service: Neurosurgery;  Laterality: N/A;   BACK SURGERY     Fusion   CATARACT EXTRACTION W/ INTRAOCULAR LENS  IMPLANT, BILATERAL     CERVICAL BIOPSY  W/ LOOP ELECTRODE EXCISION  2006   CERVICAL CONIZATION W/BX N/A 12/28/2014   Procedure: CONIZATION CERVIX WITH BIOPSY;  Surgeon: Terrance Mass, MD;  Location: [place] [facility];  Service: Gynecology;  Laterality: N/A;   COLONOSCOPY     COLPOSCOPY     DILATION AND CURETTAGE OF UTERUS     HARDWARE REMOVAL N/A 06/17/2018   Procedure: Removal of bilateral iliac fixation;  Surgeon: Kristeen Miss, MD;  Location: [place];  Service: Neurosurgery;   Laterality: N/A;  Removal of bilateral iliac fixation   HYSTEROSCOPY WITH D & C N/A 12/28/2014   Procedure: DILATATION AND CURETTAGE /HYSTEROSCOPY Diagnostic Hysteroscopy;  Surgeon: Terrance Mass, MD;  Location: [place] [facility];  Service: Gynecology;  Laterality: N/A;   JOINT REPLACEMENT     Left knee   KNEE SURGERY     Rt-95,Lft.-98,Replacement-09   LUMBAR LAMINECTOMY/DECOMPRESSION MICRODISCECTOMY Bilateral 08/10/2014   Procedure: Bilateral Lumbar five-Sacral one Diskectomy;  Surgeon: Kristeen Miss, MD;  Location: [place] [facility];  Service: Neurosurgery;  Laterality: Bilateral;  Bilateral L5-S1 Diskectomy   ROTATOR CUFF REPAIR  2004   TONSILLECTOMY     TUBAL LIGATION      MEDICATIONS:  acetaminophen (TYLENOL) 500 MG tablet   celecoxib (CELEBREX) 200 MG capsule   cholecalciferol (VITAMIN D) 25 MCG (1000 UNIT) tablet   diazepam (VALIUM) 2 MG tablet   diltiazem (CARDIZEM) 30 MG tablet   ELIQUIS 5 MG TABS tablet   HYDROcodone-acetaminophen (NORCO/VICODIN) 5-325 MG tablet   loratadine (CLARITIN) 10 MG tablet   Polyethyl Glycol-Propyl Glycol (SYSTANE OP)   Probiotic  Product (ALIGN) 4 MG CAPS   vitamin B-12 (CYANOCOBALAMIN) 1000 MCG tablet   zolpidem (AMBIEN) 10 MG tablet   No current facility-administered medications for this encounter.     Konrad Felix Ward, PA-C [facility] Pre-Surgical Testing [phone]

## 2020-11-24 ENCOUNTER — Other Ambulatory Visit (HOSPITAL_COMMUNITY): Payer: PPO

## 2020-11-25 ENCOUNTER — Other Ambulatory Visit: Payer: Self-pay | Admitting: Orthopedic Surgery

## 2020-11-25 LAB — SARS CORONAVIRUS 2 (TAT 6-24 HRS): SARS Coronavirus 2: NEGATIVE

## 2020-11-25 NOTE — Anesthesia Preprocedure Evaluation (Addendum)
Anesthesia Evaluation  Patient identified by MRN, date of birth, ID band Patient awake    Reviewed: Allergy & Precautions, H&P , NPO status , Patient's Chart, lab work & pertinent test results  History of Anesthesia Complications (+) DIFFICULT AIRWAY  Airway Mallampati: II  TM Distance: >3 FB Neck ROM: Full  Mouth opening: Limited Mouth Opening  Dental no notable dental hx. (+) Teeth Intact, Dental Advisory Given   Pulmonary neg pulmonary ROS,    Pulmonary exam normal breath sounds clear to auscultation       Cardiovascular Exercise Tolerance: Good + dysrhythmias Atrial Fibrillation  Rhythm:Regular Rate:Normal     Neuro/Psych negative neurological ROS  negative psych ROS   GI/Hepatic negative GI ROS, Neg liver ROS,   Endo/Other  negative endocrine ROS  Renal/GU negative Renal ROS  negative genitourinary   Musculoskeletal  (+) Arthritis , Osteoarthritis,    Abdominal   Peds  Hematology negative hematology ROS (+)   Anesthesia Other Findings   Reproductive/Obstetrics negative OB ROS                            Anesthesia Physical Anesthesia Plan  ASA: 2  Anesthesia Plan: Spinal   Post-op Pain Management:  Regional for Post-op pain   Induction: Intravenous  PONV Risk Score and Plan: 3 and Ondansetron, Dexamethasone and Propofol infusion  Airway Management Planned: Simple Face Mask  Additional Equipment:   Intra-op Plan:   Post-operative Plan:   Informed Consent: I have reviewed the patients History and Physical, chart, labs and discussed the procedure including the risks, benefits and alternatives for the proposed anesthesia with the patient or authorized representative who has indicated his/her understanding and acceptance.     Dental advisory given  Plan Discussed with: CRNA and Surgeon  Anesthesia Plan Comments: (See PAT note 11/18/2020, Konrad Felix Ward, PA-C)       Anesthesia Quick Evaluation

## 2020-11-28 ENCOUNTER — Encounter (HOSPITAL_COMMUNITY): Payer: Self-pay | Admitting: Orthopedic Surgery

## 2020-11-28 ENCOUNTER — Other Ambulatory Visit: Payer: Self-pay

## 2020-11-28 ENCOUNTER — Ambulatory Visit (HOSPITAL_COMMUNITY): Payer: PPO | Admitting: Anesthesiology

## 2020-11-28 ENCOUNTER — Ambulatory Visit (HOSPITAL_COMMUNITY): Payer: PPO | Admitting: Physician Assistant

## 2020-11-28 ENCOUNTER — Observation Stay (HOSPITAL_COMMUNITY)
Admission: RE | Admit: 2020-11-28 | Discharge: 2020-11-29 | Disposition: A | Discharge status: Home / Self Care | Payer: PPO | Attending: Orthopedic Surgery | Admitting: Orthopedic Surgery

## 2020-11-28 ENCOUNTER — Encounter (HOSPITAL_COMMUNITY)
Admission: RE | Disposition: A | Discharge status: Home / Self Care | Payer: Self-pay | Source: Home / Self Care | Attending: Orthopedic Surgery

## 2020-11-28 DIAGNOSIS — M179 Osteoarthritis of knee, unspecified: Secondary | ICD-10-CM | POA: Diagnosis present

## 2020-11-28 DIAGNOSIS — I480 Paroxysmal atrial fibrillation: Secondary | ICD-10-CM | POA: Insufficient documentation

## 2020-11-28 DIAGNOSIS — M1711 Unilateral primary osteoarthritis, right knee: Principal | ICD-10-CM | POA: Diagnosis present

## 2020-11-28 DIAGNOSIS — Z96652 Presence of left artificial knee joint: Secondary | ICD-10-CM | POA: Insufficient documentation

## 2020-11-28 DIAGNOSIS — G8918 Other acute postprocedural pain: Secondary | ICD-10-CM | POA: Diagnosis not present

## 2020-11-28 DIAGNOSIS — Z79899 Other long term (current) drug therapy: Secondary | ICD-10-CM | POA: Diagnosis not present

## 2020-11-28 HISTORY — PX: TOTAL KNEE ARTHROPLASTY: SHX125

## 2020-11-28 SURGERY — ARTHROPLASTY, KNEE, TOTAL
Anesthesia: Spinal | Site: Knee | Laterality: Right

## 2020-11-28 MED ORDER — SODIUM CHLORIDE (PF) 0.9 % IJ SOLN
INTRAMUSCULAR | Status: DC | PRN
  Administered 2020-11-28: 60 mL

## 2020-11-28 MED ORDER — EPHEDRINE 5 MG/ML INJ
INTRAVENOUS | Status: AC
  Filled 2020-11-28: qty 5

## 2020-11-28 MED ORDER — 0.9 % SODIUM CHLORIDE (POUR BTL) OPTIME
TOPICAL | Status: DC | PRN
  Administered 2020-11-28: 1000 mL

## 2020-11-28 MED ORDER — ACETAMINOPHEN 10 MG/ML IV SOLN
1000.0000 mg | Freq: Four times a day (QID) | INTRAVENOUS | Status: DC
  Administered 2020-11-28: 1000 mg via INTRAVENOUS
  Filled 2020-11-28: qty 100

## 2020-11-28 MED ORDER — METHOCARBAMOL 500 MG IVPB - SIMPLE MED
500.0000 mg | Freq: Four times a day (QID) | INTRAVENOUS | Status: DC | PRN
  Filled 2020-11-28: qty 50

## 2020-11-28 MED ORDER — CHLORHEXIDINE GLUCONATE CLOTH 2 % EX PADS
6.0000 | MEDICATED_PAD | Freq: Every day | CUTANEOUS | Status: DC
  Administered 2020-11-28: 6 via TOPICAL

## 2020-11-28 MED ORDER — PROPOFOL 10 MG/ML IV BOLUS
INTRAVENOUS | Status: DC | PRN
  Administered 2020-11-28: 30 mg via INTRAVENOUS
  Administered 2020-11-28 (×3): 20 mg via INTRAVENOUS
  Administered 2020-11-28: 30 mg via INTRAVENOUS

## 2020-11-28 MED ORDER — LACTATED RINGERS IV SOLN: INTRAVENOUS | Status: DC

## 2020-11-28 MED ORDER — TRAMADOL HCL 50 MG PO TABS
50.0000 mg | ORAL_TABLET | Freq: Four times a day (QID) | ORAL | Status: DC | PRN
  Administered 2020-11-28 (×2): 100 mg via ORAL
  Filled 2020-11-28 (×2): qty 2

## 2020-11-28 MED ORDER — ONDANSETRON HCL 4 MG/2ML IJ SOLN
INTRAMUSCULAR | Status: DC | PRN
  Administered 2020-11-28: 4 mg via INTRAVENOUS

## 2020-11-28 MED ORDER — PROPOFOL 10 MG/ML IV BOLUS
INTRAVENOUS | Status: AC
  Filled 2020-11-28: qty 20

## 2020-11-28 MED ORDER — PROPOFOL 500 MG/50ML IV EMUL
INTRAVENOUS | Status: DC | PRN
  Administered 2020-11-28: 50 ug/kg/min via INTRAVENOUS

## 2020-11-28 MED ORDER — GABAPENTIN 300 MG PO CAPS
300.0000 mg | ORAL_CAPSULE | Freq: Three times a day (TID) | ORAL | Status: DC
  Administered 2020-11-28 – 2020-11-29 (×4): 300 mg via ORAL
  Filled 2020-11-28 (×4): qty 1

## 2020-11-28 MED ORDER — BUPIVACAINE LIPOSOME 1.3 % IJ SUSP
20.0000 mL | Freq: Once | INTRAMUSCULAR | Status: AC
Start: 2020-11-28 — End: 2020-11-28

## 2020-11-28 MED ORDER — FENTANYL CITRATE PF 50 MCG/ML IJ SOSY
50.0000 ug | PREFILLED_SYRINGE | INTRAMUSCULAR | Status: DC
Start: 2020-11-28 — End: 2020-11-28
  Administered 2020-11-28: 50 ug via INTRAVENOUS
  Filled 2020-11-28: qty 2

## 2020-11-28 MED ORDER — PHENOL 1.4 % MT LIQD: 1.0000 | OROMUCOSAL | Status: DC | PRN

## 2020-11-28 MED ORDER — DEXAMETHASONE SODIUM PHOSPHATE 10 MG/ML IJ SOLN
INTRAMUSCULAR | Status: AC
  Filled 2020-11-28: qty 1

## 2020-11-28 MED ORDER — BUPIVACAINE IN DEXTROSE 0.75-8.25 % IT SOLN
INTRATHECAL | Status: DC | PRN
  Administered 2020-11-28: 1.6 mL via INTRATHECAL

## 2020-11-28 MED ORDER — DEXAMETHASONE SODIUM PHOSPHATE 10 MG/ML IJ SOLN
10.0000 mg | Freq: Once | INTRAMUSCULAR | Status: AC
  Administered 2020-11-29: 10 mg via INTRAVENOUS
  Filled 2020-11-28: qty 1

## 2020-11-28 MED ORDER — METOCLOPRAMIDE HCL 5 MG/ML IJ SOLN: 5.0000 mg | Freq: Three times a day (TID) | INTRAMUSCULAR | Status: DC | PRN

## 2020-11-28 MED ORDER — POVIDONE-IODINE 10 % EX SWAB
2.0000 "application " | Freq: Once | CUTANEOUS | Status: AC
  Administered 2020-11-28: 2 via TOPICAL

## 2020-11-28 MED ORDER — MIDAZOLAM HCL 2 MG/2ML IJ SOLN
INTRAMUSCULAR | Status: AC
  Administered 2020-11-28: 1 mg
  Filled 2020-11-28: qty 2

## 2020-11-28 MED ORDER — BUPIVACAINE LIPOSOME 1.3 % IJ SUSP
INTRAMUSCULAR | Status: DC | PRN
  Administered 2020-11-28: 20 mL

## 2020-11-28 MED ORDER — METOCLOPRAMIDE HCL 5 MG PO TABS: 5.0000 mg | ORAL_TABLET | Freq: Three times a day (TID) | ORAL | Status: DC | PRN

## 2020-11-28 MED ORDER — SODIUM CHLORIDE (PF) 0.9 % IJ SOLN
INTRAMUSCULAR | Status: AC
  Filled 2020-11-28: qty 60

## 2020-11-28 MED ORDER — HYDROCODONE-ACETAMINOPHEN 7.5-325 MG PO TABS
1.0000 | ORAL_TABLET | ORAL | Status: DC | PRN
  Administered 2020-11-29 (×2): 2 via ORAL
  Filled 2020-11-28 (×2): qty 2

## 2020-11-28 MED ORDER — APIXABAN 2.5 MG PO TABS
2.5000 mg | ORAL_TABLET | Freq: Two times a day (BID) | ORAL | Status: DC
  Administered 2020-11-29: 2.5 mg via ORAL
  Filled 2020-11-28: qty 1

## 2020-11-28 MED ORDER — TRANEXAMIC ACID-NACL 1000-0.7 MG/100ML-% IV SOLN
1000.0000 mg | INTRAVENOUS | Status: AC
  Administered 2020-11-28: 1000 mg via INTRAVENOUS
  Filled 2020-11-28: qty 100

## 2020-11-28 MED ORDER — MORPHINE SULFATE (PF) 2 MG/ML IV SOLN: 0.5000 mg | INTRAVENOUS | Status: DC | PRN

## 2020-11-28 MED ORDER — BISACODYL 10 MG RE SUPP: 10.0000 mg | Freq: Every day | RECTAL | Status: DC | PRN

## 2020-11-28 MED ORDER — SODIUM CHLORIDE 0.9 % IV SOLN: INTRAVENOUS | Status: DC

## 2020-11-28 MED ORDER — BUPIVACAINE-EPINEPHRINE (PF) 0.5% -1:200000 IJ SOLN
INTRAMUSCULAR | Status: DC | PRN
  Administered 2020-11-28: 20 mL via PERINEURAL

## 2020-11-28 MED ORDER — PROPOFOL 1000 MG/100ML IV EMUL
INTRAVENOUS | Status: AC
  Filled 2020-11-28: qty 100

## 2020-11-28 MED ORDER — DOCUSATE SODIUM 100 MG PO CAPS
100.0000 mg | ORAL_CAPSULE | Freq: Two times a day (BID) | ORAL | Status: DC
  Administered 2020-11-28 – 2020-11-29 (×2): 100 mg via ORAL
  Filled 2020-11-28 (×2): qty 1

## 2020-11-28 MED ORDER — POLYETHYLENE GLYCOL 3350 17 G PO PACK: 17.0000 g | PACK | Freq: Every day | ORAL | Status: DC | PRN

## 2020-11-28 MED ORDER — ONDANSETRON HCL 4 MG PO TABS: 4.0000 mg | ORAL_TABLET | Freq: Four times a day (QID) | ORAL | Status: DC | PRN

## 2020-11-28 MED ORDER — SODIUM CHLORIDE 0.9 % IR SOLN
Status: DC | PRN
  Administered 2020-11-28: 1000 mL

## 2020-11-28 MED ORDER — FENTANYL CITRATE PF 50 MCG/ML IJ SOSY: 25.0000 ug | PREFILLED_SYRINGE | INTRAMUSCULAR | Status: DC | PRN

## 2020-11-28 MED ORDER — DIPHENHYDRAMINE HCL 12.5 MG/5ML PO ELIX: 12.5000 mg | ORAL_SOLUTION | ORAL | Status: DC | PRN

## 2020-11-28 MED ORDER — ZOLPIDEM TARTRATE 5 MG PO TABS
5.0000 mg | ORAL_TABLET | Freq: Every evening | ORAL | Status: DC | PRN
  Administered 2020-11-28: 5 mg via ORAL
  Filled 2020-11-28: qty 1

## 2020-11-28 MED ORDER — MENTHOL 3 MG MT LOZG: 1.0000 | LOZENGE | OROMUCOSAL | Status: DC | PRN

## 2020-11-28 MED ORDER — STERILE WATER FOR IRRIGATION IR SOLN
Status: DC | PRN
  Administered 2020-11-28: 2000 mL

## 2020-11-28 MED ORDER — ONDANSETRON HCL 4 MG/2ML IJ SOLN
INTRAMUSCULAR | Status: AC
  Filled 2020-11-28: qty 2

## 2020-11-28 MED ORDER — FLEET ENEMA 7-19 GM/118ML RE ENEM: 1.0000 | ENEMA | Freq: Once | RECTAL | Status: DC | PRN

## 2020-11-28 MED ORDER — DIAZEPAM 2 MG PO TABS: 2.0000 mg | ORAL_TABLET | Freq: Every day | ORAL | Status: DC | PRN

## 2020-11-28 MED ORDER — CEFAZOLIN SODIUM-DEXTROSE 2-4 GM/100ML-% IV SOLN
2.0000 g | Freq: Four times a day (QID) | INTRAVENOUS | Status: AC
  Administered 2020-11-28 (×2): 2 g via INTRAVENOUS
  Filled 2020-11-28 (×2): qty 100

## 2020-11-28 MED ORDER — EPHEDRINE SULFATE-NACL 50-0.9 MG/10ML-% IV SOSY
PREFILLED_SYRINGE | INTRAVENOUS | Status: DC | PRN
  Administered 2020-11-28 (×4): 5 mg via INTRAVENOUS

## 2020-11-28 MED ORDER — ONDANSETRON HCL 4 MG/2ML IJ SOLN: 4.0000 mg | Freq: Four times a day (QID) | INTRAMUSCULAR | Status: DC | PRN

## 2020-11-28 MED ORDER — HYDROCODONE-ACETAMINOPHEN 5-325 MG PO TABS
1.0000 | ORAL_TABLET | ORAL | Status: DC | PRN
  Administered 2020-11-28 (×2): 2 via ORAL
  Filled 2020-11-28 (×2): qty 2

## 2020-11-28 MED ORDER — ACETAMINOPHEN 325 MG PO TABS: 325.0000 mg | ORAL_TABLET | Freq: Four times a day (QID) | ORAL | Status: DC | PRN

## 2020-11-28 MED ORDER — CEFAZOLIN SODIUM-DEXTROSE 2-4 GM/100ML-% IV SOLN
2.0000 g | INTRAVENOUS | Status: AC
  Administered 2020-11-28: 2 g via INTRAVENOUS
  Filled 2020-11-28: qty 100

## 2020-11-28 MED ORDER — METHOCARBAMOL 500 MG PO TABS
500.0000 mg | ORAL_TABLET | Freq: Four times a day (QID) | ORAL | Status: DC | PRN
  Administered 2020-11-28 – 2020-11-29 (×3): 500 mg via ORAL
  Filled 2020-11-28 (×3): qty 1

## 2020-11-28 MED ORDER — DEXAMETHASONE SODIUM PHOSPHATE 10 MG/ML IJ SOLN
8.0000 mg | Freq: Once | INTRAMUSCULAR | Status: AC
  Administered 2020-11-28: 8 mg via INTRAVENOUS

## 2020-11-28 MED ORDER — BUPIVACAINE LIPOSOME 1.3 % IJ SUSP
INTRAMUSCULAR | Status: AC
  Filled 2020-11-28: qty 20

## 2020-11-28 SURGICAL SUPPLY — 54 items
ATTUNE MED DOME PAT 32 KNEE (Knees) ×1 IMPLANT
ATTUNE PSFEM RTSZ4 NARCEM KNEE (Femur) ×1 IMPLANT
ATTUNE PSRP INSR SZ4 10 KNEE (Insert) ×1 IMPLANT
BAG COUNTER SPONGE SURGICOUNT (BAG) IMPLANT
BAG SPEC THK2 15X12 ZIP CLS (MISCELLANEOUS) ×1
BAG SPNG CNTER NS LX DISP (BAG)
BAG ZIPLOCK 12X15 (MISCELLANEOUS) ×2 IMPLANT
BASE TIBIAL ROT PLAT SZ 3 KNEE (Knees) IMPLANT
BLADE SAG 18X100X1.27 (BLADE) ×2 IMPLANT
BLADE SAW SGTL 11.0X1.19X90.0M (BLADE) ×2 IMPLANT
BNDG ELASTIC 6X5.8 VLCR STR LF (GAUZE/BANDAGES/DRESSINGS) ×2 IMPLANT
BOWL SMART MIX CTS (DISPOSABLE) ×2 IMPLANT
BSPLAT TIB 3 CMNT ROT PLAT STR (Knees) ×1 IMPLANT
CEMENT BONE SIMPLEX SPEEDSET (Cement) ×2 IMPLANT
CLSR STERI-STRIP ANTIMIC 1/2X4 (GAUZE/BANDAGES/DRESSINGS) ×1 IMPLANT
COVER SURGICAL LIGHT HANDLE (MISCELLANEOUS) ×2 IMPLANT
CUFF TOURN SGL QUICK 34 (TOURNIQUET CUFF) ×2
CUFF TRNQT CYL 34X4.125X (TOURNIQUET CUFF) ×1 IMPLANT
DECANTER SPIKE VIAL GLASS SM (MISCELLANEOUS) ×2 IMPLANT
DRAPE INCISE IOBAN 66X45 STRL (DRAPES) ×2 IMPLANT
DRAPE U-SHAPE 47X51 STRL (DRAPES) ×2 IMPLANT
DRSG AQUACEL AG ADV 3.5X10 (GAUZE/BANDAGES/DRESSINGS) ×2 IMPLANT
DURAPREP 26ML APPLICATOR (WOUND CARE) ×2 IMPLANT
ELECT REM PT RETURN 15FT ADLT (MISCELLANEOUS) ×2 IMPLANT
GLOVE SRG 8 PF TXTR STRL LF DI (GLOVE) ×1 IMPLANT
GLOVE SURG ENC MOIS LTX SZ6.5 (GLOVE) ×2 IMPLANT
GLOVE SURG ENC MOIS LTX SZ8 (GLOVE) ×4 IMPLANT
GLOVE SURG UNDER POLY LF SZ7 (GLOVE) ×2 IMPLANT
GLOVE SURG UNDER POLY LF SZ8 (GLOVE) ×2
GLOVE SURG UNDER POLY LF SZ8.5 (GLOVE) ×2 IMPLANT
GOWN STRL REUS W/TWL LRG LVL3 (GOWN DISPOSABLE) ×4 IMPLANT
GOWN STRL REUS W/TWL XL LVL3 (GOWN DISPOSABLE) ×2 IMPLANT
HANDPIECE INTERPULSE COAX TIP (DISPOSABLE) ×2
HOLDER FOLEY CATH W/STRAP (MISCELLANEOUS) IMPLANT
IMMOBILIZER KNEE 20 (SOFTGOODS) ×2
IMMOBILIZER KNEE 20 THIGH 36 (SOFTGOODS) ×1 IMPLANT
KIT TURNOVER KIT A (KITS) IMPLANT
MANIFOLD NEPTUNE II (INSTRUMENTS) ×2 IMPLANT
NS IRRIG 1000ML POUR BTL (IV SOLUTION) ×2 IMPLANT
PACK TOTAL KNEE CUSTOM (KITS) ×2 IMPLANT
PADDING CAST COTTON 6X4 STRL (CAST SUPPLIES) ×4 IMPLANT
PROTECTOR NERVE ULNAR (MISCELLANEOUS) ×2 IMPLANT
SET HNDPC FAN SPRY TIP SCT (DISPOSABLE) ×1 IMPLANT
STRIP CLOSURE SKIN 1/2X4 (GAUZE/BANDAGES/DRESSINGS) ×4 IMPLANT
SUT MNCRL AB 4-0 PS2 18 (SUTURE) ×2 IMPLANT
SUT STRATAFIX 0 PDS 27 VIOLET (SUTURE) ×2
SUT VIC AB 2-0 CT1 27 (SUTURE) ×6
SUT VIC AB 2-0 CT1 TAPERPNT 27 (SUTURE) ×3 IMPLANT
SUTURE STRATFX 0 PDS 27 VIOLET (SUTURE) ×1 IMPLANT
TIBIAL BASE ROT PLAT SZ 3 KNEE (Knees) ×2 IMPLANT
TRAY FOLEY MTR SLVR 16FR STAT (SET/KITS/TRAYS/PACK) ×2 IMPLANT
TUBE SUCTION HIGH CAP CLEAR NV (SUCTIONS) ×2 IMPLANT
WATER STERILE IRR 1000ML POUR (IV SOLUTION) ×4 IMPLANT
WRAP KNEE MAXI GEL POST OP (GAUZE/BANDAGES/DRESSINGS) ×2 IMPLANT

## 2020-11-28 NOTE — Transfer of Care (Signed)
Immediate Anesthesia Transfer of Care Note  Patient: Catherine [name]  Procedure(s) Performed: TOTAL KNEE ARTHROPLASTY (Right: Knee)  Patient Location: PACU  Anesthesia Type:Spinal and MAC combined with regional for post-op pain  Level of Consciousness: awake, alert , oriented and patient cooperative  Airway & Oxygen Therapy: Patient Spontanous Breathing and Patient connected to face mask oxygen  Post-op Assessment: Report given to RN and Post -op Vital signs reviewed and stable  Post vital signs: Reviewed and stable  Last Vitals:  Vitals Value Taken Time  BP 112/65 11/28/20 0955  Temp    Pulse 72 11/28/20 0957  Resp 16 11/28/20 0957  SpO2 100 % 11/28/20 0957  Vitals shown include unvalidated device data.  Last Pain:  Vitals:   11/28/20 0627  TempSrc:   PainSc: 0-No pain      Patients Stated Pain Goal: 4 (72/25/75 0518)  Complications: No notable events documented.

## 2020-11-28 NOTE — Op Note (Signed)
OPERATIVE REPORT-TOTAL KNEE ARTHROPLASTY   Pre-operative diagnosis- Osteoarthritis  Right knee(s)  Post-operative diagnosis- Osteoarthritis Right knee(s)  Procedure-  Right  Total Knee Arthroplasty  Surgeon- Catherine Plover. Mousa Prout, MD  Assistant- Theresa Duty, PA-C   Anesthesia-   Adductor canal block and spinal  EBL-25 mL   Drains None  Tourniquet time-  Total Tourniquet Time Documented: Thigh (Right) - 35 minutes Total: Thigh (Right) - 35 minutes     Complications- None  Condition-PACU - hemodynamically stable.   Brief Clinical Note  Catherine [name] is a 75 y.o. year old female with end stage OA of her right knee with progressively worsening pain and dysfunction. She has constant pain, with activity and at rest and significant functional deficits with difficulties even with ADLs. She has had extensive non-op management including analgesics, injections of cortisone and viscosupplements, and home exercise program, but remains in significant pain with significant dysfunction.Radiographs show bone on bone arthritis patellofemoral. She presents now for right Total Knee Arthroplasty.     Procedure in detail---   The patient is brought into the operating room and positioned supine on the operating table. After successful administration of  Adductor canal block and spinal,   a tourniquet is placed high on the  Right thigh(s) and the lower extremity is prepped and draped in the usual sterile fashion. Time out is performed by the operating team and then the  Right lower extremity is wrapped in Esmarch, knee flexed and the tourniquet inflated to 300 mmHg.       A midline incision is made with a ten blade through the subcutaneous tissue to the level of the extensor mechanism. A fresh blade is used to make a medial parapatellar arthrotomy. Soft tissue over the proximal medial tibia is subperiosteally elevated to the joint line with a knife and into the semimembranosus bursa with a Cobb  elevator. Soft tissue over the proximal lateral tibia is elevated with attention being paid to avoiding the patellar tendon on the tibial tubercle. The patella is everted, knee flexed 90 degrees and the ACL and PCL are removed. Findings are bone on bone patellofemoral with severe patellar erosion and large osteophytes globally        The drill is used to create a starting hole in the distal femur and the canal is thoroughly irrigated with sterile saline to remove the fatty contents. The 5 degree Right  valgus alignment guide is placed into the femoral canal and the distal femoral cutting block is pinned to remove 9 mm off the distal femur. Resection is made with an oscillating saw.      The tibia is subluxed forward and the menisci are removed. The extramedullary alignment guide is placed referencing proximally at the medial aspect of the tibial tubercle and distally along the second metatarsal axis and tibial crest. The block is pinned to remove 47mm off the more deficient medial  side. Resection is made with an oscillating saw. Size 3is the most appropriate size for the tibia and the proximal tibia is prepared with the modular drill and keel punch for that size.      The femoral sizing guide is placed and size 4 is most appropriate. Rotation is marked off the epicondylar axis and confirmed by creating a rectangular flexion gap at 90 degrees. The size 4 cutting block is pinned in this rotation and the anterior, posterior and chamfer cuts are made with the oscillating saw. The intercondylar block is then placed and that cut is made.  Trial size 3 tibial component, trial size 4 narrow posterior stabilized femur and a 10  mm posterior stabilized rotating platform insert trial is placed. Full extension is achieved with excellent varus/valgus and anterior/posterior balance throughout full range of motion. The patella is everted and thickness measured to be 18  mm. Free hand resection is taken to 11 mm, a 32  template is placed, lug holes are drilled, trial patella is placed, and it tracks normally. Osteophytes are removed off the posterior femur with the trial in place. All trials are removed and the cut bone surfaces prepared with pulsatile lavage. Cement is mixed and once ready for implantation, the size 3 tibial implant, size  4 narrow posterior stabilized femoral component, and the size 32 patella are cemented in place and the patella is held with the clamp. The trial insert is placed and the knee held in full extension. The Exparel (20 ml mixed with 60 ml saline) is injected into the extensor mechanism, posterior capsule, medial and lateral gutters and subcutaneous tissues.  All extruded cement is removed and once the cement is hard the permanent 10 mm posterior stabilized rotating platform insert is placed into the tibial tray.      The wound is copiously irrigated with saline solution and the extensor mechanism closed with # 0 Stratofix suture. The tourniquet is released for a total tourniquet time of 35  minutes. Flexion against gravity is 140 degrees and the patella tracks normally. Subcutaneous tissue is closed with 2.0 vicryl and subcuticular with running 4.0 Monocryl. The incision is cleaned and dried and steri-strips and a bulky sterile dressing are applied. The limb is placed into a knee immobilizer and the patient is awakened and transported to recovery in stable condition.      Please note that a surgical assistant was a medical necessity for this procedure in order to perform it in a safe and expeditious manner. Surgical assistant was necessary to retract the ligaments and vital neurovascular structures to prevent injury to them and also necessary for proper positioning of the limb to allow for anatomic placement of the prosthesis.   Catherine Plover Trulee Hamstra, MD    11/28/2020, 9:41 AM

## 2020-11-28 NOTE — Anesthesia Procedure Notes (Signed)
Spinal  Patient location during procedure: OR Start time: 11/28/2020 8:27 AM End time: 11/28/2020 8:32 AM Reason for block: surgical anesthesia Staffing Performed: anesthesiologist  Anesthesiologist: Roderic Palau, MD Preanesthetic Checklist Completed: patient identified, IV checked, risks and benefits discussed, surgical consent, monitors and equipment checked, pre-op evaluation and timeout performed Spinal Block Patient position: sitting Prep: DuraPrep Patient monitoring: cardiac monitor, continuous pulse ox and blood pressure Approach: midline Location: L3-4 Injection technique: single-shot Needle Needle type: Quincke  Needle gauge: 22 G Needle length: 9 cm Assessment Sensory level: T8 Events: CSF return Additional Notes Functioning IV was confirmed and monitors were applied. Sterile prep and drape, including hand hygiene and sterile gloves were used. The patient was positioned and the spine was prepped. The skin was anesthetized with lidocaine.  Free flow of clear CSF was obtained prior to injecting local anesthetic into the CSF.  The spinal needle aspirated freely following injection.  The needle was carefully withdrawn.  The patient tolerated the procedure well.

## 2020-11-28 NOTE — Care Plan (Signed)
Ortho Bundle Case Management Note  Patient Details  Name: Catherine [name] MRN: [id number] Date of Birth: 03-28-1945  R TKA on 11-28-20 DCP:  Home with husband.  1 story home with 1 ste. DME:  No needs.  Has a RW and 3-in-1. PT:  [facility] 12-01-20.                   DME Arranged:  N/A DME Agency:  NA  HH Arranged:  NA HH Agency:  NA  Additional Comments: Please contact me with any questions of if this plan should need to change.  Marianne Sofia, RN,CCM [facility]  [phone] 11/28/2020, 8:05 AM

## 2020-11-28 NOTE — Interval H&P Note (Signed)
History and Physical Interval Note:  11/28/2020 6:26 AM  Catherine [name]  has presented today for surgery, with the diagnosis of right knee osteoarthritis.  The various methods of treatment have been discussed with the patient and family. After consideration of risks, benefits and other options for treatment, the patient has consented to  Procedure(s): TOTAL KNEE ARTHROPLASTY (Right) as a surgical intervention.  The patient's history has been reviewed, patient examined, no change in status, stable for surgery.  I have reviewed the patient's chart and labs.  Questions were answered to the patient's satisfaction.     Pilar Plate [name]

## 2020-11-28 NOTE — Anesthesia Postprocedure Evaluation (Signed)
Anesthesia Post Note  Patient: SIBLE STRALEY  Procedure(s) Performed: TOTAL KNEE ARTHROPLASTY (Right: Knee)     Patient location during evaluation: PACU Anesthesia Type: Spinal and Regional Level of consciousness: oriented and awake and alert Pain management: pain level controlled Vital Signs Assessment: post-procedure vital signs reviewed and stable Respiratory status: spontaneous breathing and respiratory function stable Cardiovascular status: blood pressure returned to baseline and stable Postop Assessment: no headache, no backache, no apparent nausea or vomiting, spinal receding and patient able to bend at knees Anesthetic complications: no   No notable events documented.  Last Vitals:  Vitals:   11/28/20 1100 11/28/20 1111  BP: 138/87 126/88  Pulse: (!) 57 62  Resp: 14 15  Temp:    SpO2: 94% 100%    Last Pain:  Vitals:   11/28/20 1100  TempSrc:   PainSc: 0-No pain                 Paisely Brick,W. EDMOND

## 2020-11-28 NOTE — Progress Notes (Signed)
AssistedDr. Edmond Fitzgerald with right, ultrasound guided, adductor canal block. Side rails up, monitors on throughout procedure. See vital signs in flow sheet. Tolerated Procedure well.  

## 2020-11-28 NOTE — Anesthesia Procedure Notes (Signed)
Anesthesia Regional Block: Adductor canal block   Pre-Anesthetic Checklist: , timeout performed,  Correct Patient, Correct Site, Correct Laterality,  Correct Procedure, Correct Position, site marked,  Risks and benefits discussed,  Pre-op evaluation,  At surgeon's request and post-op pain management  Laterality: Right  Prep: Maximum Sterile Barrier Precautions used, chloraprep       Needles:  Injection technique: Single-shot  Needle Type: Echogenic Stimulator Needle     Needle Length: 9cm  Needle Gauge: 21     Additional Needles:   Procedures:,,,, ultrasound used (permanent image in chart),,    Narrative:  Start time: 11/28/2020 7:24 AM End time: 11/28/2020 7:34 AM Injection made incrementally with aspirations every 5 mL.  Performed by: Personally  Anesthesiologist: Roderic Palau, MD  Additional Notes: 2% Lidocaine skin wheel.

## 2020-11-28 NOTE — Evaluation (Signed)
Physical Therapy Evaluation Patient Details Name: KYNNADI DICENSO MRN: [id number] DOB: 31-Mar-1945 Today's Date: 11/28/2020  History of Present Illness  s/p R TKA. PMH: spondylosis, ALIF, PLIF, L TKA, RCR  Clinical Impression  Pt is s/p TKA resulting in the deficits listed below (see PT Problem List).  Pt amb ~ 23' with RW and min assist, initiated TKA HEP. Anticipate steady progress in acute setting   Pt will benefit from skilled PT to increase their independence and safety with mobility to allow discharge to the venue listed below.         Recommendations for follow up therapy are one component of a multi-disciplinary discharge planning process, led by the attending physician.  Recommendations may be updated based on patient status, additional functional criteria and insurance authorization.  Follow Up Recommendations Follow physician's recommendations for discharge plan and follow up therapies    Assistance Recommended at Discharge    Functional Status Assessment Patient has had a recent decline in their functional status and demonstrates the ability to make significant improvements in function in a reasonable and predictable amount of time.  Equipment Recommendations  None recommended by PT    Recommendations for Other Services       Precautions / Restrictions Precautions Precautions: Fall;Knee Required Braces or Orthoses: Knee Immobilizer - Left Knee Immobilizer - Left: Discontinue once straight leg raise with < 10 degree lag Restrictions Weight Bearing Restrictions: No Other Position/Activity Restrictions: WBAT      Mobility  Bed Mobility Overal bed mobility: Needs Assistance Bed Mobility: Supine to Sit     Supine to sit: Min guard     General bed mobility comments: for safety    Transfers Overall transfer level: Needs assistance Equipment used: Rolling walker (2 wheels)               General transfer comment: cues for hand placement and R LE position     Ambulation/Gait Ambulation/Gait assistance: Min assist Gait Distance (Feet): 75 Feet Assistive device: Rolling walker (2 wheels) Gait Pattern/deviations: Step-to pattern;Decreased stance time - right       General Gait Details: cues for sequence and proximity to [employer] Mobility    Modified Rankin (Stroke Patients Only)       Balance                                             Pertinent Vitals/Pain Pain Assessment: 0-10 Pain Score: 4  Pain Location: right knee Pain Descriptors / Indicators: Aching;Discomfort;Sore Pain Intervention(s): Premedicated before session;Monitored during session;Limited activity within patient's tolerance;Repositioned;Ice applied    Home Living Family/patient expects to be discharged to:: Private residence Living Arrangements: Spouse/significant other Available Help at Discharge: Family Type of Home: House Home Access: Stairs to enter   Technical brewer of Steps: 1   Home Layout: Able to live on main level with bedroom/bathroom Home Equipment: Conservation officer, nature (2 wheels);Other (comment);Crutches;BSC/3in1;Shower seat Additional Comments: hiking stick    Prior Function Prior Level of Function : Independent/Modified Independent                     Hand Dominance        Extremity/Trunk Assessment   Upper Extremity Assessment Upper Extremity Assessment: Overall WFL for tasks assessed    Lower Extremity Assessment Lower Extremity  Assessment: LLE deficits/detail LLE Deficits / Details: ankle WFL, knee extension and hip fleixon 2+/5, AAROM knee flexion grossly 5 to 60degrees       Communication   Communication: No difficulties  Cognition Arousal/Alertness: Awake/alert Behavior During Therapy: WFL for tasks assessed/performed Overall Cognitive Status: Within Functional Limits for tasks assessed                                          General  Comments      Exercises Total Joint Exercises Ankle Circles/Pumps: AROM;5 reps;Both Quad Sets: AROM;Both;5 reps Heel Slides: AAROM;Right;5 reps   Assessment/Plan    PT Assessment Patient needs continued PT services  PT Problem List Decreased strength;Decreased range of motion;Decreased activity tolerance;Decreased mobility;Pain;Decreased knowledge of use of DME       PT Treatment Interventions DME instruction;Therapeutic activities;Functional mobility training;Gait training;Therapeutic exercise;Stair training;Patient/family education    PT Goals (Current goals can be found in the Care Plan section)  Acute Rehab PT Goals Patient Stated Goal: have less pain, back to being active PT Goal Formulation: With patient Time For Goal Achievement: 12/05/20 Potential to Achieve Goals: Good    Frequency 7X/week   Barriers to discharge        Co-evaluation               AM-PAC PT "6 Clicks" Mobility  Outcome Measure Help needed turning from your back to your side while in a flat bed without using bedrails?: A Little Help needed moving from lying on your back to sitting on the side of a flat bed without using bedrails?: A Little Help needed moving to and from a bed to a chair (including a wheelchair)?: A Little Help needed standing up from a chair using your arms (e.g., wheelchair or bedside chair)?: A Little Help needed to walk in hospital room?: A Little Help needed climbing 3-5 steps with a railing? : A Little 6 Click Score: 18    End of Session Equipment Utilized During Treatment: Gait belt Activity Tolerance: Patient tolerated treatment well Patient left: with call bell/phone within reach;in chair;with chair alarm set Nurse Communication: Mobility status PT Visit Diagnosis: Other abnormalities of gait and mobility (R26.89);Difficulty in walking, not elsewhere classified (R26.2)    Time: 1601-0932 PT Time Calculation (min) (ACUTE ONLY): 33 min   Charges:   PT  Evaluation $PT Eval Low Complexity: 1 Low PT Treatments $Gait Training: 8-22 mins        Baxter Flattery, PT  Acute Rehab Dept ([place]) [phone] Pager [phone]  11/28/2020   [facility] 11/28/2020, 5:09 PM

## 2020-11-28 NOTE — Progress Notes (Signed)
Orthopedic Tech Progress Note Patient Details:  Catherine [name] Dec 06, 1945 [id number]  CPM Right Knee CPM Right Knee: On Right Knee Flexion (Degrees): 40 Right Knee Extension (Degrees): 10  Post Interventions Patient Tolerated: Well Instructions Provided: Care of device, Adjustment of device  [place] Pink 11/28/2020, 11:04 AM

## 2020-11-28 NOTE — Anesthesia Procedure Notes (Signed)
Procedure Name: MAC Date/Time: 11/28/2020 8:26 AM Performed by: Lollie Sails, CRNA Pre-anesthesia Checklist: Patient identified, Emergency Drugs available, Suction available, Patient being monitored and Timeout performed Oxygen Delivery Method: Simple face mask Placement Confirmation: positive ETCO2

## 2020-11-28 NOTE — Discharge Instructions (Signed)
 Frank Aluisio, MD Total Joint Specialist [facility] [place] [address] [place], [place] [address] [phone]  TOTAL KNEE REPLACEMENT POSTOPERATIVE DIRECTIONS    Knee Rehabilitation, Guidelines Following Surgery  Results after knee surgery are often greatly improved when you follow the exercise, range of motion and muscle strengthening exercises prescribed by your doctor. Safety measures are also important to protect the knee from further injury. If any of these exercises cause you to have increased pain or swelling in your knee joint, decrease the amount until you are comfortable again and slowly increase them. If you have problems or questions, call your caregiver or physical therapist for advice.   HOME CARE INSTRUCTIONS  Remove items at home which could result in a fall. This includes throw rugs or furniture in walking pathways.  ICE to the affected knee as much as tolerated. Icing helps control swelling. If the swelling is well controlled you will be more comfortable and rehab easier. Continue to use ice on the knee for pain and swelling from surgery. You may notice swelling that will progress down to the foot and ankle. This is normal after surgery. Elevate the leg when you are not up walking on it.    Continue to use the breathing machine which will help keep your temperature down. It is common for your temperature to cycle up and down following surgery, especially at night when you are not up moving around and exerting yourself. The breathing machine keeps your lungs expanded and your temperature down. Do not place pillow under the operative knee, focus on keeping the knee straight while resting  DIET You may resume your previous home diet once you are discharged from the hospital.  DRESSING / WOUND CARE / SHOWERING Keep your bulky bandage on for 2 days. On the third post-operative day you may remove the Ace bandage and gauze. There is a waterproof  adhesive bandage on your skin which will stay in place until your first follow-up appointment. Once you remove this you will not need to place another bandage You may begin showering 3 days following surgery, but do not submerge the incision under water.  ACTIVITY For the first 5 days, the key is rest and control of pain and swelling Do your home exercises twice a day starting on post-operative day 3. On the days you go to physical therapy, just do the home exercises once that day. You should rest, ice and elevate the leg for 50 minutes out of every hour. Get up and walk/stretch for 10 minutes per hour. After 5 days you can increase your activity slowly as tolerated. Walk with your walker as instructed. Use the walker until you are comfortable transitioning to a cane. Walk with the cane in the opposite hand of the operative leg. You may discontinue the cane once you are comfortable and walking steadily. Avoid periods of inactivity such as sitting longer than an hour when not asleep. This helps prevent blood clots.  You may discontinue the knee immobilizer once you are able to perform a straight leg raise while lying down. You may resume a sexual relationship in one month or when given the OK by your doctor.  You may return to work once you are cleared by your doctor.  Do not drive a car for 6 weeks or until released by your surgeon.  Do not drive while taking narcotics.  TED HOSE STOCKINGS Wear the elastic stockings on both legs for three weeks following surgery during the   day. You may remove them at night for sleeping.  WEIGHT BEARING Weight bearing as tolerated with assist device (walker, cane, etc) as directed, use it as long as suggested by your surgeon or therapist, typically at least 4-6 weeks.  POSTOPERATIVE CONSTIPATION PROTOCOL Constipation - defined medically as fewer than three stools per week and severe constipation as less than one stool per week.  One of the most common issues  patients have following surgery is constipation.  Even if you have a regular bowel pattern at home, your normal regimen is likely to be disrupted due to multiple reasons following surgery.  Combination of anesthesia, postoperative narcotics, change in appetite and fluid intake all can affect your bowels.  In order to avoid complications following surgery, here are some recommendations in order to help you during your recovery period.  Colace (docusate) - Pick up an over-the-counter form of Colace or another stool softener and take twice a day as long as you are requiring postoperative pain medications.  Take with a full glass of water daily.  If you experience loose stools or diarrhea, hold the colace until you stool forms back up. If your symptoms do not get better within 1 week or if they get worse, check with your doctor. Dulcolax (bisacodyl) - Pick up over-the-counter and take as directed by the product packaging as needed to assist with the movement of your bowels.  Take with a full glass of water.  Use this product as needed if not relieved by Colace only.  MiraLax (polyethylene glycol) - Pick up over-the-counter to have on hand. MiraLax is a solution that will increase the amount of water in your bowels to assist with bowel movements.  Take as directed and can mix with a glass of water, juice, soda, coffee, or tea. Take if you go more than two days without a movement. Do not use MiraLax more than once per day. Call your doctor if you are still constipated or irregular after using this medication for 7 days in a row.  If you continue to have problems with postoperative constipation, please contact the office for further assistance and recommendations.  If you experience "the worst abdominal pain ever" or develop nausea or vomiting, please contact the office immediatly for further recommendations for treatment.  ITCHING If you experience itching with your medications, try taking only a single pain  pill, or even half a pain pill at a time.  You can also use Benadryl over the counter for itching or also to help with sleep.   MEDICATIONS See your medication summary on the "After Visit Summary" that the nursing staff will review with you prior to discharge.  You may have some home medications which will be placed on hold until you complete the course of blood thinner medication.  It is important for you to complete the blood thinner medication as prescribed by your surgeon.  Continue your approved medications as instructed at time of discharge.  PRECAUTIONS If you experience chest pain or shortness of breath - call 911 immediately for transfer to the hospital emergency department.  If you develop a fever greater that 101 F, purulent drainage from wound, increased redness or drainage from wound, foul odor from the wound/dressing, or calf pain - CONTACT YOUR SURGEON.                                                     FOLLOW-UP APPOINTMENTS Make sure you keep all of your appointments after your operation with your surgeon and caregivers. You should call the office at the above phone number and make an appointment for approximately two weeks after the date of your surgery or on the date instructed by your surgeon outlined in the "After Visit Summary".  RANGE OF MOTION AND STRENGTHENING EXERCISES  Rehabilitation of the knee is important following a knee injury or an operation. After just a few days of immobilization, the muscles of the thigh which control the knee become weakened and shrink (atrophy). Knee exercises are designed to build up the tone and strength of the thigh muscles and to improve knee motion. Often times heat used for twenty to thirty minutes before working out will loosen up your tissues and help with improving the range of motion but do not use heat for the first two weeks following surgery. These exercises can be done on a training (exercise) mat, on the floor, on a table or on a bed.  Use what ever works the best and is most comfortable for you Knee exercises include:  Leg Lifts - While your knee is still immobilized in a splint or cast, you can do straight leg raises. Lift the leg to 60 degrees, hold for 3 sec, and slowly lower the leg. Repeat 10-20 times 2-3 times daily. Perform this exercise against resistance later as your knee gets better.  Quad and Hamstring Sets - Tighten up the muscle on the front of the thigh (Quad) and hold for 5-10 sec. Repeat this 10-20 times hourly. Hamstring sets are done by pushing the foot backward against an object and holding for 5-10 sec. Repeat as with quad sets.  Leg Slides: Lying on your back, slowly slide your foot toward your buttocks, bending your knee up off the floor (only go as far as is comfortable). Then slowly slide your foot back down until your leg is flat on the floor again. Angel Wings: Lying on your back spread your legs to the side as far apart as you can without causing discomfort.  A rehabilitation program following serious knee injuries can speed recovery and prevent re-injury in the future due to weakened muscles. Contact your doctor or a physical therapist for more information on knee rehabilitation.   POST-OPERATIVE OPIOID TAPER INSTRUCTIONS: It is important to wean off of your opioid medication as soon as possible. If you do not need pain medication after your surgery it is ok to stop day one. Opioids include: Codeine, Hydrocodone(Norco, Vicodin), Oxycodone(Percocet, oxycontin) and hydromorphone amongst others.  Long term and even short term use of opiods can cause: Increased pain response Dependence Constipation Depression Respiratory depression And more.  Withdrawal symptoms can include Flu like symptoms Nausea, vomiting And more Techniques to manage these symptoms Hydrate well Eat regular healthy meals Stay active Use relaxation techniques(deep breathing, meditating, yoga) Do Not substitute Alcohol to help  with tapering If you have been on opioids for less than two weeks and do not have pain than it is ok to stop all together.  Plan to wean off of opioids This plan should start within one week post op of your joint replacement. Maintain the same interval or time between taking each dose and first decrease the dose.  Cut the total daily intake of opioids by one tablet each day Next start to increase the time between doses. The last dose that should be eliminated is the evening dose.   IF YOU ARE TRANSFERRED TO   A SKILLED REHAB FACILITY If the patient is transferred to a skilled rehab facility following release from the hospital, a list of the current medications will be sent to the facility for the patient to continue.  When discharged from the skilled rehab facility, please have the facility set up the patient's Home Health Physical Therapy prior to being released. Also, the skilled facility will be responsible for providing the patient with their medications at time of release from the facility to include their pain medication, the muscle relaxants, and their blood thinner medication. If the patient is still at the rehab facility at time of the two week follow up appointment, the skilled rehab facility will also need to assist the patient in arranging follow up appointment in our office and any transportation needs.  MAKE SURE YOU:  Understand these instructions.  Get help right away if you are not doing well or get worse.   DENTAL ANTIBIOTICS:  In most cases prophylactic antibiotics for Dental procdeures after total joint surgery are not necessary.  Exceptions are as follows:  1. History of prior total joint infection  2. Severely immunocompromised (Organ Transplant, cancer chemotherapy, Rheumatoid biologic meds such as Humera)  3. Poorly controlled diabetes (A1C &gt; 8.0, blood glucose over 200)  If you have one of these conditions, contact your surgeon for an antibiotic prescription,  prior to your dental procedure.    Pick up stool softner and laxative for home use following surgery while on pain medications. Do not submerge incision under water. Please use good hand washing techniques while changing dressing each day. May shower starting three days after surgery. Please use a clean towel to pat the incision dry following showers. Continue to use ice for pain and swelling after surgery. Do not use any lotions or creams on the incision until instructed by your surgeon.  

## 2020-11-29 ENCOUNTER — Ambulatory Visit: Payer: PPO

## 2020-11-29 DIAGNOSIS — M1711 Unilateral primary osteoarthritis, right knee: Secondary | ICD-10-CM | POA: Diagnosis not present

## 2020-11-29 LAB — CBC
HCT: 31.8 % — ABNORMAL LOW (ref 36.0–46.0)
Hemoglobin: 11 g/dL — ABNORMAL LOW (ref 12.0–15.0)
MCH: 32.1 pg (ref 26.0–34.0)
MCHC: 34.6 g/dL (ref 30.0–36.0)
MCV: 92.7 fL (ref 80.0–100.0)
Platelets: 225 10*3/uL (ref 150–400)
RBC: 3.43 MIL/uL — ABNORMAL LOW (ref 3.87–5.11)
RDW: 11.9 % (ref 11.5–15.5)
WBC: 8.7 10*3/uL (ref 4.0–10.5)
nRBC: 0 % (ref 0.0–0.2)

## 2020-11-29 LAB — BASIC METABOLIC PANEL
Anion gap: 6 (ref 5–15)
BUN: 10 mg/dL (ref 8–23)
CO2: 25 mmol/L (ref 22–32)
Calcium: 8.4 mg/dL — ABNORMAL LOW (ref 8.9–10.3)
Chloride: 107 mmol/L (ref 98–111)
Creatinine, Ser: 0.48 mg/dL (ref 0.44–1.00)
GFR, Estimated: >60 mL/min (ref >60)
Glucose, Bld: 142 mg/dL — ABNORMAL HIGH (ref 70–99)
Potassium: 4.3 mmol/L (ref 3.5–5.1)
Sodium: 138 mmol/L (ref 135–145)

## 2020-11-29 MED ORDER — METHOCARBAMOL 500 MG PO TABS
500.0000 mg | ORAL_TABLET | Freq: Four times a day (QID) | ORAL | 0 refills | Status: DC | PRN
Start: 2020-11-29 — End: 2020-12-30

## 2020-11-29 MED ORDER — HYDROCODONE-ACETAMINOPHEN 5-325 MG PO TABS
1.0000 | ORAL_TABLET | Freq: Four times a day (QID) | ORAL | 0 refills | Status: DC | PRN
Start: 2020-11-29 — End: 2022-10-26

## 2020-11-29 MED ORDER — TRAMADOL HCL 50 MG PO TABS: 50.0000 mg | ORAL_TABLET | Freq: Four times a day (QID) | ORAL | 0 refills | Status: DC | PRN

## 2020-11-29 MED ORDER — GABAPENTIN 300 MG PO CAPS: ORAL_CAPSULE | ORAL | 0 refills | Status: DC

## 2020-11-29 NOTE — TOC Transition Note (Signed)
Transition of Care [facility]) - CM/SW Discharge Note  Patient Details  Name: Catherine [name] MRN: [id number] Date of Birth: 1945-03-03  Transition of Care [facility]) CM/SW Contact:  Sherie Don, LCSW Phone Number: 11/29/2020, 10:00 AM  Clinical Narrative: Patient is expected to discharge home after working with PT. CSW met with patient and her husband to confirm discharge plan. Patient will discharge home with OPPT at [facility] with the first appointment scheduled for 12/01/20. Patient has a rolling walker at home and reported she does not need a 3N1 as she has elevated toilets. TOC signing off.  Final next level of care: OP Rehab Barriers to Discharge: No Barriers Identified  Patient Goals and CMS Choice Patient states their goals for this hospitalization and ongoing recovery are:: Discharge home with OPPT at [facility] Choice offered to / list presented to : NA  Discharge Plan and Services        DME Arranged: N/A DME Agency: NA HH Arranged: NA HH Agency: NA  Readmission Risk Interventions No flowsheet data found.

## 2020-11-29 NOTE — Progress Notes (Signed)
Patient does not have a legla guardian. Entered in error during preadmission testing appointment   [place], RN

## 2020-11-29 NOTE — Progress Notes (Signed)
Subjective: 1 Day Post-Op Procedure(s) (LRB): TOTAL KNEE ARTHROPLASTY (Right) Patient reports pain as mild.   Patient seen in rounds by Dr. Wynelle Link. Patient is well, and has had no acute complaints or problems. States she is ready to go home. No issues overnight, foley catheter removed this AM. We will continue therapy today.   Objective: Vital signs in last 24 hours: Temp:  [97.5 F (36.4 C)-98.2 F (36.8 C)] 98.2 F (36.8 C) (11/08 0620) Pulse Rate:  [55-87] 87 (11/08 0620) Resp:  [14-18] 16 (11/08 0620) BP: (103-151)/(47-88) 107/57 (11/08 0620) SpO2:  [94 %-100 %] 100 % (11/08 0620)  Intake/Output from previous day:  Intake/Output Summary (Last 24 hours) at 11/29/2020 0759 Last data filed at 11/29/2020 0600 Gross per 24 hour  Intake 3261.62 ml  Output 3700 ml  Net -438.38 ml     Intake/Output this shift: No intake/output data recorded.  Labs: Recent Labs    11/29/20 0327  HGB 11.0*   Recent Labs    11/29/20 0327  WBC 8.7  RBC 3.43*  HCT 31.8*  PLT 225   Recent Labs    11/29/20 0327  NA 138  K 4.3  CL 107  CO2 25  BUN 10  CREATININE 0.48  GLUCOSE 142*  CALCIUM 8.4*   No results for input(s): LABPT, INR in the last 72 hours.  Exam: General - Patient is Alert and Oriented Extremity - Neurologically intact Neurovascular intact Sensation intact distally Dorsiflexion/Plantar flexion intact Dressing - dressing C/D/I Motor Function - intact, moving foot and toes well on exam.   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   Weight as of this encounter: 48.1 kg. Advance diet Up with therapy D/C IV fluids   Patient's anticipated LOS is less than 2 midnights, meeting these requirements: - Lives within 1 hour of care - Has a competent adult at home to recover with post-op recover - NO history of  - Chronic pain requiring opioids  - Diabetes  - Coronary Artery Disease  - Heart failure  - Heart attack  - Stroke  - DVT/VTE  - Respiratory Failure/COPD  - Renal failure  - Anemia  - Advanced Liver disease  DVT Prophylaxis -  Eliquis Weight bearing as tolerated. Continue therapy.  Plan is to go Home after hospital stay. Plan for discharge after two sessions Scheduled for OPPT at [facility] Follow-up in the office in 2 weeks  [facility] [place] was reviewed today prior to any opioid medications being prescribed to this patient.  Theresa Duty, PA-C Orthopedic Surgery [phone])  [phone] 11/29/2020, 7:59 AM

## 2020-11-29 NOTE — Progress Notes (Incomplete)
RN reviewed discharge instructions with patient and family. All questions answered.   Paperwork given. Prescriptions electronically sent to patient pharmacy.    NT rolled patient down with all belongings to family car.     Login Muckleroy, RN  

## 2020-11-29 NOTE — Progress Notes (Signed)
Physical Therapy Treatment Patient Details Name: Catherine [name] MRN: [id number] DOB: 1945/02/19 Today's Date: 11/29/2020   History of Present Illness s/p R TKA. PMH: spondylosis, ALIF, PLIF, L TKA, RCR    PT Comments    Pt progressing exceptionally well. Reviewed gait, stairs, transfer safety. Will see for second session to review HEP and pt should be ready to d/c early afternoon.   Recommendations for follow up therapy are one component of a multi-disciplinary discharge planning process, led by the attending physician.  Recommendations may be updated based on patient status, additional functional criteria and insurance authorization.  Follow Up Recommendations  Follow physician's recommendations for discharge plan and follow up therapies     Assistance Recommended at Discharge Intermittent Supervision/Assistance  Equipment Recommendations  None recommended by PT    Recommendations for Other Services       Precautions / Restrictions Precautions Precautions: Fall;Knee Precaution Comments: IND SLRs Required Braces or Orthoses: Knee Immobilizer - Right Knee Immobilizer - Right: Discontinue once straight leg raise with < 10 degree lag Restrictions Weight Bearing Restrictions: No Other Position/Activity Restrictions: WBAT     Mobility  Bed Mobility               General bed mobility comments: in recliner    Transfers Overall transfer level: Needs assistance Equipment used: Rolling walker (2 wheels) Transfers: Sit to/from Stand Sit to Stand: Supervision           General transfer comment: cues for hand placement and R LE position    Ambulation/Gait Ambulation/Gait assistance: Supervision;Min guard Gait Distance (Feet): 200 Feet Assistive device: Rolling walker (2 wheels) Gait Pattern/deviations: Step-to pattern;Decreased stance time - right;Step-through pattern       General Gait Details: cues for sequence and proximity to RW adn progression to step  through gait. steady with RW, no LOB   Stairs Stairs: Yes Stairs assistance: Min guard Stair Management: Two rails;Step to pattern;Forwards Number of Stairs: 4 General stair comments: cues for sequence and technique, last step to simulate one step pt has to enter home.   Wheelchair Mobility    Modified Rankin (Stroke Patients Only)       Balance                                            Cognition Arousal/Alertness: Awake/alert Behavior During Therapy: WFL for tasks assessed/performed Overall Cognitive Status: Within Functional Limits for tasks assessed                                          Exercises      General Comments        Pertinent Vitals/Pain Pain Assessment: 0-10 Pain Score: 3  Pain Location: right knee Pain Descriptors / Indicators: Aching;Discomfort;Sore Pain Intervention(s): Limited activity within patient's tolerance;Monitored during session;Premedicated before session;Repositioned;Ice applied    Home Living                          Prior Function            PT Goals (current goals can now be found in the care plan section) Acute Rehab PT Goals Patient Stated Goal: have less pain, back to being active PT Goal Formulation: With patient Time For Goal  Achievement: 12/05/20 Potential to Achieve Goals: Good Progress towards PT goals: Progressing toward goals    Frequency    7X/week      PT Plan Current plan remains appropriate    Co-evaluation              AM-PAC PT "6 Clicks" Mobility   Outcome Measure  Help needed turning from your back to your side while in a flat bed without using bedrails?: A Little Help needed moving from lying on your back to sitting on the side of a flat bed without using bedrails?: A Little Help needed moving to and from a bed to a chair (including a wheelchair)?: A Little Help needed standing up from a chair using your arms (e.g., wheelchair or bedside  chair)?: A Little Help needed to walk in hospital room?: A Little Help needed climbing 3-5 steps with a railing? : A Little 6 Click Score: 18    End of Session Equipment Utilized During Treatment: Gait belt Activity Tolerance: Patient tolerated treatment well Patient left: with call bell/phone within reach;in chair;with chair alarm set Nurse Communication: Mobility status PT Visit Diagnosis: Other abnormalities of gait and mobility (R26.89);Difficulty in walking, not elsewhere classified (R26.2)     Time: 5035-4656 PT Time Calculation (min) (ACUTE ONLY): 28 min  Charges:  $Gait Training: 23-37 mins                     Baxter Flattery, PT  Acute Rehab Dept ([place]) [phone] Pager [phone]  11/29/2020    [facility] 11/29/2020, 10:46 AM

## 2020-11-29 NOTE — Progress Notes (Signed)
11/29/20 1300  PT Visit Information  Last PT Received On 11/29/20  Pt progressing well. Reviewed TKA HEP and progression of mobility at home, avoiding over exerting in the initial healing phase. Pt is very pleasant and motivated, anticipate she will have an excellent recovery.   Assistance Needed +1  History of Present Illness s/p R TKA. PMH: spondylosis, ALIF, PLIF, L TKA, RCR  Subjective Data  Patient Stated Goal have less pain, back to being active  Precautions  Precautions Fall;Knee  Precaution Comments IND SLRs  Required Braces or Orthoses Knee Immobilizer - Right  Knee Immobilizer - Right Discontinue once straight leg raise with < 10 degree lag  Restrictions  Weight Bearing Restrictions No  Other Position/Activity Restrictions WBAT  Pain Assessment  Pain Assessment 0-10  Pain Location right knee  Pain Descriptors / Indicators Aching;Discomfort;Sore  Cognition  Arousal/Alertness Awake/alert  Behavior During Therapy WFL for tasks assessed/performed  Overall Cognitive Status Within Functional Limits for tasks assessed  Bed Mobility  General bed mobility comments in recliner  Transfers  Overall transfer level Needs assistance  Equipment used Rolling walker (2 wheels)  Transfers Sit to/from Stand  Sit to Stand Supervision;Modified independent (Device/Increase time)  General transfer comment cues for hand placement and R LE position  Ambulation/Gait  Ambulation/Gait assistance Supervision;Min guard  Gait Distance (Feet) 100 Feet  Assistive device Rolling walker (2 wheels)  Gait Pattern/deviations Step-to pattern;Decreased stance time - right;Step-through pattern  General Gait Details cues for sequence and proximity to RW and progression to step through gait. steady with RW, no LOB. improved fluidity of gait  Total Joint Exercises  Ankle Circles/Pumps AROM;Both;10 reps  Quad Sets AROM;Both;10 reps  Heel Slides AROM;AAROM;Right;10 reps  Hip ABduction/ADduction  AROM;Right;10 reps  Straight Leg Raises AROM;10 reps;Right  Goniometric ROM grossly 5 to 80 degrees right knee flexion  PT - End of Session  Equipment Utilized During Treatment Gait belt  Activity Tolerance Patient tolerated treatment well  Patient left with call bell/phone within reach;in chair;with chair alarm set  Nurse Communication Mobility status   PT - Assessment/Plan  PT Plan Current plan remains appropriate  PT Visit Diagnosis Other abnormalities of gait and mobility (R26.89);Difficulty in walking, not elsewhere classified (R26.2)  PT Frequency (ACUTE ONLY) 7X/week  Follow Up Recommendations Follow physician's recommendations for discharge plan and follow up therapies  Assistance recommended at discharge Intermittent Supervision/Assistance  PT equipment None recommended by PT  AM-PAC PT "6 Clicks" Mobility Outcome Measure (Version 2)  Help needed turning from your back to your side while in a flat bed without using bedrails? 4  Help needed moving from lying on your back to sitting on the side of a flat bed without using bedrails? 4  Help needed moving to and from a bed to a chair (including a wheelchair)? 4  Help needed standing up from a chair using your arms (e.g., wheelchair or bedside chair)? 4  Help needed to walk in hospital room? 3  Help needed climbing 3-5 steps with a railing?  3  6 Click Score 22  Consider Recommendation of Discharge To: Home with no services  PT Goal Progression  Progress towards PT goals Progressing toward goals  Acute Rehab PT Goals  PT Goal Formulation With patient  Time For Goal Achievement 12/05/20  Potential to Achieve Goals Good  PT Time Calculation  PT Start Time (ACUTE ONLY) 1154  PT Stop Time (ACUTE ONLY) 1210  PT Time Calculation (min) (ACUTE ONLY) 16 min  PT General  Charges  $$ ACUTE PT VISIT 1 Visit  PT Treatments  $Therapeutic Exercise 8-22 mins

## 2020-11-30 ENCOUNTER — Encounter (HOSPITAL_COMMUNITY): Payer: Self-pay | Admitting: Orthopedic Surgery

## 2020-12-01 DIAGNOSIS — M25561 Pain in right knee: Secondary | ICD-10-CM | POA: Diagnosis not present

## 2020-12-05 DIAGNOSIS — M25561 Pain in right knee: Secondary | ICD-10-CM | POA: Diagnosis not present

## 2020-12-05 NOTE — Discharge Summary (Signed)
Physician Discharge Summary   Patient ID: LILLEY HUBBLE MRN: [id number] DOB/AGE: 02/18/1945 75 y.o.  Admit date: 11/28/2020 Discharge date: 11/29/2020  Primary Diagnosis: Osteoarthritis, right knee   Admission Diagnoses:  Past Medical History:  Diagnosis Date   Abnormal glandular Papanicolaou smear of cervix 11/04/2014   Arthritis    Back pain    CIN I (cervical intraepithelial neoplasia I)    LEEP 2006 margins free      negative HR HPV 7342    Complication of anesthesia    Difficult intubation    needs pediatric equipment   Dysrhythmia    afib   Fever blister    Herniated nucleus pulposus, L5-S1 08/10/2014   Insomnia    Neck pain    Pseudoarthrosis of lumbar spine    Spasmodic dysphonia    Spondylolysis, lumbosacral 08/13/2014   Spondylitic Stenosis   Discharge Diagnoses:   Principal Problem:   OA (osteoarthritis) of knee Active Problems:   Primary osteoarthritis of right knee  Estimated body mass index is 19.7 kg/m as calculated from the following:   Height as of this encounter: 5' 1.5" (1.562 m).   Weight as of this encounter: 48.1 kg.  Procedure:  Procedure(s) (LRB): TOTAL KNEE ARTHROPLASTY (Right)   Consults: None  HPI: Catherine [name] is a 75 y.o. year old female with end stage OA of her right knee with progressively worsening pain and dysfunction. She has constant pain, with activity and at rest and significant functional deficits with difficulties even with ADLs. She has had extensive non-op management including analgesics, injections of cortisone and viscosupplements, and home exercise program, but remains in significant pain with significant dysfunction.Radiographs show bone on bone arthritis patellofemoral. She presents now for right Total Knee Arthroplasty.     Laboratory Data: Admission on 11/28/2020, Discharged on 11/29/2020  Component Date Value Ref Range Status   WBC 11/29/2020 8.7  4.0 - 10.5 K/uL Final   RBC 11/29/2020 3.43 (A)  3.87 - 5.11  MIL/uL Final   Hemoglobin 11/29/2020 11.0 (A)  12.0 - 15.0 g/dL Final   HCT 11/29/2020 31.8 (A)  36.0 - 46.0 % Final   MCV 11/29/2020 92.7  80.0 - 100.0 fL Final   MCH 11/29/2020 32.1  26.0 - 34.0 pg Final   MCHC 11/29/2020 34.6  30.0 - 36.0 g/dL Final   RDW 11/29/2020 11.9  11.5 - 15.5 % Final   Platelets 11/29/2020 225  150 - 400 K/uL Final   nRBC 11/29/2020 0.0  0.0 - 0.2 % Final   Performed at [facility], [place] [address], [place], [place] [address]   Sodium 11/29/2020 138  135 - 145 mmol/L Final   Potassium 11/29/2020 4.3  3.5 - 5.1 mmol/L Final   Chloride 11/29/2020 107  98 - 111 mmol/L Final   CO2 11/29/2020 25  22 - 32 mmol/L Final   Glucose, Bld 11/29/2020 142 (A)  70 - 99 mg/dL Final   Glucose reference range applies only to samples taken after fasting for at least 8 hours.   BUN 11/29/2020 10  8 - 23 mg/dL Final   Creatinine, Ser 11/29/2020 0.48  0.44 - 1.00 mg/dL Final   Calcium 11/29/2020 8.4 (A)  8.9 - 10.3 mg/dL Final   GFR, Estimated 11/29/2020 >60  >60 mL/min Final   Comment: (NOTE) Calculated using the CKD-EPI Creatinine Equation (2021)    Anion gap 11/29/2020 6  5 - 15 Final   Performed at [facility], [place]  [address] [place], [place] [address]  Orders Only on 11/25/2020  Component Date Value Ref Range Status   SARS Coronavirus 2 11/25/2020 RESULT: NEGATIVE   Final   Comment: RESULT: NEGATIVESARS-CoV-2 INTERPRETATION:A NEGATIVE  test result means that SARS-CoV-2 RNA was not present in the specimen above the limit of detection of this test. This does not preclude a possible SARS-CoV-2 infection and should not be used as the  sole basis for patient management decisions. Negative results must be combined with clinical observations, patient history, and epidemiological information. Optimum specimen types and timing for peak viral levels during infections caused by SARS-CoV-2  have not been determined. Collection of multiple specimens  or types of specimens may be necessary to detect virus. Improper specimen collection and handling, sequence variability under primers/probes, or organism present below the limit of detection may  lead to false negative results. Positive and negative predictive values of testing are highly dependent on prevalence. False negative test results are more likely when prevalence of disease is high.The expected result is NEGATIVE.Fact S                          heet for  Healthcare Providers: LocalChronicle.no Sheet for Patients: SalonLookup.es Reference Range - Negative   Hospital Outpatient Visit on 11/18/2020  Component Date Value Ref Range Status   MRSA, PCR 11/18/2020 NEGATIVE  NEGATIVE Final   Staphylococcus aureus 11/18/2020 NEGATIVE  NEGATIVE Final   Comment: (NOTE) The Xpert SA Assay (FDA approved for NASAL specimens in patients 77 years of age and older), is one component of a comprehensive surveillance program. It is not intended to diagnose infection nor to guide or monitor treatment. Performed at [facility], [place] [address] [place], [place] [address]    WBC 11/18/2020 7.6  4.0 - 10.5 K/uL Final   RBC 11/18/2020 4.64  3.87 - 5.11 MIL/uL Final   Hemoglobin 11/18/2020 14.6  12.0 - 15.0 g/dL Final   HCT 11/18/2020 44.0  36.0 - 46.0 % Final   MCV 11/18/2020 94.8  80.0 - 100.0 fL Final   MCH 11/18/2020 31.5  26.0 - 34.0 pg Final   MCHC 11/18/2020 33.2  30.0 - 36.0 g/dL Final   RDW 11/18/2020 11.8  11.5 - 15.5 % Final   Platelets 11/18/2020 316  150 - 400 K/uL Final   nRBC 11/18/2020 0.0  0.0 - 0.2 % Final   Performed at [facility], [place] [address], [place], [place] [address]   Sodium 11/18/2020 135  135 - 145 mmol/L Final   Potassium 11/18/2020 4.9  3.5 - 5.1 mmol/L Final   Chloride 11/18/2020 100  98 - 111 mmol/L Final   CO2 11/18/2020 26  22 - 32 mmol/L Final   Glucose, Bld  11/18/2020 106 (A)  70 - 99 mg/dL Final   Glucose reference range applies only to samples taken after fasting for at least 8 hours.   BUN 11/18/2020 23  8 - 23 mg/dL Final   Creatinine, Ser 11/18/2020 0.47  0.44 - 1.00 mg/dL Final   Calcium 11/18/2020 9.6  8.9 - 10.3 mg/dL Final   Total Protein 11/18/2020 6.7  6.5 - 8.1 g/dL Final   Albumin 11/18/2020 4.2  3.5 - 5.0 g/dL Final   AST 11/18/2020 22  15 - 41 U/L Final   ALT 11/18/2020 11  0 - 44 U/L Final   Alkaline Phosphatase 11/18/2020 68  38 - 126 U/L Final   Total  Bilirubin 11/18/2020 0.7  0.3 - 1.2 mg/dL Final   GFR, Estimated 11/18/2020 >60  >60 mL/min Final   Comment: (NOTE) Calculated using the CKD-EPI Creatinine Equation (2021)    Anion gap 11/18/2020 9  5 - 15 Final   Performed at [facility], [place] [address] [place], [place] [address]   Prothrombin Time 11/18/2020 14.6  11.4 - 15.2 seconds Final   INR 11/18/2020 1.1  0.8 - 1.2 Final   Comment: (NOTE) INR goal varies based on device and disease states. Performed at [facility], [place] [address] [place], [place] [address]      X-Rays:No results found.  EKG: Orders placed or performed during the hospital encounter of 07/29/20   EKG 12-Lead   EKG 12-Lead     Hospital Course: GUINEVERE STEPHENSON is a 75 y.o. who was admitted to [facility]. They were brought to the operating room on 11/28/2020 and underwent Procedure(s): TOTAL KNEE ARTHROPLASTY.  Patient tolerated the procedure well and was later transferred to the recovery room and then to the orthopaedic floor for postoperative care. They were given PO and IV analgesics for pain control following their surgery. They were given 24 hours of postoperative antibiotics of  Anti-infectives (From admission, onward)    Start     Dose/Rate Route Frequency Ordered Stop   11/28/20 1430  ceFAZolin (ANCEF) IVPB 2g/100 mL premix        2 g 200 mL/hr over 30 Minutes Intravenous Every 6 hours  11/28/20 1109 11/28/20 2244   11/28/20 0615  ceFAZolin (ANCEF) IVPB 2g/100 mL premix        2 g 200 mL/hr over 30 Minutes Intravenous On call to O.R. 11/28/20 2947 11/28/20 0839      and started on DVT prophylaxis in the form of  Eliquis .   PT and OT were ordered for total joint protocol. Discharge planning consulted to help with postop disposition and equipment needs.  Patient had a good night on the evening of surgery. They started to get up OOB with therapy on POD #0. Pt was seen during rounds and was ready to go home pending progress with therapy. She worked with therapy on POD #1 and was meeting her goals. Pt was discharged to home later that day in stable condition.  Diet: Cardiac diet Activity: WBAT Follow-up: in 2 weeks Disposition: Home with OPPT Discharged Condition: stable   Discharge Instructions     Call MD / Call 911   Complete by: As directed    If you experience chest pain or shortness of breath, CALL 911 and be transported to the hospital emergency room.  If you develope a fever above 101 F, pus (white drainage) or increased drainage or redness at the wound, or calf pain, call your surgeon's office.   Change dressing   Complete by: As directed    You may remove the bulky bandage (ACE wrap and gauze) two days after surgery. You will have an adhesive waterproof bandage underneath. Leave this in place until your first follow-up appointment.   Constipation Prevention   Complete by: As directed    Drink plenty of fluids.  Prune juice may be helpful.  You may use a stool softener, such as Colace (over the counter) 100 mg twice a day.  Use MiraLax (over the counter) for constipation as needed.   Diet - low sodium heart healthy   Complete by: As directed    Do not put a pillow  under the knee. Place it under the heel.   Complete by: As directed    Driving restrictions   Complete by: As directed    No driving for two weeks   Post-operative opioid taper instructions:    Complete by: As directed    POST-OPERATIVE OPIOID TAPER INSTRUCTIONS: It is important to wean off of your opioid medication as soon as possible. If you do not need pain medication after your surgery it is ok to stop day one. Opioids include: Codeine, Hydrocodone(Norco, Vicodin), Oxycodone(Percocet, oxycontin) and hydromorphone amongst others.  Long term and even short term use of opiods can cause: Increased pain response Dependence Constipation Depression Respiratory depression And more.  Withdrawal symptoms can include Flu like symptoms Nausea, vomiting And more Techniques to manage these symptoms Hydrate well Eat regular healthy meals Stay active Use relaxation techniques(deep breathing, meditating, yoga) Do Not substitute Alcohol to help with tapering If you have been on opioids for less than two weeks and do not have pain than it is ok to stop all together.  Plan to wean off of opioids This plan should start within one week post op of your joint replacement. Maintain the same interval or time between taking each dose and first decrease the dose.  Cut the total daily intake of opioids by one tablet each day Next start to increase the time between doses. The last dose that should be eliminated is the evening dose.      TED hose   Complete by: As directed    Use stockings (TED hose) for three weeks on both leg(s).  You may remove them at night for sleeping.   Weight bearing as tolerated   Complete by: As directed       Allergies as of 11/29/2020       Reactions   Oxycodone Other (See Comments)   Hallucinations        Medication List     STOP taking these medications    celecoxib 200 MG capsule Commonly known as: CeleBREX       TAKE these medications    acetaminophen 500 MG tablet Commonly known as: TYLENOL Take 1,000 mg by mouth every 6 (six) hours as needed for moderate pain or mild pain.   Align 4 MG Caps Take 4 mg by mouth daily.    cholecalciferol 25 MCG (1000 UNIT) tablet Commonly known as: VITAMIN D Take 1,000 Units by mouth daily.   diazepam 2 MG tablet Commonly known as: VALIUM Take 2 mg by mouth daily.   diltiazem 30 MG tablet Commonly known as: Cardizem Take 1 tablet every 4 hours AS NEEDED for HR >100 as long as top BP >100.   Eliquis 5 MG Tabs tablet Generic drug: apixaban Take 5 mg by mouth 2 (two) times daily.   gabapentin 300 MG capsule Commonly known as: NEURONTIN Take a 300 mg capsule three times a day for two weeks following surgery.Then take a 300 mg capsule two times a day for two weeks. Then take a 300 mg capsule once a day for two weeks. Then discontinue.   HYDROcodone-acetaminophen 5-325 MG tablet Commonly known as: NORCO/VICODIN Take 1-2 tablets by mouth every 6 (six) hours as needed for moderate pain or severe pain. What changed:  how much to take when to take this reasons to take this   loratadine 10 MG tablet Commonly known as: CLARITIN Take 10 mg by mouth daily as needed for allergies.   methocarbamol 500 MG tablet Commonly known as:  ROBAXIN Take 1 tablet (500 mg total) by mouth every 6 (six) hours as needed for muscle spasms.   SYSTANE OP Place 1 drop into both eyes 2 (two) times daily.   traMADol 50 MG tablet Commonly known as: ULTRAM Take 1-2 tablets (50-100 mg total) by mouth every 6 (six) hours as needed for moderate pain.   vitamin B-12 1000 MCG tablet Commonly known as: CYANOCOBALAMIN Take 1,000 mcg by mouth daily.   zolpidem 10 MG tablet Commonly known as: AMBIEN Take 10 mg by mouth at bedtime.               Discharge Care Instructions  (From admission, onward)           Start     Ordered   11/29/20 0000  Weight bearing as tolerated        11/29/20 0802   11/29/20 0000  Change dressing       Comments: You may remove the bulky bandage (ACE wrap and gauze) two days after surgery. You will have an adhesive waterproof bandage underneath. Leave  this in place until your first follow-up appointment.   11/29/20 0802            Follow-up Information     Gaynelle Arabian, MD. Go on 12/08/2020.   Specialty: Orthopedic Surgery Why: You are scheduled for a follow up appointment on 12-08-20 at 8:15 am at our [facility] location. Contact information: [address] [place] [address] [phone]                 Signed: Theresa Duty, PA-C Orthopedic Surgery 12/05/2020, 7:59 AM

## 2020-12-07 DIAGNOSIS — M25561 Pain in right knee: Secondary | ICD-10-CM | POA: Diagnosis not present

## 2020-12-09 DIAGNOSIS — M25561 Pain in right knee: Secondary | ICD-10-CM | POA: Diagnosis not present

## 2020-12-19 ENCOUNTER — Encounter: Payer: Self-pay | Admitting: Nurse Practitioner

## 2020-12-19 ENCOUNTER — Other Ambulatory Visit (HOSPITAL_COMMUNITY): Payer: Self-pay | Admitting: *Deleted

## 2020-12-19 DIAGNOSIS — M25561 Pain in right knee: Secondary | ICD-10-CM | POA: Diagnosis not present

## 2020-12-19 MED ORDER — ELIQUIS 5 MG PO TABS: 5.0000 mg | ORAL_TABLET | Freq: Two times a day (BID) | ORAL | 3 refills | Status: DC

## 2020-12-21 DIAGNOSIS — M48061 Spinal stenosis, lumbar region without neurogenic claudication: Secondary | ICD-10-CM | POA: Diagnosis not present

## 2020-12-21 DIAGNOSIS — Z96651 Presence of right artificial knee joint: Secondary | ICD-10-CM | POA: Diagnosis not present

## 2020-12-21 DIAGNOSIS — I480 Paroxysmal atrial fibrillation: Secondary | ICD-10-CM | POA: Diagnosis not present

## 2020-12-21 DIAGNOSIS — D6869 Other thrombophilia: Secondary | ICD-10-CM | POA: Diagnosis not present

## 2020-12-22 DIAGNOSIS — M25561 Pain in right knee: Secondary | ICD-10-CM | POA: Diagnosis not present

## 2020-12-22 DIAGNOSIS — Z471 Aftercare following joint replacement surgery: Secondary | ICD-10-CM | POA: Diagnosis not present

## 2020-12-22 DIAGNOSIS — Z96651 Presence of right artificial knee joint: Secondary | ICD-10-CM | POA: Diagnosis not present

## 2020-12-27 DIAGNOSIS — L820 Inflamed seborrheic keratosis: Secondary | ICD-10-CM | POA: Diagnosis not present

## 2020-12-27 DIAGNOSIS — L570 Actinic keratosis: Secondary | ICD-10-CM | POA: Diagnosis not present

## 2020-12-27 DIAGNOSIS — M25561 Pain in right knee: Secondary | ICD-10-CM | POA: Diagnosis not present

## 2020-12-27 DIAGNOSIS — D485 Neoplasm of uncertain behavior of skin: Secondary | ICD-10-CM | POA: Diagnosis not present

## 2020-12-27 DIAGNOSIS — D2261 Melanocytic nevi of right upper limb, including shoulder: Secondary | ICD-10-CM | POA: Diagnosis not present

## 2020-12-27 DIAGNOSIS — L814 Other melanin hyperpigmentation: Secondary | ICD-10-CM | POA: Diagnosis not present

## 2020-12-27 DIAGNOSIS — C44519 Basal cell carcinoma of skin of other part of trunk: Secondary | ICD-10-CM | POA: Diagnosis not present

## 2020-12-27 DIAGNOSIS — D2272 Melanocytic nevi of left lower limb, including hip: Secondary | ICD-10-CM | POA: Diagnosis not present

## 2020-12-27 DIAGNOSIS — Z85828 Personal history of other malignant neoplasm of skin: Secondary | ICD-10-CM | POA: Diagnosis not present

## 2020-12-27 DIAGNOSIS — Z85820 Personal history of malignant melanoma of skin: Secondary | ICD-10-CM | POA: Diagnosis not present

## 2020-12-29 DIAGNOSIS — R000 Tachycardia, unspecified: Secondary | ICD-10-CM | POA: Insufficient documentation

## 2020-12-29 DIAGNOSIS — Z96659 Presence of unspecified artificial knee joint: Secondary | ICD-10-CM | POA: Insufficient documentation

## 2020-12-29 DIAGNOSIS — D6869 Other thrombophilia: Secondary | ICD-10-CM | POA: Insufficient documentation

## 2020-12-29 DIAGNOSIS — R42 Dizziness and giddiness: Secondary | ICD-10-CM | POA: Insufficient documentation

## 2020-12-29 DIAGNOSIS — M25561 Pain in right knee: Secondary | ICD-10-CM | POA: Diagnosis not present

## 2020-12-29 DIAGNOSIS — D6859 Other primary thrombophilia: Secondary | ICD-10-CM | POA: Insufficient documentation

## 2020-12-30 ENCOUNTER — Other Ambulatory Visit: Payer: Self-pay

## 2020-12-30 ENCOUNTER — Ambulatory Visit (HOSPITAL_COMMUNITY)
Admission: RE | Admit: 2020-12-30 | Discharge: 2020-12-30 | Disposition: A | Discharge status: Home / Self Care | Payer: PPO | Source: Ambulatory Visit | Attending: Physician Assistant | Admitting: Physician Assistant

## 2020-12-30 VITALS — BP 112/60 | HR 74 | Ht 61.5 in | Wt 107.0 lb

## 2020-12-30 DIAGNOSIS — I480 Paroxysmal atrial fibrillation: Secondary | ICD-10-CM | POA: Insufficient documentation

## 2020-12-30 DIAGNOSIS — Z79899 Other long term (current) drug therapy: Secondary | ICD-10-CM | POA: Diagnosis not present

## 2020-12-30 DIAGNOSIS — D6869 Other thrombophilia: Secondary | ICD-10-CM | POA: Insufficient documentation

## 2020-12-30 DIAGNOSIS — Z7901 Long term (current) use of anticoagulants: Secondary | ICD-10-CM | POA: Insufficient documentation

## 2020-12-30 NOTE — Progress Notes (Signed)
Primary Care Physician: Ginger Organ., MD Primary Cardiologist: none Primary Electrophysiologist: none Referring Physician: Dr [name] is a 75 y.o. female with a history of new onset atrial fibrillation who presents for follow up in the [facility].  The patient was initially diagnosed with atrial fibrillation 06/21/20 on her Apple Watch (strips personally reviewed). Patient has a CHADS2VASC score of 3. She has had intermittent palpitaitons and dizziness infrequently for years. She denies significant snoring but does consume alcohol daily.   On follow up today, patient reports that she has done well from a cardiac standpoint. She has had brief episodes of afib about once per month. She has noted dark chocolate appears to be a trigger for her. She did have knee replacement surgery about one month ago and recovery is slowly progressing. She is on Eliquis without bleeding issues.   Today, she denies symptoms of chest pain, shortness of breath, orthopnea, PND, lower extremity edema, dizziness, presyncope, syncope, snoring, daytime somnolence, bleeding, or neurologic sequela. The patient is tolerating medications without difficulties and is otherwise without complaint today.    Atrial Fibrillation Risk Factors:  she does not have symptoms or diagnosis of sleep apnea. she does not have a history of rheumatic fever. she does have a history of alcohol use. The patient does have a history of early familial atrial fibrillation or other arrhythmias. Brother has afib.  she has a BMI of Body mass index is 19.89 kg/m.Marland Kitchen Filed Weights   12/30/20 0912  Weight: 48.5 kg     Family History  Problem Relation Age of Onset   Hypertension Mother    Dementia Mother    Hypertension Father    Diabetes Father    Heart disease Father    Heart failure Father    Parkinson's disease Brother    Breast cancer Neg Hx      Atrial Fibrillation Management  history:  Previous antiarrhythmic drugs: none Previous cardioversions: none Previous ablations: none CHADS2VASC score: 3 Anticoagulation history: Eliquis    Past Medical History:  Diagnosis Date   Abnormal glandular Papanicolaou smear of cervix 11/04/2014   Arthritis    Back pain    CIN I (cervical intraepithelial neoplasia I)    LEEP 2006 margins free      negative HR HPV 1914    Complication of anesthesia    Difficult intubation    needs pediatric equipment   Dysrhythmia    afib   Fever blister    Herniated nucleus pulposus, L5-S1 08/10/2014   Insomnia    Neck pain    Pseudoarthrosis of lumbar spine    Spasmodic dysphonia    Spondylolysis, 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 Sherren Mocha Early to co surgeon;  Surgeon: Kristeen Miss, MD;  Location: [facility];  Service: Neurosurgery;  Laterality: N/A;   BACK SURGERY     Fusion   CATARACT EXTRACTION W/ INTRAOCULAR LENS  IMPLANT, BILATERAL     CERVICAL BIOPSY  W/ LOOP ELECTRODE EXCISION  2006   CERVICAL CONIZATION W/BX N/A 12/28/2014   Procedure: CONIZATION CERVIX WITH BIOPSY;  Surgeon: Terrance Mass, MD;  Location: [facility];  Service: Gynecology;  Laterality: N/A;   COLONOSCOPY     COLPOSCOPY     DILATION AND CURETTAGE OF UTERUS     HARDWARE REMOVAL N/A 06/17/2018   Procedure: Removal of bilateral iliac fixation;  Surgeon: Kristeen Miss, MD;  Location: [facility];  Service: Neurosurgery;  Laterality: N/A;  Removal of bilateral iliac fixation   HYSTEROSCOPY WITH D & C N/A 12/28/2014   Procedure: DILATATION AND CURETTAGE /HYSTEROSCOPY Diagnostic Hysteroscopy;  Surgeon: Terrance Mass, MD;   Location: [facility];  Service: Gynecology;  Laterality: N/A;   JOINT REPLACEMENT     Left knee   KNEE SURGERY     Rt-95,Lft.-98,Replacement-09   LUMBAR LAMINECTOMY/DECOMPRESSION MICRODISCECTOMY Bilateral 08/10/2014   Procedure: Bilateral Lumbar five-Sacral one Diskectomy;  Surgeon: Kristeen Miss, MD;  Location: [facility];  Service: Neurosurgery;  Laterality: Bilateral;  Bilateral L5-S1 Diskectomy   ROTATOR CUFF REPAIR  2004   TONSILLECTOMY     TOTAL KNEE ARTHROPLASTY Right 11/28/2020   Procedure: TOTAL KNEE ARTHROPLASTY;  Surgeon: Gaynelle Arabian, MD;  Location: [facility];  Service: Orthopedics;  Laterality: Right;   TUBAL LIGATION      Current Outpatient Medications  Medication Sig Dispense Refill   acetaminophen (TYLENOL) 500 MG tablet Take 1,000 mg by mouth every 6 (six) hours as needed for moderate pain or mild pain.     cholecalciferol (VITAMIN D) 25 MCG (1000 UNIT) tablet Take 1,000 Units by mouth daily.     diazepam (VALIUM) 5 MG tablet daily. Taking 5 mg by mouth once daily     diltiazem (CARDIZEM) 30 MG tablet Take 1 tablet every 4 hours AS NEEDED for HR >100 as long as top BP >100. 30 tablet 1   ELIQUIS 5 MG TABS tablet Take 1 tablet (5 mg total) by mouth 2 (two) times daily. 60 tablet 3   HYDROcodone-acetaminophen (NORCO/VICODIN) 5-325 MG tablet Take 1-2 tablets by mouth every 6 (six) hours as needed for moderate pain or severe pain. 42 tablet 0   loratadine (CLARITIN) 10 MG tablet Take 10 mg by mouth daily as needed for allergies.     Polyethyl Glycol-Propyl Glycol (SYSTANE OP) Place 1 drop into both eyes 2 (two) times daily.     Probiotic Product (ALIGN) 4 MG CAPS Take 4 mg by mouth daily.     traMADol (ULTRAM) 50 MG tablet Take 1-2 tablets (50-100 mg total) by mouth every 6 (six) hours as needed for moderate pain. 40 tablet 0   vitamin B-12 (CYANOCOBALAMIN) 1000 MCG tablet Take 1,000 mcg by mouth daily.     zolpidem (AMBIEN) 10 MG tablet Take 10 mg by mouth at bedtime.   0   No  current facility-administered medications for this encounter.    Allergies  Allergen Reactions   Oxycodone Other (See Comments)    Hallucinations    Social History   Socioeconomic History   Marital status: Married    Spouse name: Not on file   Number of children: Not on file   Years of education: Not on file   Highest education level: Not on file  Occupational History   Not on file  Tobacco Use   Smoking status: Never   Smokeless tobacco: Never  Vaping Use   Vaping Use: Never used  Substance and Sexual Activity   Alcohol use: Yes    Alcohol/week: 14.0 standard drinks    Types: 14 Standard drinks or equivalent per week    Comment: daily drink   Drug  use: No   Sexual activity: Yes    Birth control/protection: Post-menopausal, Surgical    Comment: INSURANCE QUESTIONS DECLINED  Other Topics Concern   Not on file  Social History Narrative   Not on file   Social Determinants of Health   Financial Resource Strain: Not on file  Food Insecurity: Not on file  Transportation Needs: Not on file  Physical Activity: Not on file  Stress: Not on file  Social Connections: Not on file  Intimate Partner Violence: Not on file     ROS- All systems are reviewed and negative except as per the HPI above.  Physical Exam: Vitals:   12/30/20 0912  BP: 112/60  Pulse: 74  Weight: 48.5 kg  Height: 5' 1.5" (1.562 m)    GEN- The patient is a well appearing elderly female, alert and oriented x 3 today.   HEENT-head normocephalic, atraumatic, sclera clear, conjunctiva pink, hearing intact, trachea midline. Lungs- Clear to ausculation bilaterally, normal work of breathing Heart- Regular rate and rhythm, no murmurs, rubs or gallops  GI- soft, NT, ND, + BS Extremities- no clubbing, cyanosis, or edema MS- no significant deformity or atrophy Skin- no rash or lesion Psych- euthymic mood, full affect Neuro- strength and sensation are intact   Wt Readings from Last 3 Encounters:   12/30/20 48.5 kg  11/28/20 48.1 kg  11/18/20 48.1 kg    EKG today demonstrates  SR Vent. rate 74 BPM PR interval 174 ms QRS duration 76 ms QT/QTcB 378/419 ms  Echo 07/28/20 demonstrated  1. Left ventricular ejection fraction, by estimation, is 60 to 65%. The  left ventricle has normal function. The left ventricle has no regional  wall motion abnormalities. Left ventricular diastolic parameters were  normal.   2. Right ventricular systolic function is normal. The right ventricular  size is normal. There is normal pulmonary artery systolic pressure. The  estimated right ventricular systolic pressure is 83.4 mmHg.   3. The mitral valve is normal in structure. Trivial mitral valve  regurgitation.   4. The aortic valve was not well visualized. Aortic valve regurgitation  is not visualized. No aortic stenosis is present.   5. The inferior vena cava is dilated in size with >50% respiratory  variability, suggesting right atrial pressure of 8 mmHg.   Epic records are reviewed at length today  CHA2DS2-VASc Score = 3  The patient's score is based upon: CHF History: 0 HTN History: 0 Diabetes History: 0 Stroke History: 0 Vascular Disease History: 0 Age Score: 2 Gender Score: 1       ASSESSMENT AND PLAN: 1. Paroxysmal Atrial Fibrillation (ICD10:  I48.0) The patient's CHA2DS2-VASc score is 3, indicating a 3.2% annual risk of stroke.   We discussed rhythm control options. She does not feel her present frequency of symptoms warrant any other intervention right now. Would consider front-line ablation if needed. She does have a propensity to become bradycardic.  Continue Eliquis 5 mg BID Apple Watch for home monitoring. Continue diltiazem 30 mg PRN q 4 hours for hear racing.  2. Secondary Hypercoagulable State (ICD10:  D68.69) The patient is at significant risk for stroke/thromboembolism based upon her CHA2DS2-VASc Score of 3.  Continue Apixaban (Eliquis).    Follow up in the [facility]  clinic in 6 months.    [facility] [address] [place], [place] [address] [phone] 12/30/2020 9:43 AM

## 2021-01-02 ENCOUNTER — Ambulatory Visit: Payer: PPO | Admitting: Nurse Practitioner

## 2021-01-02 NOTE — Progress Notes (Deleted)
01/02/2021 Catherine [name] [id number] 03-Jun-1945   CHIEF COMPLAINT:   HISTORY OF PRESENT ILLNESS:  Catherine [name]. [name] is a 75 year old female with a past medical history of osteoarthritis, back pain, atrial fibrillation 05/2020 She presents to our office today   ANEMIA s/p right knee replacement surgery 11/28/2020 on Eliquis   Colonoscopy 01/19/2010 by Dr. Olevia Perches:  1) Mild diverticulosis in the sigmoid colon  2) Otherwise normal examination  Colonoscopy 07/25/1999: Left sided diverticulosis, no polyps   DIFFICULT INTUBATION PROCEDURES MUST BE AT HOSPITAL  CBC Latest Ref Rng & Units 11/29/2020 11/18/2020 07/29/2020  WBC 4.0 - 10.5 K/uL 8.7 7.6 5.6  Hemoglobin 12.0 - 15.0 g/dL 11.0(L) 14.6 14.3  Hematocrit 36.0 - 46.0 % 31.8(L) 44.0 43.0  Platelets 150 - 400 K/uL 225 316 321    CMP Latest Ref Rng & Units 11/29/2020 11/18/2020 07/29/2020  Glucose 70 - 99 mg/dL 142(H) 106(H) 98  BUN 8 - 23 mg/dL 10 23 21   Creatinine 0.44 - 1.00 mg/dL 0.48 0.47 0.50  Sodium 135 - 145 mmol/L 138 135 135  Potassium 3.5 - 5.1 mmol/L 4.3 4.9 4.3  Chloride 98 - 111 mmol/L 107 100 104  CO2 22 - 32 mmol/L 25 26 27   Calcium 8.9 - 10.3 mg/dL 8.4(L) 9.6 9.4  Total Protein 6.5 - 8.1 g/dL - 6.7 -  Total Bilirubin 0.3 - 1.2 mg/dL - 0.7 -  Alkaline Phos 38 - 126 U/L - 68 -  AST 15 - 41 U/L - 22 -  ALT 0 - 44 U/L - 11 -    ECHO 07/28/2020: 1. Left ventricular ejection fraction, by estimation, is 60 to 65%. The left ventricle has normal function. The left ventricle has no regional wall motion abnormalities. Left ventricular diastolic parameters were normal. 2. Right ventricular systolic function is normal. The right ventricular size is normal. There is normal pulmonary artery systolic pressure. The estimated right ventricular systolic pressure is 62.9 mmHg. 3. The mitral valve is normal in structure. Trivial mitral valve regurgitation. 4. The aortic valve was not well visualized. Aortic valve regurgitation  is not visualized. No aortic stenosis is present. 5. The inferior vena cava is dilated in size with >50% respiratory variability, suggesting right atrial pressure of 8 mmHg.  Past Medical History:  Diagnosis Date   Abnormal glandular Papanicolaou smear of cervix 11/04/2014   Arthritis    Back pain    CIN I (cervical intraepithelial neoplasia I)    LEEP 2006 margins free      negative HR HPV [id number]    Complication of anesthesia    Difficult intubation    needs pediatric equipment   Dysrhythmia    afib   Fever blister    Herniated nucleus pulposus, L5-S1 08/10/2014   Insomnia    Neck pain    Pseudoarthrosis of lumbar spine    Spasmodic dysphonia    Spondylolysis, 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 Sherren Mocha Early to co surgeon;  Surgeon: Kristeen Miss, MD;  Location: [facility];  Service: Neurosurgery;  Laterality: N/A;   BACK SURGERY     Fusion   CATARACT EXTRACTION W/ INTRAOCULAR LENS  IMPLANT, BILATERAL     CERVICAL BIOPSY  W/ LOOP ELECTRODE EXCISION  2006   CERVICAL CONIZATION W/BX N/A 12/28/2014   Procedure: CONIZATION CERVIX WITH BIOPSY;  Surgeon: Terrance Mass, MD;  Location: [facility];  Service: Gynecology;  Laterality: N/A;   COLONOSCOPY     COLPOSCOPY     DILATION AND CURETTAGE OF UTERUS     HARDWARE REMOVAL N/A 06/17/2018   Procedure: Removal of bilateral iliac fixation;  Surgeon: Kristeen Miss, MD;  Location: [facility];  Service: Neurosurgery;  Laterality: N/A;  Removal of bilateral iliac fixation   HYSTEROSCOPY WITH D & C N/A 12/28/2014   Procedure: DILATATION AND CURETTAGE /HYSTEROSCOPY Diagnostic Hysteroscopy;  Surgeon: Terrance Mass, MD;  Location: [facility];  Service: Gynecology;  Laterality: N/A;   JOINT REPLACEMENT     Left knee   KNEE SURGERY     Rt-95,Lft.-98,Replacement-09   LUMBAR LAMINECTOMY/DECOMPRESSION MICRODISCECTOMY Bilateral 08/10/2014   Procedure: Bilateral Lumbar five-Sacral one Diskectomy;  Surgeon: Kristeen Miss, MD;  Location: [facility];  Service: Neurosurgery;  Laterality: Bilateral;  Bilateral L5-S1 Diskectomy   ROTATOR CUFF REPAIR  2004   TONSILLECTOMY     TOTAL KNEE ARTHROPLASTY Right 11/28/2020   Procedure: TOTAL KNEE ARTHROPLASTY;  Surgeon: Gaynelle Arabian, MD;  Location: [facility];  Service: Orthopedics;  Laterality: Right;   TUBAL LIGATION     Social History:  Family History:    reports that she has never smoked. She has never used smokeless tobacco. She reports current alcohol use of about 14.0 standard drinks per week. She reports that she does not use drugs. family history includes Dementia in her mother; Diabetes in her father; Heart disease in her father; Heart failure in her father; Hypertension in her father and mother; Parkinson's disease in her brother.      Outpatient Encounter Medications as of 01/02/2021  Medication Sig   acetaminophen (TYLENOL) 500 MG tablet Take 1,000 mg by mouth every 6 (six) hours as needed for moderate pain or mild pain.   cholecalciferol (VITAMIN D) 25 MCG (1000 UNIT) tablet Take 1,000 Units by mouth daily.   diazepam (VALIUM) 5 MG tablet daily. Taking 5 mg by mouth once daily   diltiazem (CARDIZEM) 30 MG tablet Take 1 tablet every 4 hours AS NEEDED for HR >100 as long as top BP >100.   ELIQUIS 5 MG TABS tablet Take 1 tablet (5 mg total) by mouth 2 (two) times daily.   HYDROcodone-acetaminophen (NORCO/VICODIN) 5-325 MG tablet Take 1-2 tablets by mouth every 6 (six) hours as needed for moderate pain or severe pain.   loratadine (CLARITIN) 10 MG tablet Take 10 mg by mouth daily as needed for allergies.   Polyethyl Glycol-Propyl Glycol (SYSTANE OP)  Place 1 drop into both eyes 2 (two) times daily.   Probiotic Product (ALIGN) 4 MG CAPS Take 4 mg by mouth daily.   traMADol (ULTRAM) 50 MG tablet Take 1-2 tablets (50-100 mg total) by mouth every 6 (six) hours as needed for moderate pain.   vitamin B-12 (CYANOCOBALAMIN) 1000 MCG tablet Take 1,000 mcg by mouth daily.   zolpidem (AMBIEN) 10 MG tablet Take 10 mg by mouth at bedtime.    No facility-administered encounter medications on file as of 01/02/2021.     REVIEW OF SYSTEMS:  Gen: Denies fever, sweats or chills. No weight loss.  CV: Denies chest pain, palpitations or edema. Resp: Denies cough, shortness of breath of hemoptysis.  GI: Denies heartburn, dysphagia, stomach or lower abdominal pain.  No diarrhea or constipation.  GU : Denies urinary burning, blood in urine, increased urinary frequency or incontinence. MS: Denies joint pain, muscles aches or weakness. Derm: Denies rash, itchiness, skin lesions or unhealing ulcers. Psych: Denies depression, anxiety, memory loss, suicidal ideation and confusion. Heme: Denies bruising, bleeding. Neuro:  Denies headaches, dizziness or paresthesias. Endo:  Denies any problems with DM, thyroid or adrenal function.   PHYSICAL EXAM: There were no vitals taken for this visit. General: Well developed ... in no acute distress. Head: Normocephalic and atraumatic. Eyes:  Sclerae non-icteric, conjunctive pink. Ears: Normal auditory acuity. Mouth: Dentition intact. No ulcers or lesions.  Neck: Supple, no lymphadenopathy or thyromegaly.  Lungs: Clear bilaterally to auscultation without wheezes, crackles or rhonchi. Heart: Regular rate and rhythm. No murmur, rub or gallop appreciated.  Abdomen: Soft, nontender, non distended. No masses. No hepatosplenomegaly. Normoactive bowel sounds x 4 quadrants.  Rectal:  Musculoskeletal: Symmetrical with no gross deformities. Skin: Warm and dry. No rash or lesions on visible extremities. Extremities: No  edema. Neurological: Alert oriented x 4, no focal deficits.  Psychological:  Alert and cooperative. Normal mood and affect.  ASSESSMENT AND PLAN:  Colon cancer screening. -Colonoscopy at [facility] (history of difficult intubation)  Paroxysmal Afib CHADS2VASC score of 3. Normal LV EF.     CC:  Ginger Organ., MD

## 2021-01-04 DIAGNOSIS — M25561 Pain in right knee: Secondary | ICD-10-CM | POA: Diagnosis not present

## 2021-01-06 DIAGNOSIS — M25561 Pain in right knee: Secondary | ICD-10-CM | POA: Diagnosis not present

## 2021-01-10 DIAGNOSIS — M25561 Pain in right knee: Secondary | ICD-10-CM | POA: Diagnosis not present

## 2021-01-12 DIAGNOSIS — M25561 Pain in right knee: Secondary | ICD-10-CM | POA: Diagnosis not present

## 2021-01-17 DIAGNOSIS — M25561 Pain in right knee: Secondary | ICD-10-CM | POA: Diagnosis not present

## 2021-01-19 DIAGNOSIS — M25561 Pain in right knee: Secondary | ICD-10-CM | POA: Diagnosis not present

## 2021-01-25 ENCOUNTER — Other Ambulatory Visit (INDEPENDENT_AMBULATORY_CARE_PROVIDER_SITE_OTHER): Payer: PPO

## 2021-01-25 ENCOUNTER — Encounter: Payer: Self-pay | Admitting: Nurse Practitioner

## 2021-01-25 ENCOUNTER — Ambulatory Visit: Payer: PPO | Admitting: Nurse Practitioner

## 2021-01-25 ENCOUNTER — Telehealth: Payer: Self-pay

## 2021-01-25 VITALS — BP 106/76 | HR 77 | Ht 61.5 in | Wt 105.0 lb

## 2021-01-25 DIAGNOSIS — D649 Anemia, unspecified: Secondary | ICD-10-CM

## 2021-01-25 DIAGNOSIS — Z1211 Encounter for screening for malignant neoplasm of colon: Secondary | ICD-10-CM

## 2021-01-25 DIAGNOSIS — M25561 Pain in right knee: Secondary | ICD-10-CM | POA: Diagnosis not present

## 2021-01-25 LAB — CBC
HCT: 40.8 % (ref 36.0–46.0)
Hemoglobin: 13.7 g/dL (ref 12.0–15.0)
MCHC: 33.5 g/dL (ref 30.0–36.0)
MCV: 91.5 fl (ref 78.0–100.0)
Platelets: 291 10*3/uL (ref 150.0–400.0)
RBC: 4.46 Mil/uL (ref 3.87–5.11)
RDW: 12.2 % (ref 11.5–15.5)
WBC: 5 10*3/uL (ref 4.0–10.5)

## 2021-01-25 NOTE — Progress Notes (Addendum)
01/25/2021 FLEURETTE WOOLBRIGHT [id number] [birth year]   CHIEF COMPLAINT: Schedule a colonoscopy   HISTORY OF PRESENT ILLNESS: Barnabas Harries. Perman is a 76 year old female with a past medical history of arthritis, paroxysmal atrial fibrillation initially diagnosed 05/2020 on Eliquis. S/P total right knee replacement surgery 11/28/2020. She presents to our office today as referred by Dr. Sable Feil to schedule a screening colonoscopy.  Her husband is a patient of Dr. Blanch Media and she request to have Dr. Henrene Pastor as her primary gastroenterologist.  She denies having any upper or lower abdominal pain.  She is passing a normal formed brown bowel movement daily.  No rectal bleeding or black stools.  She takes align probiotic once daily.  No known family history of colon polyps or colorectal cancer.  Her father has significant diverticular disease.  She underwent a colonoscopy 07/25/1999 and 01/19/2010 which showed left-sided diverticulosis, no polyps.  She previously took Celebrex chronically for arthritis which was discontinued after she was placed on Eliquis for atrial fibrillation.  No chest pain, palpitations or shortness of breath.  No GERD symptoms.  Her most recent laboratory studies were completed one day post right knee total replacement surgery 11/29/2020 which showed a hemoglobin level of 11 (base line Hg 14.6).   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     CBC Latest Ref Rng & Units 11/29/2020 11/18/2020 07/29/2020  WBC 4.0 - 10.5 K/uL 8.7 7.6 5.6  Hemoglobin 12.0 - 15.0 g/dL 11.0(L) 14.6 14.3  Hematocrit 36.0 - 46.0 % 31.8(L) 44.0 43.0  Platelets 150 - 400 K/uL 225 316 321  MCV 92.7.  CMP Latest Ref Rng & Units 11/29/2020 11/18/2020 07/29/2020  Glucose 70 - 99 mg/dL 142(H) 106(H) 98  BUN 8 - 23 mg/dL 10 23 21   Creatinine 0.44 - 1.00 mg/dL 0.48  0.47 0.50  Sodium 135 - 145 mmol/L 138 135 135  Potassium 3.5 - 5.1 mmol/L 4.3 4.9 4.3  Chloride 98 - 111 mmol/L 107 100 104  CO2 22 - 32 mmol/L 25 26 27   Calcium 8.9 - 10.3 mg/dL 8.4(L) 9.6 9.4  Total Protein 6.5 - 8.1 g/dL - 6.7 -  Total Bilirubin 0.3 - 1.2 mg/dL - 0.7 -  Alkaline Phos 38 - 126 U/L - 68 -  AST 15 - 41 U/L - 22 -  ALT 0 - 44 U/L - 11 -    ECHO 07/28/2020: 1. Left ventricular ejection fraction, by estimation, is 60 to 65%. The left ventricle has normal function. The left ventricle has no regional wall motion abnormalities. Left ventricular diastolic parameters were normal. 2. Right ventricular systolic function is normal. The right ventricular size is normal. There is normal pulmonary artery systolic pressure. The estimated right ventricular systolic pressure is 98.3 mmHg. 3. The mitral valve is normal in structure. Trivial mitral valve regurgitation. 4. The aortic valve was not well visualized. Aortic valve regurgitation is not visualized. No aortic stenosis is present. 5. The inferior vena cava is dilated in size with >50% respiratory variability, suggesting right atrial pressure of 8 mmHg.  Colonoscopy 01/19/2010 by Dr. Olevia Perches: Sigmoid diverticulosis No polyps  Colonoscopy 07/25/1999: Left colon diverticulosis No polyps  Past Medical History:  Diagnosis Date   Abnormal glandular Papanicolaou smear of cervix 11/04/2014   Arthritis    Back pain    CIN I (  cervical intraepithelial neoplasia I)    LEEP 2006 margins free      negative HR HPV [id number]    Complication of anesthesia    Difficult intubation    needs pediatric equipment   Dysrhythmia    afib   Fever blister    Herniated nucleus pulposus, L5-S1 08/10/2014   Insomnia    Neck pain    Pseudoarthrosis of lumbar spine    Spasmodic dysphonia    Spondylolysis, lumbosacral 08/13/2014   Spondylitic Stenosis   Past Surgical History:  Procedure Laterality Date   ABDOMINAL EXPOSURE N/A 01/23/2017    Procedure: ABDOMINAL EXPOSURE;  Surgeon: Rosetta Posner, MD;  Location: [facility];  Service: Vascular;  Laterality: N/A;   ANTERIOR LUMBAR FUSION N/A 01/23/2017   Procedure: Revision of Lumbar five-Sacral One Fusion with Anterior lumbar interbody fusion, Dr. Sherren Mocha Early to co surgeon;  Surgeon: Kristeen Miss, MD;  Location: [facility];  Service: Neurosurgery;  Laterality: N/A;   BACK SURGERY     Fusion   CATARACT EXTRACTION W/ INTRAOCULAR LENS  IMPLANT, BILATERAL     CERVICAL BIOPSY  W/ LOOP ELECTRODE EXCISION  2006   CERVICAL CONIZATION W/BX N/A 12/28/2014   Procedure: CONIZATION CERVIX WITH BIOPSY;  Surgeon: Terrance Mass, MD;  Location: [facility];  Service: Gynecology;  Laterality: N/A;   COLONOSCOPY     COLPOSCOPY     DILATION AND CURETTAGE OF UTERUS     HARDWARE REMOVAL N/A 06/17/2018   Procedure: Removal of bilateral iliac fixation;  Surgeon: Kristeen Miss, MD;  Location: [facility];  Service: Neurosurgery;  Laterality: N/A;  Removal of bilateral iliac fixation   HYSTEROSCOPY WITH D & C N/A 12/28/2014   Procedure: DILATATION AND CURETTAGE /HYSTEROSCOPY Diagnostic Hysteroscopy;  Surgeon: Terrance Mass, MD;  Location: [facility];  Service: Gynecology;  Laterality: N/A;   JOINT REPLACEMENT     Left knee   KNEE SURGERY     Rt-95,Lft.-98,Replacement-09   LUMBAR LAMINECTOMY/DECOMPRESSION MICRODISCECTOMY Bilateral 08/10/2014   Procedure: Bilateral Lumbar five-Sacral one Diskectomy;  Surgeon: Kristeen Miss, MD;  Location: [facility];  Service: Neurosurgery;  Laterality: Bilateral;  Bilateral L5-S1 Diskectomy   ROTATOR CUFF REPAIR  2004   TONSILLECTOMY     TOTAL KNEE ARTHROPLASTY Right 11/28/2020   Procedure: TOTAL KNEE ARTHROPLASTY;  Surgeon: Gaynelle Arabian, MD;  Location: [facility];  Service: Orthopedics;  Laterality: Right;   TUBAL LIGATION     Social History: She is married.  She is a retired Pharmacist, hospital.  She has 2 sons and 2 daughters.  She drinks one Vodka mixed drink daily and two on the weekends. Nonsmoker. No  drug use.   Family History: Father had diverticulitis, diabetes and heart disease, deceased age 74.  Mother with history of dementia.  Brother with history of Parkinson's disease.   Allergies  Allergen Reactions   Oxycodone Other (See Comments)    Hallucinations      Outpatient Encounter Medications as of 01/25/2021  Medication Sig   acetaminophen (TYLENOL) 500 MG tablet Take 1,000 mg by mouth every 6 (six) hours as needed for moderate pain or mild pain.   cholecalciferol (VITAMIN D) 25 MCG (1000 UNIT) tablet Take 1,000 Units by mouth daily.   diazepam (VALIUM) 5 MG tablet daily. Taking 5 mg by mouth once daily   diltiazem (CARDIZEM) 30 MG tablet Take 1 tablet every 4 hours AS NEEDED for HR >100 as long as top BP >100.   ELIQUIS 5 MG TABS tablet Take 1 tablet (  5 mg total) by mouth 2 (two) times daily.   HYDROcodone-acetaminophen (NORCO/VICODIN) 5-325 MG tablet Take 1-2 tablets by mouth every 6 (six) hours as needed for moderate pain or severe pain.   loratadine (CLARITIN) 10 MG tablet Take 10 mg by mouth daily as needed for allergies.   Polyethyl Glycol-Propyl Glycol (SYSTANE OP) Place 1 drop into both eyes 2 (two) times daily.   Probiotic Product (ALIGN) 4 MG CAPS Take 4 mg by mouth daily.   traMADol (ULTRAM) 50 MG tablet Take 1-2 tablets (50-100 mg total) by mouth every 6 (six) hours as needed for moderate pain.   vitamin B-12 (CYANOCOBALAMIN) 1000 MCG tablet Take 1,000 mcg by mouth daily.   zolpidem (AMBIEN) 10 MG tablet Take 10 mg by mouth at bedtime.    No facility-administered encounter medications on file as of 01/25/2021.   REVIEW OF SYSTEMS:  Gen: Denies fever, sweats or chills. No weight loss.  CV: Denies chest pain, palpitations or edema. Resp: Denies cough, shortness of breath of hemoptysis.  GI: See HPI.   GU : Denies urinary burning, blood in urine, increased urinary frequency or incontinence. MS: Arthritis and back pain. Derm: Denies rash, itchiness, skin lesions or  unhealing ulcers. Psych: Denies depression, anxiety, memory loss, suicidal ideation and confusion. Heme: Denies bruising, bleeding. Neuro:  Denies headaches, dizziness or paresthesias. Endo:  Denies any problems with DM, thyroid or adrenal function.  PHYSICAL EXAM: BP 106/76   Pulse 77   Ht 5' 1.5" (1.562 m)   Wt 105 lb (47.6 kg)   SpO2 98%   BMI 19.52 kg/m   General: Very pleasant 76 year old female no acute distress. Head: Normocephalic and atraumatic. Eyes:  Sclerae non-icteric, conjunctive pink. Ears: Normal auditory acuity. Mouth: Dentition intact. No ulcers or lesions.  Neck: Supple, no lymphadenopathy or thyromegaly.  Lungs: Clear bilaterally to auscultation without wheezes, crackles or rhonchi. Heart: Regular rate and rhythm. No murmur, rub or gallop appreciated.  Abdomen: Soft, nontender, non distended. No masses. No hepatosplenomegaly. Normoactive bowel sounds x 4 quadrants.  Rectal: Deferred. Musculoskeletal: Symmetrical with no gross deformities. Skin: Warm and dry. No rash or lesions on visible extremities. Extremities: No edema. Neurological: Alert oriented x 4, no focal deficits.  Psychological:  Alert and cooperative. Normal mood and affect.  ASSESSMENT AND PLAN:  41) 76 year old female presents to schedule a screening colonoscopy.  Colonoscopy in 2001 and 2011 showed left-sided diverticulosis, no polyps.  No known family history of colorectal cancer. 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 Last Botox injection was 11/04/2020.  Further recommendations to be determined after colonoscopy completed   ADDENDUM: Colonoscopy scheduled with Dr. Henrene Pastor at [facility] on 02/13/2021, see Dr. Blanch Media addendum to office note.  Eliquis to be held for 2 days prior to colonoscopy as verified by cardiology PA-C Almyra Deforest and clinical pharmacist Megan Supple. Melissa CMA contacted patient with Eliquis hold instructions.   Patient was seen by cardiologist Dr. Burt Knack 01/26/2021 to  consider Watchman implantation to avoid long-term anticoagulation in the setting of advanced arthritis and need to take NSAIDs. I communicated with Almyra Deforest cardiology PA-C and he cleared patient for colonoscopy. Isaac Laud consulted with Dr. Burt Knack who further verified patient ok to proceed with a colonoscopy prior to undergoing Watchman surgery.     CC:  Ginger Organ., MD

## 2021-01-25 NOTE — Patient Instructions (Addendum)
If you are age 76 or older, your body mass index should be between 23-30. Your Body mass index is 19.52 kg/m. If this is out of the aforementioned range listed, please consider follow up with your Primary Care Provider.  The  GI providers would like to encourage you to use [facility] to communicate with providers for non-urgent requests or questions.  Due to long hold times on the telephone, sending your provider a message by [facility] may be faster and more efficient way to get a response. Please allow 48 business hours for a response.  Please remember that this is for non-urgent requests/questions.  LABS:  Lab work has been ordered for you today. Our lab is located in the basement. Press "B" on the elevator. The lab is located at the first door on the left as you exit the elevator.  HEALTHCARE LAWS AND MY CHART RESULTS: Due to recent changes in healthcare laws, you may see the results of your imaging and laboratory studies on MyChart before your provider has had a chance to review them.   We understand that in some cases there may be results that are confusing or concerning to you. Not all laboratory results come back in the same time frame and the provider may be waiting for multiple results in order to interpret others.  Please give [place] 48 hours in order for your provider to thoroughly review all the results before contacting the office for clarification of your results.   We will contact you regarding getting the colonoscopy scheduled.  It was great seeing you today! Thank you for entrusting me with your care and choosing [facility].  Noralyn Pick, CRNP

## 2021-01-25 NOTE — Telephone Encounter (Signed)
Request for surgical clearance:     Endoscopy Procedure  What type of surgery is being performed?     Colonoscopy  When is this surgery scheduled?     TBD  What type of clearance is required ?   Pharmacy  Are there any medications that need to be held prior to surgery and how long? Eliquis 2 day hold  Practice name and name of physician performing surgery?      [facility]  What is your office phone and fax number?      Phone- [phone]  [phone]  Anesthesia type (None, local, MAC, general) ?       MAC

## 2021-01-25 NOTE — Progress Notes (Signed)
Thank you Colleen.  Reviewed. 1.  Mr. Catherine [name] reviewed her case.  She is NOT a difficult intubation.  Thus she can be scheduled at the [facility]. 2.  She should hold her Eliquis 2 days preprocedure with plans for resumption immediately post procedure, pending findings. 3.  I have shared this information with her husband (Dr. Kellie Simmering). Thanks J. P.

## 2021-01-25 NOTE — Telephone Encounter (Signed)
Clinical pharmacist to review Eliquis 

## 2021-01-26 ENCOUNTER — Telehealth: Payer: Self-pay

## 2021-01-26 ENCOUNTER — Encounter: Payer: Self-pay | Admitting: Cardiovascular Disease

## 2021-01-26 ENCOUNTER — Ambulatory Visit: Payer: PPO | Admitting: Cardiovascular Disease

## 2021-01-26 ENCOUNTER — Other Ambulatory Visit: Payer: Self-pay

## 2021-01-26 VITALS — BP 112/80 | HR 85 | Ht 61.5 in | Wt 106.2 lb

## 2021-01-26 DIAGNOSIS — I480 Paroxysmal atrial fibrillation: Secondary | ICD-10-CM

## 2021-01-26 LAB — BASIC METABOLIC PANEL
BUN/Creatinine Ratio: 33 — ABNORMAL HIGH (ref 12–28)
BUN: 21 mg/dL (ref 8–27)
CO2: 26 mmol/L (ref 20–29)
Calcium: 9.8 mg/dL (ref 8.7–10.3)
Chloride: 100 mmol/L (ref 96–106)
Creatinine, Ser: 0.63 mg/dL (ref 0.57–1.00)
Glucose: 104 mg/dL — ABNORMAL HIGH (ref 70–99)
Potassium: 5.1 mmol/L (ref 3.5–5.2)
Sodium: 139 mmol/L (ref 134–144)
eGFR: 92 mL/min/{1.73_m2} (ref >59)

## 2021-01-26 LAB — IRON,TIBC AND FERRITIN PANEL
%SAT: 26 % (calc) (ref 16–45)
Ferritin: 150 ng/mL (ref 16–288)
Iron: 69 ug/dL (ref 45–160)
TIBC: 264 mcg/dL (calc) (ref 250–450)

## 2021-01-26 MED ORDER — METOPROLOL TARTRATE 50 MG PO TABS: 50.0000 mg | ORAL_TABLET | Freq: Once | ORAL | 0 refills | Status: DC

## 2021-01-26 NOTE — Patient Instructions (Addendum)
Medication Instructions:  Your physician recommends that you continue on your current medications as directed. Please refer to the Current Medication list given to you today.  *If you need a refill on your cardiac medications before your next appointment, please call your pharmacy*   Lab Work: TODAY: BMET If you have labs (blood work) drawn today and your tests are completely normal, you will receive your results only by: [place] (if you have MyChart) OR A paper copy in the mail If you have any lab test that is abnormal or we need to change your treatment, we will call you to review the results.   Testing/Procedures: Your provider has recommended that you have a cardiac CT scan. Please see below for further instructions.    Follow-Up: At [facility], you and your health needs are our priority.  As part of our continuing mission to provide you with exceptional heart care, we have created designated Provider Care Teams.  These Care Teams include your primary Cardiologist (physician) and Advanced Practice Providers (APPs -  Physician Assistants and Nurse Practitioners) who all work together to provide you with the care you need, when you need it.  The Watchman Team will be in contact with you after you CT scan for follow up.    WATCHMAN CT INSTRUCTIONS: Your WATCHMAN CT will be scheduled at [facility] [place], [place] [address] [phone]  Please arrive at the [facility] main entrance (entrance A) of [facility] 30 minutes prior to test start time. Proceed to the [facility] Radiology Department (first floor) to check-in and test prep.  Please follow these instructions carefully:  On the Night Before the Test: Be sure to Drink plenty of water. Do not consume any caffeinated/decaffeinated beverages or chocolate 12 hours prior to your test. Do not take any antihistamines 12 hours prior to your test.  On the Day of the Test: Drink  plenty of water until 1 hour prior to the test. Do not eat any food 4 hours prior to the test. You may take your regular medications prior to the test.  Take metoprolol (Lopressor) two hours prior to tes FEMALES- please wear underwire-free bra if available      After the Test: Drink plenty of water. After receiving IV contrast, you may experience a mild flushed feeling. This is normal. On occasion, you may experience a mild rash up to 24 hours after the test. This is not dangerous. If this occurs, you can take Benadryl 25 mg and increase your fluid intake. If you experience trouble breathing, this can be serious. If it is severe call 911 IMMEDIATELY. If it is mild, please call our office. If you take any of these medications: Glipizide/Metformin, Avandament, Glucavance, please do not take 48 hours after completing test unless otherwise instructed.  Once we have confirmed authorization from your insurance company, you will be called to set up a date and time for your test.   For non-scheduling related questions/concerns about your CT scan, please contact the cardiac imaging nurses: Marchia Bond, Cardiac Imaging Nurse Navigator Gordy Clement, Cardiac Imaging Nurse [facility] Direct Office Dial: [phone]   For scheduling needs, including cancellations and rescheduling, please call [place], [phone].  Lenice Llamas, the Watchman Nurse Navigator, will call you after your CT once the [facility] Team has reviewed your imaging for an update on proceedings. [place]'s direct number is [phone] if you need assistance.,a

## 2021-01-26 NOTE — Progress Notes (Signed)
Watchman Consult Note Date:  01/26/2021   ID:  Catherine [name], DOB 12/30/1945, MRN [id number]  PCP:  Ginger Organ., MD  Cardiologist:  none Primary Electrophysiologist: none Referring Physician: self-referred   CC: to discuss Watchman implant    History of Present Illness: Catherine [name] is a 76 y.o. female self-referred for evaluation of atrial fibrillation and stroke prevention. She has paroxysmal atrial fibrillation.  The patient is interested in pursuing Watchman implantation to avoid long-term anticoagulation in the setting of advanced arthritis and need to take NSAID medications.  She therefore presents today for Watchman evaluation.   The patient is here with her husband today, Dr Victorino Dike. The patient has been followed in the atrial fibrillation clinic since June of this year.  She has had episodic palpitations for several years.  She captured atrial fibrillation on her apple watch and this was confirmed by review in the A. fib clinic.  She feels like she has had about 7 episodes of atrial fibrillation since her initial diagnosis.  She has had symptomatic palpitations that occur when she is in atrial fibrillation.  She also develops low blood pressure and is unable to take diltiazem because of systolic blood pressures in the range of 90 mmHg.  She stopped taking Celebrex because of Eliquis use.  She tried alternatives such as extra strength Tylenol but this did not help with her chronic pain problems.  She is currently taking Vicodin as needed for moderate to severe pain.  She really would like to get off of anticoagulation if at all possible so that her arthritis pain can be better managed.  She has responded very well to Celebrex and nonsteroidal anti-inflammatories in the past.   Today, she denies symptoms of palpitations, chest pain, shortness of breath, orthopnea, PND, lower extremity edema, claudication, dizziness, presyncope, syncope, bleeding, or neurologic sequela. The  patient is tolerating medications without difficulties and is otherwise without complaint today.    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 Sherren Mocha Early to co surgeon;  Surgeon: Kristeen Miss, MD;  Location: [place];  Service: Neurosurgery;  Laterality: N/A;   BACK SURGERY     Fusion   CATARACT EXTRACTION W/ INTRAOCULAR LENS  IMPLANT, BILATERAL     CERVICAL BIOPSY  W/ LOOP ELECTRODE EXCISION  2006   CERVICAL CONIZATION W/BX N/A 12/28/2014   Procedure: CONIZATION CERVIX WITH BIOPSY;  Surgeon: Terrance Mass, MD;  Location: [facility];  Service: Gynecology;  Laterality: N/A;  COLONOSCOPY     COLPOSCOPY     DILATION AND CURETTAGE OF UTERUS     HARDWARE REMOVAL N/A 06/17/2018   Procedure: Removal of bilateral iliac fixation;  Surgeon: Kristeen Miss, MD;  Location: [facility];  Service: Neurosurgery;  Laterality: N/A;  Removal of bilateral iliac fixation   HYSTEROSCOPY WITH D & C N/A 12/28/2014   Procedure: DILATATION AND CURETTAGE /HYSTEROSCOPY Diagnostic Hysteroscopy;  Surgeon: Terrance Mass, MD;  Location: [facility];  Service:  Gynecology;  Laterality: N/A;   JOINT REPLACEMENT     Left knee   KNEE SURGERY     Rt-95,Lft.-98,Replacement-09   LUMBAR LAMINECTOMY/DECOMPRESSION MICRODISCECTOMY Bilateral 08/10/2014   Procedure: Bilateral Lumbar five-Sacral one Diskectomy;  Surgeon: Kristeen Miss, MD;  Location: [place] [facility];  Service: Neurosurgery;  Laterality: Bilateral;  Bilateral L5-S1 Diskectomy   ROTATOR CUFF REPAIR  2004   TONSILLECTOMY     TOTAL KNEE ARTHROPLASTY Right 11/28/2020   Procedure: TOTAL KNEE ARTHROPLASTY;  Surgeon: Gaynelle Arabian, MD;  Location: [facility];  Service: Orthopedics;  Laterality: Right;   TUBAL LIGATION       Current Outpatient Medications  Medication Sig Dispense Refill   acetaminophen (TYLENOL) 500 MG tablet Take 1,000 mg by mouth every 6 (six) hours as needed for moderate pain or mild pain.     cholecalciferol (VITAMIN D) 25 MCG (1000 UNIT) tablet Take 1,000 Units by mouth daily.     diazepam (VALIUM) 5 MG tablet daily as needed. Taking 5 mg by mouth once daily     diltiazem (CARDIZEM) 30 MG tablet Take 1 tablet every 4 hours AS NEEDED for HR >100 as long as top BP >100. 30 tablet 1   ELIQUIS 5 MG TABS tablet Take 1 tablet (5 mg total) by mouth 2 (two) times daily. 60 tablet 3   HYDROcodone-acetaminophen (NORCO/VICODIN) 5-325 MG tablet Take 1-2 tablets by mouth every 6 (six) hours as needed for moderate pain or severe pain. 42 tablet 0   loratadine (CLARITIN) 10 MG tablet Take 10 mg by mouth daily as needed for allergies.     metoprolol tartrate (LOPRESSOR) 50 MG tablet Take 1 tablet (50 mg total) by mouth once for 1 dose. Take 1 tablet two hours prior to CT scan. 1 tablet 0   Polyethyl Glycol-Propyl Glycol (SYSTANE OP) Place 1 drop into both eyes 2 (two) times daily.     Probiotic Product (ALIGN) 4 MG CAPS Take 4 mg by mouth daily.     traMADol (ULTRAM) 50 MG tablet Take 1-2 tablets (50-100 mg total) by mouth every 6 (six) hours as needed for moderate pain. 40 tablet 0   vitamin B-12  (CYANOCOBALAMIN) 1000 MCG tablet Take 1,000 mcg by mouth daily.     zolpidem (AMBIEN) 10 MG tablet Take 10 mg by mouth at bedtime.   0   No current facility-administered medications for this visit.    Allergies:   Oxycodone   Social History:  The patient  reports that she has never smoked. She has never used smokeless tobacco. She reports current alcohol use of about 14.0 standard drinks per week. She reports that she does not use drugs.   Family History:  The patient's  family history includes Dementia in her mother; Diabetes in her father; Heart disease in her father; Heart failure in her father; Hypertension in her father and mother; Parkinson's disease in her brother.    ROS:  Please see the history of present illness.   All other systems are reviewed  and negative.    PHYSICAL EXAM: VS:  BP 112/80    Pulse 85    Ht 5' 1.5" (1.562 m)    Wt 106 lb 3.2 oz (48.2 kg)    SpO2 99%    BMI 19.74 kg/m  , BMI Body mass index is 19.74 kg/m. GEN: Well nourished, well developed, in no acute distress  HEENT: normal  Neck: no JVD, carotid bruits, or masses Cardiac: RRR; no murmurs, rubs, or gallops,no edema  Respiratory:  clear to auscultation bilaterally, normal work of breathing GI: soft, nontender, nondistended, + BS MS: no deformity or atrophy  Skin: warm and dry  Neuro:  Strength and sensation are intact Psych: euthymic mood, full affect  EKG:  EKG is not ordered today. The ekg from 12/30/2020 is reviewed and demonstrates normal sinus rhythm 74 bpm without significant abnormality.   Recent Labs: 11/18/2020: ALT 11 11/29/2020: BUN 10; Creatinine, Ser 0.48; Potassium 4.3; Sodium 138 01/25/2021: Hemoglobin 13.7; Platelets 291.0    Lipid Panel  No results found for: CHOL, TRIG, HDL, CHOLHDL, VLDL, LDLCALC, LDLDIRECT   Wt Readings from Last 3 Encounters:  01/26/21 106 lb 3.2 oz (48.2 kg)  01/25/21 105 lb (47.6 kg)  12/30/20 107 lb (48.5 kg)      Other studies Reviewed: Additional  studies/ records that were reviewed today include:  Echo:  1. Left ventricular ejection fraction, by estimation, is 60 to 65%. The  left ventricle has normal function. The left ventricle has no regional  wall motion abnormalities. Left ventricular diastolic parameters were  normal.   2. Right ventricular systolic function is normal. The right ventricular  size is normal. There is normal pulmonary artery systolic pressure. The  estimated right ventricular systolic pressure is 22.4 mmHg.   3. The mitral valve is normal in structure. Trivial mitral valve  regurgitation.   4. The aortic valve was not well visualized. Aortic valve regurgitation  is not visualized. No aortic stenosis is present.   5. The inferior vena cava is dilated in size with >50% respiratory  variability, suggesting right atrial pressure of 8 mmHg.     ASSESSMENT AND PLAN:  1.  Paroxysmal atrial fibrillation I have seen Catherine [name] is a 76 y.o. female in the office today who has been referred for a Watchman left atrial appendage closure device.  She has a history of paroxysmal atrial fibrillation.  This patients CHA2DS2-VASc Score and unadjusted Ischemic Stroke Rate (% per year) is equal to 3.2 % stroke rate/year from a score of 3 which necessitates long term oral anticoagulation to prevent stroke. HasBled score is:  HAS-BLED score  Hypertension No  Abnormal renal and liver function (Dialysis, transplant, Cr >2.26 mg/dL /Cirrhosis or Bilirubin >2x Normal or AST/ALT/AP >3x Normal) No  Stroke No  Bleeding No  Labile INR (Unstable/high INR) No  Elderly (>65) Yes  Drugs or alcohol (? 8 drinks/week, anti-plt or NSAID) No   Unfortunately, her quality of life is suffering because of inability to use nonsteroidal anti-inflammatory medications while on chronic oral anticoagulation.  She has a strong preference to discontinue anticoagulation and seeks out alternative treatment strategies for prevention of thromboembolism.   The patients chart has been reviewed and I feel the patient would be a candidate for short term oral anticoagulation.  Procedural risks for the Watchman implant have been reviewed with the patient including a 1% risk of stroke, 1% risk of perforation or pericardial effusion, 0.1% risk of device embolization.  Given the patient's poor  candidacy for long-term oral anticoagulation, ability to tolerate short term oral anticoagulation, I think it is reasonable to consider the Watchman FLX left atrial appendage closure device through a shared decision making conversation with the patient.  I demonstrated a procedural animation to the patient and her husband today, discussed procedural steps and potential complications, and reviewed our centers experience to date.  Prior to the procedure, I would like to obtain a gated CT scan of the chest with contrast timed for PV/LA visualization.  Depending on the patient's left atrial appendage anatomy, we will make further recommendations regarding her candidacy for watchman left atrial appendage occlusion.  If she has favorable anatomy, I think she would be a good candidate for this procedure.  However, if her anatomy is borderline or unfavorable, I would likely recommend against it.  Once the gated cardiac CTA is performed I will review to confirm the patient continues to be a suitable candidate for Watchman implant. If so, we will schedule for the implant procedure at the first available date.  She will also require shared decision making encounter with a not implanting physician since she is self-referred.  Current medicines are reviewed at length with the patient today.   The patient does not have concerns regarding her medicines.  The following changes were made today:  none  Labs/ tests ordered today include:  Orders Placed This Encounter  Procedures   CT CARDIAC MORPH/PULM VEIN W/CM&W/O CA SCORE   Basic metabolic panel     Signed, Sherren Mocha,  MD 01/26/2021  10:48 AM     [facility] [address] [place] [address] [phone] (office) [phone] (fax)

## 2021-01-26 NOTE — Telephone Encounter (Signed)
-----   Message from Irene Shipper, MD sent at 01/25/2021  1:40 PM EST ----- Regarding: RE: Is this patient deemed a difficult intubation? Thank you John.  Colleen and Paxton Binns, Please note that this case has been reviewed and she is NOT a difficult intubation.  She is cleared for her colonoscopy in the [place].  Please schedule her colonoscopy in the [place]. Thank you, Dr. Henrene Pastor  ----- Message ----- From: Osvaldo Angst, CRNA Sent: 01/25/2021   1:35 PM EST To: Irene Shipper, MD Subject: RE: Is this patient deemed a difficult intub#  Dr. Henrene Pastor,  Her last three intubation notes clearly demonstrate that she is not a difficult intubation. She is cleared for anesthetic care at [facility].  Thanks,  Osvaldo Angst ----- Message ----- From: Irene Shipper, MD Sent: 01/25/2021  12:55 PM EST To: Osvaldo Angst, CRNA Subject: Is this patient deemed a difficult intubatio#  Hi John, Happy new year! 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  There was a question of difficult intubation.  Could you review the chart to clarify this issue for me.  Thanks. [employer]

## 2021-01-27 DIAGNOSIS — M25561 Pain in right knee: Secondary | ICD-10-CM | POA: Diagnosis not present

## 2021-01-27 MED ORDER — SUPREP BOWEL PREP KIT 17.5-3.13-1.6 GM/177ML PO SOLN: 1.0000 | ORAL | 0 refills | Status: DC

## 2021-01-27 NOTE — Telephone Encounter (Signed)
Spoke with patient, colonoscopy scheduled for 1/23 at 8:30. Patient notified to hold Eliquis 2 days before the procedure. Patient notified prep sent to pharmacy and to let us know right away if it is not covered or affordable. Patient request colonoscopy instructions be sent via [place].

## 2021-01-27 NOTE — Telephone Encounter (Addendum)
Patient with diagnosis of atrail fibrillation on Eliquis 5 mg BID for anticoagulation for stroke prevention.  Procedure: Colonoscopy Date of procedure: TBD   CHA2DS2-VASc Score = 3   This indicates a 3.2% annual risk of stroke. The patient's score is based upon: CHF History: 0 HTN History: 0 Diabetes History: 0 Stroke History: 0 Vascular Disease History: 0 Age Score: 2 Gender Score: 1  CrCl 59 mL/min Platelet count 291  Per office protocol, patient can hold Eliquis for 2 days prior to procedure.

## 2021-01-27 NOTE — Telephone Encounter (Deleted)
See Clearance note for anticoagulation clearance

## 2021-01-27 NOTE — Telephone Encounter (Signed)
See other phone note. Patient has been scheduled and told to hold Eliquis 2 days.

## 2021-01-31 ENCOUNTER — Telehealth: Payer: Self-pay

## 2021-01-31 DIAGNOSIS — M25561 Pain in right knee: Secondary | ICD-10-CM | POA: Diagnosis not present

## 2021-01-31 NOTE — Telephone Encounter (Signed)
Offered to arrange cCT and visit with Dr. Gasper Sells for shared decision on 02/03/2021 for LAAO 02/09/2021. She states she has a colonoscopy 1/24 - she understands she cannot come off A/C for LAAO.   She opts to have [place] 03/16/2021. She will keep CT as scheduled 02/09/2021. Scheduled her for shared decision visit with Dr. Gasper Sells 02/15/2021. She was grateful for call and agrees with plan.

## 2021-02-02 DIAGNOSIS — M25561 Pain in right knee: Secondary | ICD-10-CM | POA: Diagnosis not present

## 2021-02-03 ENCOUNTER — Ambulatory Visit (HOSPITAL_COMMUNITY): Payer: PPO

## 2021-02-06 DIAGNOSIS — M25561 Pain in right knee: Secondary | ICD-10-CM | POA: Diagnosis not present

## 2021-02-06 NOTE — Progress Notes (Deleted)
Cardiology Office Note:    Date:  02/06/2021   ID:  Catherine [name], DOB Dec 12, 1945, MRN [id number]  PCP:  Ginger Organ., MD   [facility] Providers Cardiologist:  None { Click to update primary MD,subspecialty MD or APP then REFRESH:1}    Referring MD: Ginger Organ., MD   CC: *** Consulted for the evaluation of *** at the behest of Ginger Organ., MD  History of Present Illness:    Catherine [name] is a 76 y.o. female with a hx of PAF for discussion of left atrial appendage occlusion device.  Patient notes that (s)he is doing ***.   Since day prior/last visit notes *** . There are no*** interval hospital/ED visit.    No chest pain or pressure ***.  No SOB/DOE*** and no PND/Orthopnea***.  No weight gain or leg swelling***.  No palpitations or syncope ***.  Ambulatory blood pressure ***.   Past Medical History:  Diagnosis Date   Abnormal glandular Papanicolaou smear of cervix 11/04/2014   Arthritis    Atrial fibrillation ([place]) 06/2020   Back pain    CIN I (cervical intraepithelial neoplasia I)    LEEP 2006 margins free      negative HR HPV [id number]    Complication of anesthesia    Difficult intubation    needs pediatric equipment   Dysrhythmia    afib   Fever blister    Herniated nucleus pulposus, L5-S1 08/10/2014   Insomnia    Neck pain    Pseudoarthrosis of lumbar spine    Spasmodic dysphonia    Spondylolysis, lumbosacral 08/13/2014   Spondylitic Stenosis    Past Surgical History:  Procedure Laterality Date   ABDOMINAL EXPOSURE N/A 01/23/2017   Procedure: ABDOMINAL EXPOSURE;  Surgeon: Rosetta Posner, MD;  Location: [facility];  Service: Vascular;  Laterality: N/A;   ANTERIOR LUMBAR FUSION N/A 01/23/2017   Procedure: Revision of Lumbar five-Sacral One Fusion with Anterior lumbar interbody fusion, Dr. Sherren Mocha Early to co surgeon;  Surgeon: Kristeen Miss, MD;  Location: [place];  Service: Neurosurgery;  Laterality: N/A;   BACK SURGERY     Fusion   CATARACT  EXTRACTION W/ INTRAOCULAR LENS  IMPLANT, BILATERAL     CERVICAL BIOPSY  W/ LOOP ELECTRODE EXCISION  2006   CERVICAL CONIZATION W/BX N/A 12/28/2014   Procedure: CONIZATION CERVIX WITH BIOPSY;  Surgeon: Terrance Mass, MD;  Location: [place] [facility];  Service: Gynecology;  Laterality: N/A;   COLONOSCOPY     COLPOSCOPY     DILATION AND CURETTAGE OF UTERUS     HARDWARE REMOVAL N/A 06/17/2018   Procedure: Removal of bilateral iliac fixation;  Surgeon: Kristeen Miss, MD;  Location: [place];  Service: Neurosurgery;  Laterality: N/A;  Removal of bilateral iliac fixation   HYSTEROSCOPY WITH D & C N/A 12/28/2014   Procedure: DILATATION AND CURETTAGE /HYSTEROSCOPY Diagnostic Hysteroscopy;  Surgeon: Terrance Mass, MD;  Location: [facility];  Service: Gynecology;  Laterality: N/A;   JOINT REPLACEMENT     Left knee   KNEE SURGERY     Rt-95,Lft.-98,Replacement-09   LUMBAR LAMINECTOMY/DECOMPRESSION MICRODISCECTOMY Bilateral 08/10/2014   Procedure: Bilateral Lumbar five-Sacral one Diskectomy;  Surgeon: Kristeen Miss, MD;  Location: [place] [facility];  Service: Neurosurgery;  Laterality: Bilateral;  Bilateral L5-S1 Diskectomy   ROTATOR CUFF REPAIR  2004   TONSILLECTOMY     TOTAL KNEE ARTHROPLASTY Right 11/28/2020   Procedure: TOTAL KNEE ARTHROPLASTY;  Surgeon: Gaynelle Arabian, MD;  Location: [facility];  Service: Orthopedics;  Laterality: Right;   TUBAL LIGATION      Current Medications: No outpatient medications have been marked as taking for the 02/15/21 encounter (Appointment) with Werner Lean, MD.     Allergies:   Oxycodone   Social History   Socioeconomic History   Marital status: Married    Spouse name: Not on file   Number of children: Not on file   Years of education: Not on file   Highest education level: Not on file  Occupational History   Not on file  Tobacco Use   Smoking status: Never   Smokeless tobacco: Never  Vaping Use   Vaping Use: Never used  Substance and Sexual Activity   Alcohol  use: Yes    Alcohol/week: 14.0 standard drinks    Types: 14 Standard drinks or equivalent per week    Comment: daily drink   Drug use: No   Sexual activity: Yes    Birth control/protection: Post-menopausal, Surgical    Comment: INSURANCE QUESTIONS DECLINED  Other Topics Concern   Not on file  Social History Narrative   Not on file   Social Determinants of Health   Financial Resource Strain: Not on file  Food Insecurity: Not on file  Transportation Needs: Not on file  Physical Activity: Not on file  Stress: Not on file  Social Connections: Not on file     Family History: The patient's ***family history includes Dementia in her mother; Diabetes in her father; Heart disease in her father; Heart failure in her father; Hypertension in her father and mother; Parkinson's disease in her brother. There is no history of Breast cancer, Colon cancer, Esophageal cancer, or Stomach cancer.  ROS:   Please see the history of present illness.    *** All other systems reviewed and are negative.  EKGs/Labs/Other Studies Reviewed:    The following studies were reviewed today: ***  EKG:  EKG is *** ordered today.  The ekg ordered today demonstrates ***  Recent Labs: 11/18/2020: ALT 11 01/25/2021: Hemoglobin 13.7; Platelets 291.0 01/26/2021: BUN 21; Creatinine, Ser 0.63; Potassium 5.1; Sodium 139  Recent Lipid Panel No results found for: CHOL, TRIG, HDL, CHOLHDL, VLDL, LDLCALC, LDLDIRECT   Risk Assessment/Calculations:    CHA2DS2-VASc Score = 3  {Confirm score is correct.  If not, click here to update score.  REFRESH note.  :1} This indicates a 3.2% annual risk of stroke. The patient's score is based upon: CHF History: 0 HTN History: 0 Diabetes History: 0 Stroke History: 0 Vascular Disease History: 0 Age Score: 2 Gender Score: 1   {This patient has a significant risk of stroke if diagnosed with atrial fibrillation.  Please consider VKA or DOAC agent for anticoagulation if the  bleeding risk is acceptable.   You can also use the Smart[employer] .[place] for documentation.   :[id number]}       Physical Exam:    VS:  There were no vitals taken for this visit.    Wt Readings from Last 3 Encounters:  01/26/21 48.2 kg  01/25/21 47.6 kg  12/30/20 48.5 kg     Gen: *** distress, *** obese/well nourished/malnourished   Neck: No JVD, *** carotid bruit Ears: Pilar Plate Sign Cardiac: No Rubs or Gallops, *** Murmur, ***cardia, *** radial pulses Respiratory: Clear to auscultation bilaterally, *** effort, ***  respiratory rate GI: Soft, nontender, non-distended *** MS: No *** edema; *** moves all extremities Integument: Skin feels *** Neuro:  At time of evaluation, alert  and oriented to person/place/time/situation *** Psych: Normal affect, patient feels ***   ASSESSMENT:    No diagnosis found. PLAN:     Atrial fibrilation, CHADVASC 3, complicated by ***  [facility] Referral for Left Atrial Appendage Closure with Non-Valvular Atrial Fibrillation   Catherine [name] is a 76 y.o. female is being referred to the [facility] Team for evaluation for Left Atrial Appendage Closure with Watchman device for the management of stroke risk resulting form non-valvular atrial fibrillation.    Base upon Ms. Brott's history, she is felt to be a poor candidate for long-term anticoagulation because of {Select reason(s) patient is a poor candidate for long term [phone]}.  The patient has a HAS-BLED score of   indicating a Yearly Major Bleeding Risk of  %.  {Click here to update HAS-BLED Score:1}    Her CHADS2-VASc Score is 3 with an unadjusted Ischemic Stroke Rate (% per year) of 3.2%.  { Click here to update ZTIWP8-KDXI Score:1}  Her stroke risk necessitates a strategy of stroke prevention with either long-term oral anticoagulation or left atrial appendage occlusion therapy. We have discussed their bleeding risk in the context  of their comorbid medical problems, as well as the rationale for referral for evaluation of Watchman left atrial appendage occlusion therapy. While the patient is at high long-term bleeding risk, they may be appropriate for short-term anticoagulation. Based on this individual patient's stroke and bleeding risk, a shared decision has been made to refer the patient for consideration of Watchman left atrial appendage closure utilizing the [employer] of Cardiology shared decision tool.       {Are you ordering a CV Procedure (e.g. stress test, cath, DCCV, TEE, etc)?   Press F2        :[id number]}    Medication Adjustments/Labs and Tests Ordered: Current medicines are reviewed at length with the patient today.  Concerns regarding medicines are outlined above.  No orders of the defined types were placed in this encounter.  No orders of the defined types were placed in this encounter.   There are no Patient Instructions on file for this visit.   Signed, Werner Lean, MD  02/06/2021 4:20 PM    [facility]

## 2021-02-08 ENCOUNTER — Ambulatory Visit (HOSPITAL_COMMUNITY): Payer: PPO

## 2021-02-08 ENCOUNTER — Telehealth (HOSPITAL_COMMUNITY): Payer: Self-pay | Admitting: *Deleted

## 2021-02-08 DIAGNOSIS — M25561 Pain in right knee: Secondary | ICD-10-CM | POA: Diagnosis not present

## 2021-02-08 NOTE — Telephone Encounter (Signed)
Reaching out to patient to offer assistance regarding upcoming cardiac imaging study; pt verbalizes understanding of appt date/time, parking situation and where to check in, pre-test NPO status and medications ordered, and verified current allergies; name and call back number provided for further questions should they arise  Gordy Clement RN Navigator Cardiac Imaging [facility] [phone] office [phone] cell  Patient to take 50mg  metoprolol two hours prior to cardiac CT scan. She is aware to arrive at 8am for her 8:30am test.

## 2021-02-09 ENCOUNTER — Ambulatory Visit (HOSPITAL_COMMUNITY)
Admission: RE | Admit: 2021-02-09 | Discharge: 2021-02-09 | Disposition: A | Discharge status: Home / Self Care | Payer: PPO | Source: Ambulatory Visit | Attending: Cardiovascular Disease | Admitting: Cardiovascular Disease

## 2021-02-09 ENCOUNTER — Ambulatory Visit (HOSPITAL_COMMUNITY): Payer: PPO

## 2021-02-09 ENCOUNTER — Other Ambulatory Visit: Payer: Self-pay

## 2021-02-09 DIAGNOSIS — I480 Paroxysmal atrial fibrillation: Secondary | ICD-10-CM | POA: Insufficient documentation

## 2021-02-09 MED ORDER — IOHEXOL 350 MG/ML SOLN
95.0000 mL | Freq: Once | INTRAVENOUS | Status: AC | PRN
  Administered 2021-02-09: 95 mL via INTRAVENOUS

## 2021-02-13 ENCOUNTER — Encounter: Payer: Self-pay | Admitting: Internal Medicine

## 2021-02-13 ENCOUNTER — Other Ambulatory Visit: Payer: Self-pay

## 2021-02-13 ENCOUNTER — Ambulatory Visit (AMBULATORY_SURGERY_CENTER): Payer: PPO | Admitting: Internal Medicine

## 2021-02-13 VITALS — BP 109/73 | HR 78 | Temp 97.7°F | Resp 14 | Ht 61.0 in | Wt 105.0 lb

## 2021-02-13 DIAGNOSIS — I4891 Unspecified atrial fibrillation: Secondary | ICD-10-CM | POA: Diagnosis not present

## 2021-02-13 DIAGNOSIS — Z1211 Encounter for screening for malignant neoplasm of colon: Secondary | ICD-10-CM | POA: Diagnosis not present

## 2021-02-13 MED ORDER — SODIUM CHLORIDE 0.9 % IV SOLN: 500.0000 mL | Freq: Once | INTRAVENOUS | Status: DC

## 2021-02-13 NOTE — Patient Instructions (Addendum)
Handout on Diverticulosis given.  Resume Eliquis today  YOU HAD AN ENDOSCOPIC PROCEDURE TODAY AT [facility]:   Refer to the procedure report that was given to you for any specific questions about what was found during the examination.  If the procedure report does not answer your questions, please call your gastroenterologist to clarify.  If you requested that your care partner not be given the details of your procedure findings, then the procedure report has been included in a sealed envelope for you to review at your convenience later.  YOU SHOULD EXPECT: Some feelings of bloating in the abdomen. Passage of more gas than usual.  Walking can help get rid of the air that was put into your GI tract during the procedure and reduce the bloating. If you had a lower endoscopy (such as a colonoscopy or flexible sigmoidoscopy) you may notice spotting of blood in your stool or on the toilet paper. If you underwent a bowel prep for your procedure, you may not have a normal bowel movement for a few days.  Please Note:  You might notice some irritation and congestion in your nose or some drainage.  This is from the oxygen used during your procedure.  There is no need for concern and it should clear up in a day or so.  SYMPTOMS TO REPORT IMMEDIATELY:  Following lower endoscopy (colonoscopy or flexible sigmoidoscopy):  Excessive amounts of blood in the stool  Significant tenderness or worsening of abdominal pains  Swelling of the abdomen that is new, acute  Fever of 100F or higher  For urgent or emergent issues, a gastroenterologist can be reached at any hour by calling [phone]. Do not use MyChart messaging for urgent concerns.    DIET:  We do recommend a small meal at first, but then you may proceed to your regular diet.  Drink plenty of fluids but you should avoid alcoholic beverages for 24 hours.  ACTIVITY:  You should plan to take it easy for the rest of today and you should  NOT DRIVE or use heavy machinery until tomorrow (because of the sedation medicines used during the test).    FOLLOW UP: Our staff will call the number listed on your records 48-72 hours following your procedure to check on you and address any questions or concerns that you may have regarding the information given to you following your procedure. If we do not reach you, we will leave a message.  We will attempt to reach you two times.  During this call, we will ask if you have developed any symptoms of COVID 19. If you develop any symptoms (ie: fever, flu-like symptoms, shortness of breath, cough etc.) before then, please call [phone].  If you test positive for Covid 19 in the 2 weeks post procedure, please call and report this information to [place].    If any biopsies were taken you will be contacted by phone or by letter within the next 1-3 weeks.  Please call us at [phone] if you have not heard about the biopsies in 3 weeks.    SIGNATURES/CONFIDENTIALITY: You and/or your care partner have signed paperwork which will be entered into your electronic medical record.  These signatures attest to the fact that that the information above on your After Visit Summary has been reviewed and is understood.  Full responsibility of the confidentiality of this discharge information lies with you and/or your care-partner.

## 2021-02-13 NOTE — Progress Notes (Signed)
Pt's states no medical or surgical changes since previsit or office visit.  VS CW  

## 2021-02-13 NOTE — Progress Notes (Signed)
PT taken to PACU. Monitors in place. VSS. Report given to RN. 

## 2021-02-13 NOTE — Progress Notes (Signed)
HISTORY OF PRESENT ILLNESS:  Catherine [name] is a 76 y.o. female with past medical history as listed below who presents today for follow-up screening colonoscopy.  Negative for neoplasia examinations 2001, 2011.  No active complaints  REVIEW OF SYSTEMS:  All non-GI ROS negative. Past Medical History:  Diagnosis Date   Abnormal glandular Papanicolaou smear of cervix 11/04/2014   Arthritis    Atrial fibrillation ([place]) 06/2020   Back pain    CIN I (cervical intraepithelial neoplasia I)    LEEP 2006 margins free      negative HR HPV 6195    Complication of anesthesia    Difficult intubation    needs pediatric equipment   Dysrhythmia    afib   Fever blister    Herniated nucleus pulposus, L5-S1 08/10/2014   Insomnia    Neck pain    Pseudoarthrosis of lumbar spine    Spasmodic dysphonia    Spondylolysis, lumbosacral 08/13/2014   Spondylitic Stenosis    Past Surgical History:  Procedure Laterality Date   ABDOMINAL EXPOSURE N/A 01/23/2017   Procedure: ABDOMINAL EXPOSURE;  Surgeon: Rosetta Posner, MD;  Location: [facility];  Service: Vascular;  Laterality: N/A;   ANTERIOR LUMBAR FUSION N/A 01/23/2017   Procedure: Revision of Lumbar five-Sacral One Fusion with Anterior lumbar interbody fusion, Dr. Sherren Mocha Early to co surgeon;  Surgeon: Kristeen Miss, MD;  Location: [place];  Service: Neurosurgery;  Laterality: N/A;   BACK SURGERY     Fusion   CATARACT EXTRACTION W/ INTRAOCULAR LENS  IMPLANT, BILATERAL     CERVICAL BIOPSY  W/ LOOP ELECTRODE EXCISION  2006   CERVICAL CONIZATION W/BX N/A 12/28/2014   Procedure: CONIZATION CERVIX WITH BIOPSY;  Surgeon: Terrance Mass, MD;  Location: [facility];  Service: Gynecology;  Laterality: N/A;   COLONOSCOPY     COLPOSCOPY     DILATION AND CURETTAGE OF UTERUS     HARDWARE REMOVAL N/A 06/17/2018   Procedure: Removal of bilateral iliac fixation;  Surgeon: Kristeen Miss, MD;  Location: [place];  Service: Neurosurgery;  Laterality: N/A;  Removal of bilateral iliac  fixation   HYSTEROSCOPY WITH D & C N/A 12/28/2014   Procedure: DILATATION AND CURETTAGE /HYSTEROSCOPY Diagnostic Hysteroscopy;  Surgeon: Terrance Mass, MD;  Location: [facility];  Service: Gynecology;  Laterality: N/A;   JOINT REPLACEMENT     Left knee   KNEE SURGERY     Rt-95,Lft.-98,Replacement-09   LUMBAR LAMINECTOMY/DECOMPRESSION MICRODISCECTOMY Bilateral 08/10/2014   Procedure: Bilateral Lumbar five-Sacral one Diskectomy;  Surgeon: Kristeen Miss, MD;  Location: [place] [facility];  Service: Neurosurgery;  Laterality: Bilateral;  Bilateral L5-S1 Diskectomy   ROTATOR CUFF REPAIR  2004   TONSILLECTOMY     TOTAL KNEE ARTHROPLASTY Right 11/28/2020   Procedure: TOTAL KNEE ARTHROPLASTY;  Surgeon: Gaynelle Arabian, MD;  Location: [facility];  Service: Orthopedics;  Laterality: Right;   TUBAL LIGATION      Social History Catherine [name]  reports that she has never smoked. She has never used smokeless tobacco. She reports current alcohol use of about 14.0 standard drinks per week. She reports that she does not use drugs.  family history includes Dementia in her mother; Diabetes in her father; Heart disease in her father; Heart failure in her father; Hypertension in her father and mother; Parkinson's disease in her brother.  Allergies  Allergen Reactions   Oxycodone Other (See Comments)    Hallucinations       PHYSICAL EXAMINATION:  Vital signs: BP 121/70  Pulse 89    Temp 97.7 F (36.5 C) (Temporal)    Ht 5\' 1"  (1.549 m)    Wt 105 lb (47.6 kg)    SpO2 97%    BMI 19.84 kg/m  General: Well-developed, well-nourished, no acute distress HEENT: Sclerae are anicteric, conjunctiva pink. Oral mucosa intact Lungs: Clear Heart: Regular Abdomen: soft, nontender, nondistended, no obvious ascites, no peritoneal signs, normal bowel sounds. No organomegaly. Extremities: No edema Psychiatric: alert and oriented x3. Cooperative     ASSESSMENT:  1.  Colon cancer screening   PLAN:   1.  Screening  colonoscopy off Eliquis.

## 2021-02-13 NOTE — Op Note (Addendum)
Catherine [name]: Catherine [name] Procedure Date: 02/13/2021 8:54 AM MRN: [id number] Endoscopist: Docia Chuck. Catherine [name] , MD Age: 76 Referring MD:  Date of Birth: 1945-01-29 Gender: Female Account #: [id number] Procedure:                Colonoscopy Indications:              Screening for colorectal malignant neoplasm.                            Previous examinations (Dr. Olevia Perches) 2001, 2011 were                            both negative for neoplasia Medicines:                Monitored Anesthesia Care Procedure:                Pre-Anesthesia Assessment:                           - Prior to the procedure, a History and Physical                            was performed, and patient medications and                            allergies were reviewed. The patient's tolerance of                            previous anesthesia was also reviewed. The risks                            and benefits of the procedure and the sedation                            options and risks were discussed with the patient.                            All questions were answered, and informed consent                            was obtained. Prior Anticoagulants: The patient has                            taken Eliquis (apixaban), last dose was 2 days                            prior to procedure. ASA Grade Assessment: III - A                            patient with severe systemic disease. After                            reviewing the risks and benefits, the patient was  deemed in satisfactory condition to undergo the                            procedure.                           After obtaining informed consent, the colonoscope                            was passed under direct vision. Throughout the                            procedure, the patient's blood pressure, pulse, and                            oxygen saturations were monitored continuously. The                             Olympus CF-HQ190L ([id number]) Colonoscope was                            introduced through the anus and advanced to the the                            cecum, identified by appendiceal orifice and                            ileocecal valve. The ileocecal valve, appendiceal                            orifice, and rectum were photographed. The quality                            of the bowel preparation was excellent. The                            colonoscopy was performed without difficulty. The                            patient tolerated the procedure well. The bowel                            preparation used was SUPREP via split dose                            instruction. Scope In: 9:01:51 AM Scope Out: 9:11:46 AM Scope Withdrawal Time: 0 hours 6 minutes 54 seconds  Total Procedure Duration: 0 hours 9 minutes 55 seconds  Findings:                 Multiple diverticula were found in the left colon.                           The exam was otherwise without abnormality on  direct and retroflexion views. Complications:            No immediate complications. Estimated blood loss:                            None. Estimated Blood Loss:     Estimated blood loss: none. Impression:               - Diverticulosis in the left colon.                           - The examination was otherwise normal on direct                            and retroflexion views.                           - No specimens collected. Recommendation:           - Repeat colonoscopy is not recommended for                            screening purposes.                           - Patient has a contact number available for                            emergencies. The signs and symptoms of potential                            delayed complications were discussed with the                            patient. Return to normal activities tomorrow.                            Written discharge instructions  were provided to the                            patient.                           - Resume previous diet.                           - Continue present medications.                           -Resume Eliquis today Catherine [name] N. Catherine Pastor, MD 02/13/2021 9:21:58 AM This report has been signed electronically.

## 2021-02-15 ENCOUNTER — Ambulatory Visit: Payer: PPO | Admitting: Internal Medicine

## 2021-02-15 ENCOUNTER — Telehealth: Payer: Self-pay

## 2021-02-15 NOTE — Progress Notes (Signed)
Left atrial appendage anatomy is borderline for watchman implantation.  I think device failure rate would be significantly higher than normal because of lack of depth and necessary device size for a complete seal.  Discussed with the patient and her husband over the telephone in detail.  We will hold off on watchman implantation.  She could be considered for amulet implantation down the road is the experience grows with that device.

## 2021-02-15 NOTE — Telephone Encounter (Signed)
°  Follow up Call-  Call back number 02/13/2021  Post procedure Call Back phone  # [phone]  Permission to leave phone message Yes  Some recent data might be hidden     Patient questions:  Do you have a fever, pain , or abdominal swelling? No. Pain Score  0 *  Have you tolerated food without any problems? Yes.    Have you been able to return to your normal activities? Yes.    Do you have any questions about your discharge instructions: Diet   No. Medications  No. Follow up visit  No.  Do you have questions or concerns about your Care? No.  Actions: * If pain score is 4 or above: No action needed, pain <4.  Have you developed a fever since your procedure? No   2.   Have you had an respiratory symptoms (SOB or cough) since your procedure? no  3.   Have you tested positive for COVID 19 since your procedure no  4.   Have you had any family members/close contacts diagnosed with the COVID 19 since your procedure?  no   If yes to any of these questions please route to Joylene John, RN and Joella Prince, RN

## 2021-02-16 DIAGNOSIS — C44519 Basal cell carcinoma of skin of other part of trunk: Secondary | ICD-10-CM | POA: Diagnosis not present

## 2021-02-16 DIAGNOSIS — Z85828 Personal history of other malignant neoplasm of skin: Secondary | ICD-10-CM | POA: Diagnosis not present

## 2021-02-17 DIAGNOSIS — J385 Laryngeal spasm: Secondary | ICD-10-CM | POA: Diagnosis not present

## 2021-03-06 ENCOUNTER — Encounter (HOSPITAL_COMMUNITY): Payer: Self-pay | Admitting: Physician Assistant

## 2021-03-06 ENCOUNTER — Ambulatory Visit (HOSPITAL_COMMUNITY)
Admission: RE | Admit: 2021-03-06 | Discharge: 2021-03-06 | Disposition: A | Discharge status: Home / Self Care | Payer: PPO | Source: Ambulatory Visit | Attending: Physician Assistant | Admitting: Physician Assistant

## 2021-03-06 ENCOUNTER — Other Ambulatory Visit: Payer: Self-pay

## 2021-03-06 VITALS — BP 114/60 | HR 71 | Ht 61.0 in | Wt 105.6 lb

## 2021-03-06 DIAGNOSIS — I480 Paroxysmal atrial fibrillation: Secondary | ICD-10-CM | POA: Diagnosis not present

## 2021-03-06 DIAGNOSIS — D6869 Other thrombophilia: Secondary | ICD-10-CM | POA: Diagnosis not present

## 2021-03-06 DIAGNOSIS — I4821 Permanent atrial fibrillation: Secondary | ICD-10-CM | POA: Insufficient documentation

## 2021-03-06 DIAGNOSIS — Z7901 Long term (current) use of anticoagulants: Secondary | ICD-10-CM | POA: Diagnosis not present

## 2021-03-06 NOTE — Progress Notes (Signed)
Primary Care Physician: Ginger Organ., MD Primary Cardiologist: none Primary Electrophysiologist: none Referring Physician: Dr [name] is a 76 y.o. female with a history of atrial fibrillation who presents for follow up in the [facility].  The patient was initially diagnosed with atrial fibrillation 06/21/20 on her Apple Watch (strips personally reviewed). Patient has a CHADS2VASC score of 3. She has had intermittent palpitaitons and dizziness infrequently for years. She denies significant snoring. She has noted dark chocolate appears to be a trigger for her. She was seen by Dr Burt Knack on 01/26/21 for Watchman evaluation. Unfortunately, her LAA anatomy was not favorable for the device.   On follow up today, patient has been having more frequent episodes of afib, longest lasting 6 hours. She has also been more symptomatic with dizziness and low BP during the episodes. No bleeding issues on anticoagulation.   Today, she denies symptoms of chest pain, shortness of breath, orthopnea, PND, lower extremity edema, presyncope, syncope, snoring, daytime somnolence, bleeding, or neurologic sequela. The patient is tolerating medications without difficulties and is otherwise without complaint today.    Atrial Fibrillation Risk Factors:  she does not have symptoms or diagnosis of sleep apnea. she does not have a history of rheumatic fever. she does have a history of alcohol use. The patient does have a history of early familial atrial fibrillation or other arrhythmias. Brother has afib.  she has a BMI of Body mass index is 19.95 kg/m.Marland Kitchen Filed Weights   03/06/21 1537  Weight: 47.9 kg      Family History  Problem Relation Age of Onset   Hypertension Mother    Dementia Mother    Hypertension Father    Diabetes Father    Heart disease Father    Heart failure Father    Parkinson's disease Brother    Breast cancer Neg Hx    Colon cancer Neg Hx     Esophageal cancer Neg Hx    Stomach cancer Neg Hx      Atrial Fibrillation Management history:  Previous antiarrhythmic drugs: none Previous cardioversions: none Previous ablations: none CHADS2VASC score: 3 Anticoagulation history: Eliquis    Past Medical History:  Diagnosis Date   Abnormal glandular Papanicolaou smear of cervix 11/04/2014   Arthritis    Atrial fibrillation ([place]) 06/2020   Back pain    CIN I (cervical intraepithelial neoplasia I)    LEEP 2006 margins free      negative HR HPV [id number]    Complication of anesthesia    Difficult intubation    needs pediatric equipment   Dysrhythmia    afib   Fever blister    Herniated nucleus pulposus, L5-S1 08/10/2014   Insomnia    Neck pain    Pseudoarthrosis of lumbar spine    Spasmodic dysphonia    Spondylolysis, 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 Sherren Mocha Early to co surgeon;  Surgeon: Kristeen Miss, MD;  Location: [facility];  Service: Neurosurgery;  Laterality: N/A;   BACK SURGERY     Fusion   CATARACT EXTRACTION W/ INTRAOCULAR LENS  IMPLANT, BILATERAL     CERVICAL  BIOPSY  W/ LOOP ELECTRODE EXCISION  2006   CERVICAL CONIZATION W/BX N/A 12/28/2014   Procedure: CONIZATION CERVIX WITH BIOPSY;  Surgeon: Terrance Mass, MD;  Location: [facility];  Service: Gynecology;  Laterality: N/A;   COLONOSCOPY     COLPOSCOPY     DILATION AND CURETTAGE OF UTERUS     HARDWARE REMOVAL N/A 06/17/2018   Procedure: Removal of bilateral iliac fixation;  Surgeon: Kristeen Miss, MD;  Location: [facility];  Service: Neurosurgery;  Laterality: N/A;  Removal of bilateral iliac fixation   HYSTEROSCOPY WITH D & C  N/A 12/28/2014   Procedure: DILATATION AND CURETTAGE /HYSTEROSCOPY Diagnostic Hysteroscopy;  Surgeon: Terrance Mass, MD;  Location: [facility];  Service: Gynecology;  Laterality: N/A;   JOINT REPLACEMENT     Left knee   KNEE SURGERY     Rt-95,Lft.-98,Replacement-09   LUMBAR LAMINECTOMY/DECOMPRESSION MICRODISCECTOMY Bilateral 08/10/2014   Procedure: Bilateral Lumbar five-Sacral one Diskectomy;  Surgeon: Kristeen Miss, MD;  Location: [facility];  Service: Neurosurgery;  Laterality: Bilateral;  Bilateral L5-S1 Diskectomy   ROTATOR CUFF REPAIR  2004   TONSILLECTOMY     TOTAL KNEE ARTHROPLASTY Right 11/28/2020   Procedure: TOTAL KNEE ARTHROPLASTY;  Surgeon: Gaynelle Arabian, MD;  Location: [facility];  Service: Orthopedics;  Laterality: Right;   TUBAL LIGATION      Current Outpatient Medications  Medication Sig Dispense Refill   acetaminophen (TYLENOL) 500 MG tablet Take 1,000 mg by mouth every 6 (six) hours as needed for moderate pain or mild pain.     cholecalciferol (VITAMIN D) 25 MCG (1000 UNIT) tablet Take 1,000 Units by mouth daily.     diazepam (VALIUM) 5 MG tablet daily as needed. Taking 5 mg by mouth once daily     diltiazem (CARDIZEM) 30 MG tablet Take 1 tablet every 4 hours AS NEEDED for HR >100 as long as top BP >100. 30 tablet 1   ELIQUIS 5 MG TABS tablet Take 1 tablet (5 mg total) by mouth 2 (two) times daily. 60 tablet 3   HYDROcodone-acetaminophen (NORCO/VICODIN) 5-325 MG tablet Take 1-2 tablets by mouth every 6 (six) hours as needed for moderate pain or severe pain. 42 tablet 0   loratadine (CLARITIN) 10 MG tablet Take 10 mg by mouth daily as needed for allergies.     Polyethyl Glycol-Propyl Glycol (SYSTANE OP) Place 1 drop into both eyes 2 (two) times daily.     Probiotic Product (ALIGN) 4 MG CAPS Take 4 mg by mouth daily.     traMADol (ULTRAM) 50 MG tablet Take 1-2 tablets (50-100 mg total) by mouth every 6 (six) hours as needed for moderate pain. 40 tablet 0   vitamin B-12  (CYANOCOBALAMIN) 1000 MCG tablet Take 1,000 mcg by mouth daily.     zolpidem (AMBIEN) 10 MG tablet Take 10 mg by mouth at bedtime.   0   metoprolol tartrate (LOPRESSOR) 50 MG tablet Take 1 tablet (50 mg total) by mouth once for 1 dose. Take 1 tablet two hours prior to CT scan. 1 tablet 0   No current facility-administered medications for this encounter.    Allergies  Allergen Reactions   Oxycodone Other (See Comments)    Hallucinations    Social History   Socioeconomic History   Marital status: Married    Spouse name: Not on file   Number of children: Not on file   Years of education: Not on file   Highest education level: Not on file  Occupational History  Not on file  Tobacco Use   Smoking status: Never   Smokeless tobacco: Never  Vaping Use   Vaping Use: Never used  Substance and Sexual Activity   Alcohol use: Yes    Alcohol/week: 7.0 - 14.0 standard drinks    Types: 7 - 14 Standard drinks or equivalent per week    Comment: 1-2 drinks daily 03/06/21   Drug use: No   Sexual activity: Yes    Birth control/protection: Post-menopausal, Surgical    Comment: INSURANCE QUESTIONS DECLINED  Other Topics Concern   Not on file  Social History Narrative   Not on file   Social Determinants of Health   Financial Resource Strain: Not on file  Food Insecurity: Not on file  Transportation Needs: Not on file  Physical Activity: Not on file  Stress: Not on file  Social Connections: Not on file  Intimate Partner Violence: Not on file     ROS- All systems are reviewed and negative except as per the HPI above.  Physical Exam: Vitals:   03/06/21 1537  BP: 114/60  Pulse: 71  Weight: 47.9 kg  Height: 5\' 1"  (1.549 m)    GEN- The patient is a well appearing elderly female, alert and oriented x 3 today.   HEENT-head normocephalic, atraumatic, sclera clear, conjunctiva pink, hearing intact, trachea midline. Lungs- Clear to ausculation bilaterally, normal work of  breathing Heart- Regular rate and rhythm, no murmurs, rubs or gallops  GI- soft, NT, ND, + BS Extremities- no clubbing, cyanosis, or edema MS- no significant deformity or atrophy Skin- no rash or lesion Psych- euthymic mood, full affect Neuro- strength and sensation are intact   Wt Readings from Last 3 Encounters:  03/06/21 47.9 kg  02/13/21 47.6 kg  01/26/21 48.2 kg    EKG today demonstrates  SR Vent. rate 71 BPM PR interval 180 ms QRS duration 74 ms QT/QTcB 390/423 ms  Echo 07/28/20 demonstrated  1. Left ventricular ejection fraction, by estimation, is 60 to 65%. The  left ventricle has normal function. The left ventricle has no regional  wall motion abnormalities. Left ventricular diastolic parameters were  normal.   2. Right ventricular systolic function is normal. The right ventricular  size is normal. There is normal pulmonary artery systolic pressure. The estimated right ventricular systolic pressure is 77.1 mmHg.   3. The mitral valve is normal in structure. Trivial mitral valve  regurgitation.   4. The aortic valve was not well visualized. Aortic valve regurgitation  is not visualized. No aortic stenosis is present.   5. The inferior vena cava is dilated in size with >50% respiratory  variability, suggesting right atrial pressure of 8 mmHg.   Epic records are reviewed at length today  CHA2DS2-VASc Score = 3  The patient's score is based upon: CHF History: 0 HTN History: 0 Diabetes History: 0 Stroke History: 0 Vascular Disease History: 0 Age Score: 2 Gender Score: 1        ASSESSMENT AND PLAN: 1. Paroxysmal Atrial Fibrillation (ICD10:  I48.0) The patient's CHA2DS2-VASc score is 3, indicating a 3.2% annual risk of stroke.   Patient having more frequent episodes of afib. We discussed rhythm control options today including AAD vs ablation. She is agreeable to discussing ablation with EP, will refer. Offered short-term AAD as a bridge to ablation, patient  declined for now.  Continue Eliquis 5 mg BID Apple Watch for home monitoring. Continue diltiazem 30 mg PRN q 4 hours for hear racing.  2. Secondary Hypercoagulable  State (ICD10:  K3182819) The patient is at significant risk for stroke/thromboembolism based upon her CHA2DS2-VASc Score of 3.  Continue Apixaban (Eliquis).    Follow up with EP for ablation evaluation.    [facility] [address] [place], [place] [address] [phone] 03/06/2021 3:48 PM

## 2021-03-20 DIAGNOSIS — C44519 Basal cell carcinoma of skin of other part of trunk: Secondary | ICD-10-CM | POA: Diagnosis not present

## 2021-03-20 DIAGNOSIS — Z85828 Personal history of other malignant neoplasm of skin: Secondary | ICD-10-CM | POA: Diagnosis not present

## 2021-03-29 DIAGNOSIS — M4306 Spondylolysis, lumbar region: Secondary | ICD-10-CM | POA: Diagnosis not present

## 2021-03-31 ENCOUNTER — Encounter: Payer: Self-pay | Admitting: Cardiology

## 2021-03-31 ENCOUNTER — Ambulatory Visit: Payer: PPO | Admitting: Cardiology

## 2021-03-31 ENCOUNTER — Encounter: Payer: Self-pay | Admitting: *Deleted

## 2021-03-31 ENCOUNTER — Other Ambulatory Visit: Payer: Self-pay

## 2021-03-31 VITALS — BP 93/61 | HR 64 | Ht 61.0 in | Wt 105.5 lb

## 2021-03-31 DIAGNOSIS — I480 Paroxysmal atrial fibrillation: Secondary | ICD-10-CM

## 2021-03-31 DIAGNOSIS — Z01818 Encounter for other preprocedural examination: Secondary | ICD-10-CM

## 2021-03-31 DIAGNOSIS — Z01812 Encounter for preprocedural laboratory examination: Secondary | ICD-10-CM

## 2021-03-31 NOTE — Progress Notes (Signed)
Catherine [name]:    Date:  03/31/2021   ID:  Catherine [name], DOB 03-18-1945, MRN [id number]  PCP:  Ginger Organ., MD   [facility] Electrophysiologist:  Vickie Epley, MD   Referring MD: Oliver Barre, PA   Chief Complaint: Atrial fibrillation  History of Present Illness:    Catherine [name] is a 76 y.o. female who presents for an evaluation of atrial fibrillation at the request of Catherine Peals, PA-C. Their medical history includes spondylolysis, atrial fibrillation.  The patient was last seen by Ricky March 06, 2021.  She was first diagnosed with atrial fibrillation on Jun 21, 2020 by her Apple Watch.  She is identified dark chocolate as a possible trigger for her episodes.  She was actually seen by Dr. Burt Knack on January 26, 2021 for possible left atrial appendage occlusion but her left atrial appendage was not felt to be suitable for watchman implant.  At the appointment with Audry Pili she reported more frequent episodes of atrial fibrillation lasting up to 6 hours.  These episodes have been symptomatic with dizziness and lower blood pressures.  She is tolerating anticoagulation without bleeding issues.  At that visit, rhythm control options were discussed including antiarrhythmic drug therapy and catheter ablation.  Today she is doing well.  She is with her husband in clinic.  She is very active.  She exercises regularly.  She does have trouble with back pain has had multiple back procedures.  The pain is best controlled with NSAIDs but she is unable to use those because of the anticoagulation.  She tells me that when she is in atrial fibrillation she can immediately tell.  She experiences fatigue that can last for hours or even into the next day after a prolonged episode of atrial fibrillation.  She also experiences palpitations during the salvos of atrial fibrillation.  She tells me that when they first started back in May 2022 the episodes occurred erratically and  were once every 6 weeks or so.  Over time they become more frequent and occur nearly every week.  They are also getting longer.  She is currently taking Eliquis without any bleeding issues.     Past Medical History:  Diagnosis Date   Abnormal glandular Papanicolaou smear of cervix 11/04/2014   Arthritis    Atrial fibrillation ([place]) 06/2020   Back pain    CIN I (cervical intraepithelial neoplasia I)    LEEP 2006 margins free      negative HR HPV 5573    Complication of anesthesia    Difficult intubation    needs pediatric equipment   Dysrhythmia    afib   Fever blister    Herniated nucleus pulposus, L5-S1 08/10/2014   Insomnia    Neck pain    Pseudoarthrosis of lumbar spine    Spasmodic dysphonia    Spondylolysis, lumbosacral 08/13/2014   Spondylitic Stenosis    Past Surgical History:  Procedure Laterality Date   ABDOMINAL EXPOSURE N/A 01/23/2017   Procedure: ABDOMINAL EXPOSURE;  Surgeon: Rosetta Posner, MD;  [facility];  Service: Vascular;  Laterality: N/A;   ANTERIOR LUMBAR FUSION N/A 01/23/2017   Procedure: Revision of Lumbar five-Sacral One Fusion with Anterior lumbar interbody fusion, Dr. Sherren Mocha Early to co surgeon;  Surgeon: Kristeen Miss, MD;  Location: [facility];  Service: Neurosurgery;  Laterality: N/A;   BACK SURGERY     Fusion   CATARACT EXTRACTION W/ INTRAOCULAR LENS  IMPLANT, BILATERAL  CERVICAL BIOPSY  W/ LOOP ELECTRODE EXCISION  2006   CERVICAL CONIZATION W/BX N/A 12/28/2014   Procedure: CONIZATION CERVIX WITH BIOPSY;  Surgeon: Terrance Mass, MD;  Location: [facility];  Service: Gynecology;  Laterality: N/A;   COLONOSCOPY     COLPOSCOPY     DILATION AND CURETTAGE OF UTERUS     HARDWARE REMOVAL N/A 06/17/2018   Procedure: Removal of bilateral iliac fixation;  Surgeon: Kristeen Miss, MD;  Location: [facility];  Service: Neurosurgery;  Laterality: N/A;  Removal of bilateral iliac fixation   HYSTEROSCOPY WITH D & C N/A 12/28/2014   Procedure: DILATATION AND CURETTAGE  /HYSTEROSCOPY Diagnostic Hysteroscopy;  Surgeon: Terrance Mass, MD;  Location: [facility];  Service: Gynecology;  Laterality: N/A;   JOINT REPLACEMENT     Left knee   KNEE SURGERY     Rt-95,Lft.-98,Replacement-09   LUMBAR LAMINECTOMY/DECOMPRESSION MICRODISCECTOMY Bilateral 08/10/2014   Procedure: Bilateral Lumbar five-Sacral one Diskectomy;  Surgeon: Kristeen Miss, MD;  Location: [facility];  Service: Neurosurgery;  Laterality: Bilateral;  Bilateral L5-S1 Diskectomy   ROTATOR CUFF REPAIR  2004   TONSILLECTOMY     TOTAL KNEE ARTHROPLASTY Right 11/28/2020   Procedure: TOTAL KNEE ARTHROPLASTY;  Surgeon: Gaynelle Arabian, MD;  Location: [facility];  Service: Orthopedics;  Laterality: Right;   TUBAL LIGATION      Current Medications: No outpatient medications have been marked as taking for the 03/31/21 encounter (Office Visit) with Vickie Epley, MD.     Allergies:   Oxycodone   Social History   Socioeconomic History   Marital status: Married    Spouse name: Not on file   Number of children: Not on file   Years of education: Not on file   Highest education level: Not on file  Occupational History   Not on file  Tobacco Use   Smoking status: Never   Smokeless tobacco: Never  Vaping Use   Vaping Use: Never used  Substance and Sexual Activity   Alcohol use: Yes    Alcohol/week: 7.0 - 14.0 standard drinks    Types: 7 - 14 Standard drinks or equivalent per week    Comment: 1-2 drinks daily 03/06/21   Drug use: No   Sexual activity: Yes    Birth control/protection: Post-menopausal, Surgical    Comment: INSURANCE QUESTIONS DECLINED  Other Topics Concern   Not on file  Social History Narrative   Not on file   Social Determinants of Health   Financial Resource Strain: Not on file  Food Insecurity: Not on file  Transportation Needs: Not on file  Physical Activity: Not on file  Stress: Not on file  Social Connections: Not on file     Family History: The patient's family  history includes Dementia in her mother; Diabetes in her father; Heart disease in her father; Heart failure in her father; Hypertension in her father and mother; Parkinson's disease in her brother. There is no history of Breast cancer, Colon cancer, Esophageal cancer, or Stomach cancer.  ROS:   Please see the history of present illness.    All other systems reviewed and are negative.  EKGs/Labs/Other Studies Reviewed:    The following studies were reviewed today:  February 09, 2021 CT cardiac IMPRESSION: 1. The left atrial appendage is large chicken wing type with two lobes. 2. A 24 mm Watchman FLX device is recommended based on the above landing zone measurements (20.1 mm average diameter, oval landing zone; 16% compression). 3. There is no thrombus in  the left atrial appendage. 4. A mid/mid IAS puncture site is recommended. 5. Optimal deployment angle: RAO 22 CRA 13 6. Normal coronary origin. Right dominance.  CAC score of 0.  July 28, 2020 echo Left ventricular function normal, 60% Right ventricular function normal Trivial MR  March 06, 2021 EKG shows sinus rhythm.  Normal intervals.     Recent Labs: 11/18/2020: ALT 11 01/25/2021: Hemoglobin 13.7; Platelets 291.0 01/26/2021: BUN 21; Creatinine, Ser 0.63; Potassium 5.1; Sodium 139  Recent Lipid Panel No results found for: CHOL, TRIG, HDL, CHOLHDL, VLDL, LDLCALC, LDLDIRECT  Physical Exam:    VS:  BP 93/61    Pulse 64    Ht '5\' 1"'$  (1.549 m)    Wt 105 lb 8 oz (47.9 kg)    SpO2 94%    BMI 19.93 kg/m     Wt Readings from Last 3 Encounters:  03/31/21 105 lb 8 oz (47.9 kg)  03/06/21 105 lb 9.6 oz (47.9 kg)  02/13/21 105 lb (47.6 kg)     GEN:  Well nourished, well developed in no acute distress.  Appears younger than stated age. HEENT: Normal NECK: No JVD; No carotid bruits LYMPHATICS: No lymphadenopathy CARDIAC: RRR, no murmurs, rubs, gallops RESPIRATORY:  Clear to auscultation without rales, wheezing or rhonchi   ABDOMEN: Soft, non-tender, non-distended MUSCULOSKELETAL:  No edema; No deformity  SKIN: Warm and dry NEUROLOGIC:  Alert and oriented x 3 PSYCHIATRIC:  Normal affect       ASSESSMENT:    1. Paroxysmal atrial fibrillation (HCC)    PLAN:    In order of problems listed above:   #Paroxysmal atrial fibrillation Symptomatic.  Episodes are getting more frequent and lasting longer.  She is on Eliquis for stroke prophylaxis.  I had a long discussion with the patient and her husband during today's clinic visit regarding the treatment options for her atrial fibrillation.  We discussed the pathophysiology of atrial fibrillation and the natural progression between paroxysmal and persistent atrial fibrillation.  She is interested in avoiding long-term exposure to antiarrhythmic drug therapy which I think is very reasonable and would like to pursue catheter ablation.  I discussed the ablation procedure in detail including the risks, recovery and likelihood of success.  I discussed the potential need for repeat ablations or antiarrhythmic drug therapy.  Risk, benefits, and alternatives to EP study and radiofrequency ablation for afib were also discussed in detail today. These risks include but are not limited to stroke, bleeding, vascular damage, tamponade, perforation, damage to the esophagus, phrenic nerve, lungs, and other structures, pulmonary vein stenosis, worsening renal function, and death. The patient understands these risk and wishes to proceed.  We will therefore proceed with catheter ablation at the next available time.  Carto, ICE, anesthesia are requested for the procedure.    She has already had a CT scan of the chest and an echocardiogram.  We will proceed with scheduling the PVI.   Total time spent with patient today 60 minutes. This includes reviewing records, evaluating the patient and coordinating care.  Medication Adjustments/Labs and Tests Ordered: Current medicines are reviewed  at length with the patient today.  Concerns regarding medicines are outlined above.  No orders of the defined types were placed in this encounter.  No orders of the defined types were placed in this encounter.    Signed, Hilton Cork. Quentin Ore, MD, [facility], [facility] 03/31/2021 10:32 AM    [facility]

## 2021-03-31 NOTE — Patient Instructions (Addendum)
Medication Instructions:  ?Your physician recommends that you continue on your current medications as directed. Please refer to the Current Medication list given to you today. ? ?*If you need a refill on your cardiac medications before your next appointment, please call your pharmacy* ? ? ?Lab Work: ?See procedure instruction letter ? ?If you have labs (blood work) drawn today and your tests are completely normal, you will receive your results only by: ?MyChart Message (if you have MyChart) OR ?A paper copy in the mail ?If you have any lab test that is abnormal or we need to change your treatment, we will call you to review the results. ? ? ?Testing/Procedures: ?Your physician has recommended that you have an ablation. Catheter ablation is a medical procedure used to treat some cardiac arrhythmias (irregular heartbeats). During catheter ablation, a long, thin, flexible tube is put into a blood vessel in your groin (upper thigh), or neck. This tube is called an ablation catheter. It is then guided to your heart through the blood vessel. Radio frequency waves destroy small areas of heart tissue where abnormal heartbeats may cause an arrhythmia to start. Please follow instruction letter given to you today. ? ? ?Follow-Up: ?At [facility], you and your health needs are our priority.  As part of our continuing mission to provide you with exceptional heart care, we have created designated Provider Care Teams.  These Care Teams include your primary Cardiologist (physician) and Advanced Practice Providers (APPs -  Physician Assistants and Nurse Practitioners) who all work together to provide you with the care you need, when you need it. ? ?Your next appointment:   ?1 month(s) after your ablation ? ?The format for your next appointment:   ?In Person ? ?Provider:   ?AFib clinic ? ? ?Thank you for choosing [facility]!! ? ? ?[phone] ? ? ? ?Other Instructions ? ?Cardiac Ablation ?Cardiac ablation is a procedure to  destroy (ablate) some heart tissue that is sending bad signals. These bad signals cause problems in heart rhythm. ?The heart has many areas that make these signals. If there are problems in these areas, they can make the heart beat in a way that is not normal. Destroying some tissues can help make the heart rhythm normal. ?Tell your doctor about: ?Any allergies you have. ?All medicines you are taking. These include vitamins, herbs, eye drops, creams, and over-the-counter medicines. ?Any problems you or family members have had with medicines that make you fall asleep (anesthetics). ?Any blood disorders you have. ?Any surgeries you have had. ?Any medical conditions you have, such as kidney failure. ?Whether you are pregnant or may be pregnant. ?What are the risks? ?This is a safe procedure. But problems may occur, including: ?Infection. ?Bruising and bleeding. ?Bleeding into the chest. ?Stroke or blood clots. ?Damage to nearby areas of your body. ?Allergies to medicines or dyes. ?The need for a pacemaker if the normal system is damaged. ?Failure of the procedure to treat the problem. ?What happens before the procedure? ?Medicines ?Ask your doctor about: ?Changing or stopping your normal medicines. This is important. ?Taking aspirin and ibuprofen. Do not take these medicines unless your doctor tells you to take them. ?Taking other medicines, vitamins, herbs, and supplements. ?General instructions ?Follow instructions from your doctor about what you cannot eat or drink. ?Plan to have someone take you home from the hospital or clinic. ?If you will be going home right after the procedure, plan to have someone with you for 24 hours. ?Ask your doctor  what steps will be taken to prevent infection. ?What happens during the procedure? ? ?An IV tube will be put into one of your veins. ?You will be given a medicine to help you relax. ?The skin on your neck or groin will be numbed. ?A cut (incision) will be made in your neck or  groin. A needle will be put through your cut and into a large vein. ?A tube (catheter) will be put into the needle. The tube will be moved to your heart. ?Dye may be put through the tube. This helps your doctor see your heart. ?Small devices (electrodes) on the tube will send out signals. ?A type of energy will be used to destroy some heart tissue. ?The tube will be taken out. ?Pressure will be held on your cut. This helps stop bleeding. ?A bandage will be put over your cut. ?The exact procedure may vary among doctors and hospitals. ?What happens after the procedure? ?You will be watched until you leave the hospital or clinic. This includes checking your heart rate, breathing rate, oxygen, and blood pressure. ?Your cut will be watched for bleeding. You will need to lie still for a few hours. ?Do not drive for 24 hours or as long as your doctor tells you. ?Summary ?Cardiac ablation is a procedure to destroy some heart tissue. This is done to treat heart rhythm problems. ?Tell your doctor about any medical conditions you may have. Tell him or her about all medicines you are taking to treat them. ?This is a safe procedure. But problems may occur. These include infection, bruising, bleeding, and damage to nearby areas of your body. ?Follow what your doctor tells you about food and drink. You may also be told to change or stop some of your medicines. ?After the procedure, do not drive for 24 hours or as long as your doctor tells you. ?This information is not intended to replace advice given to you by your health care provider. Make sure you discuss any questions you have with your health care provider. ?Document Revised: 12/11/2018 Document Reviewed: 12/11/2018 ?Elsevier Patient Education ? 2022 [place]. ? ? ?

## 2021-04-04 ENCOUNTER — Telehealth: Payer: Self-pay | Admitting: *Deleted

## 2021-04-04 NOTE — Telephone Encounter (Addendum)
Clinical pharmacist to review Eliquis.  Patient has upcoming A-fib ablation scheduled on 05/18/2021.  Epidural injection is on 04/17/2021. ?

## 2021-04-04 NOTE — Telephone Encounter (Addendum)
Patient with diagnosis of afib on Eliquis for anticoagulation.   ? ?Procedure: T12-L1 TRANSLAMINAR EPIDURAL STEROID INJECTION ?Date of procedure: 04/17/21 ? ?CHA2DS2-VASc Score = 3  ? This indicates a 3.2% annual risk of stroke. ?The patient's score is based upon: ?CHF History: 0 ?HTN History: 0 ?Diabetes History: 0 ?Stroke History: 0 ?Vascular Disease History: 0 ?Age Score: 2 ?Gender Score: 1 ?  ?  ? ?CrCl 58 ml/min ? ?Per office protocol, patient can hold Eliquis for 3 days prior to procedure.   ? ?Ok to proceed with both procedures as patient will be back on Eliquis for at least 3 weeks prior to ablation.  ? ?

## 2021-04-04 NOTE — Telephone Encounter (Signed)
? ?  Pre-operative Risk Assessment  ?  ?Patient Name: Catherine [name]  ?DOB: 1945-03-24 ?MRN: [id number]  ? ?  ? ?Request for Surgical Clearance   ? ?Procedure:   T12-L1 TRANSLAMINAR EPIDURAL STEROID INJECTION ? ?Date of Surgery:  Clearance 04/17/21                              ?   ?Surgeon:  DR. Kristeen Miss ?Surgeon's Group or Practice Name:  [facility] ?Phone number:  [phone] ?Fax number:  [phone] ?  ?Type of Clearance Requested:   ?- Medical  ?- Pharmacy:  Hold Apixaban (Eliquis)   ?  ?Type of Anesthesia:   IV SEDATION ?  ?Additional requests/questions:   ? ?Signed, ?Julaine Hua   ?04/04/2021, 11:03 AM  ? ?

## 2021-04-05 NOTE — Telephone Encounter (Signed)
? ?  Name: Catherine [name]  ?DOB: March 22, 1945  ?MRN: [id number]  ? ?Primary Cardiologist: Dr. Quentin Ore ? ?Chart reviewed as part of pre-operative protocol coverage. Patient was contacted 04/05/2021 in reference to pre-operative risk assessment for pending surgery as outlined below.  Catherine [name] was last seen on 03/31/2021 by Dr. Quentin Ore.  Since that day, Catherine [name] has done . ? ?Therefore, based on ACC/AHA guidelines, the patient would be at acceptable risk for the planned procedure without further cardiovascular testing.  ? ?Patient has been instructed to hold Eliquis for 3 days prior to the procedure and restart as soon as possible afterward at the surgeon's discretion.  She is also scheduled for A-fib ablation 05/18/2021, by which time she should be back on Eliquis for minimum of 3 weeks.  Will forward to Dr. Quentin Ore to let him know as well. ? ?The patient was advised that if she develops new symptoms prior to surgery to contact our office to arrange for a follow-up visit, and she verbalized understanding. ? ?I will route this recommendation to the requesting party via Epic fax function and remove from pre-op pool. Please call with questions. ? ?Almyra Deforest, [place] ?04/05/2021, 9:53 AM ? ?

## 2021-04-13 ENCOUNTER — Other Ambulatory Visit (HOSPITAL_COMMUNITY): Payer: Self-pay | Admitting: Physician Assistant

## 2021-04-17 DIAGNOSIS — M5415 Radiculopathy, thoracolumbar region: Secondary | ICD-10-CM | POA: Diagnosis not present

## 2021-04-17 DIAGNOSIS — M5115 Intervertebral disc disorders with radiculopathy, thoracolumbar region: Secondary | ICD-10-CM | POA: Diagnosis not present

## 2021-04-28 DIAGNOSIS — J383 Other diseases of vocal cords: Secondary | ICD-10-CM | POA: Diagnosis not present

## 2021-05-09 ENCOUNTER — Institutional Professional Consult (permissible substitution): Payer: PPO | Admitting: Cardiology

## 2021-05-12 ENCOUNTER — Other Ambulatory Visit: Payer: PPO | Admitting: *Deleted

## 2021-05-12 DIAGNOSIS — Z01812 Encounter for preprocedural laboratory examination: Secondary | ICD-10-CM

## 2021-05-12 DIAGNOSIS — I480 Paroxysmal atrial fibrillation: Secondary | ICD-10-CM | POA: Diagnosis not present

## 2021-05-12 LAB — BASIC METABOLIC PANEL
BUN/Creatinine Ratio: 27 (ref 12–28)
BUN: 18 mg/dL (ref 8–27)
CO2: 25 mmol/L (ref 20–29)
Calcium: 9.6 mg/dL (ref 8.7–10.3)
Chloride: 99 mmol/L (ref 96–106)
Creatinine, Ser: 0.67 mg/dL (ref 0.57–1.00)
Glucose: 89 mg/dL (ref 70–99)
Potassium: 4.6 mmol/L (ref 3.5–5.2)
Sodium: 137 mmol/L (ref 134–144)
eGFR: 91 mL/min/{1.73_m2} (ref >59)

## 2021-05-12 LAB — CBC
Hematocrit: 40.3 % (ref 34.0–46.6)
Hemoglobin: 14.3 g/dL (ref 11.1–15.9)
MCH: 31.4 pg (ref 26.6–33.0)
MCHC: 35.5 g/dL (ref 31.5–35.7)
MCV: 89 fL (ref 79–97)
Platelets: 315 10*3/uL (ref 150–450)
RBC: 4.55 x10E6/uL (ref 3.77–5.28)
RDW: 12.8 % (ref 11.7–15.4)
WBC: 4.8 10*3/uL (ref 3.4–10.8)

## 2021-05-16 NOTE — Pre-Procedure Instructions (Signed)
Instructed patient on the following items: Arrival time 1130 Nothing to eat or drink after midnight No meds AM of procedure Responsible person to drive you home and stay with you for 24 hrs  Have you missed any doses of anti-coagulant Eliquis- hasn't missed any doses    

## 2021-05-17 ENCOUNTER — Telehealth: Payer: Self-pay

## 2021-05-17 NOTE — Telephone Encounter (Signed)
Per Tommye Standard, called and informed the patient and her husband of new arrival time for ablation tomorrow. ?They will arrive at 0930 for 1130 case. ?They were grateful for call and agree with plan.  ?

## 2021-05-18 ENCOUNTER — Ambulatory Visit (HOSPITAL_COMMUNITY): Payer: PPO | Admitting: Certified Registered"

## 2021-05-18 ENCOUNTER — Encounter (HOSPITAL_COMMUNITY)
Admission: RE | Disposition: A | Discharge status: Home / Self Care | Payer: Self-pay | Source: Ambulatory Visit | Attending: Cardiology

## 2021-05-18 ENCOUNTER — Ambulatory Visit (HOSPITAL_BASED_OUTPATIENT_CLINIC_OR_DEPARTMENT_OTHER): Payer: PPO | Admitting: Certified Registered"

## 2021-05-18 ENCOUNTER — Other Ambulatory Visit (HOSPITAL_COMMUNITY): Payer: Self-pay

## 2021-05-18 ENCOUNTER — Ambulatory Visit (HOSPITAL_COMMUNITY)
Admission: RE | Admit: 2021-05-18 | Discharge: 2021-05-18 | Disposition: A | Discharge status: Home / Self Care | Payer: PPO | Source: Ambulatory Visit | Attending: Cardiology | Admitting: Cardiology

## 2021-05-18 DIAGNOSIS — Z7901 Long term (current) use of anticoagulants: Secondary | ICD-10-CM | POA: Diagnosis not present

## 2021-05-18 DIAGNOSIS — I4891 Unspecified atrial fibrillation: Secondary | ICD-10-CM

## 2021-05-18 DIAGNOSIS — I480 Paroxysmal atrial fibrillation: Secondary | ICD-10-CM | POA: Insufficient documentation

## 2021-05-18 HISTORY — PX: ATRIAL FIBRILLATION ABLATION: EP1191

## 2021-05-18 LAB — POCT ACTIVATED CLOTTING TIME
Activated Clotting Time: 221 seconds
Activated Clotting Time: 275 seconds
Activated Clotting Time: 335 seconds

## 2021-05-18 SURGERY — ATRIAL FIBRILLATION ABLATION
Anesthesia: General

## 2021-05-18 MED ORDER — PANTOPRAZOLE SODIUM 40 MG PO TBEC
40.0000 mg | DELAYED_RELEASE_TABLET | Freq: Every day | ORAL | 0 refills | Status: DC
  Filled 2021-05-18: qty 45, 45d supply, fill #0

## 2021-05-18 MED ORDER — SODIUM CHLORIDE 0.9 % IV SOLN: 250.0000 mL | INTRAVENOUS | Status: DC | PRN

## 2021-05-18 MED ORDER — HEPARIN (PORCINE) IN NACL 1000-0.9 UT/500ML-% IV SOLN
INTRAVENOUS | Status: DC | PRN
  Administered 2021-05-18 (×4): 500 mL

## 2021-05-18 MED ORDER — ONDANSETRON HCL 4 MG/2ML IJ SOLN
INTRAMUSCULAR | Status: DC | PRN
  Administered 2021-05-18: 4 mg via INTRAVENOUS

## 2021-05-18 MED ORDER — FENTANYL CITRATE (PF) 250 MCG/5ML IJ SOLN
INTRAMUSCULAR | Status: DC | PRN
  Administered 2021-05-18: 100 ug via INTRAVENOUS

## 2021-05-18 MED ORDER — PROTAMINE SULFATE 10 MG/ML IV SOLN
INTRAVENOUS | Status: DC | PRN
Start: 2021-05-18 — End: 2021-05-18
  Administered 2021-05-18: 40 mg via INTRAVENOUS

## 2021-05-18 MED ORDER — SODIUM CHLORIDE 0.9% FLUSH: 3.0000 mL | Freq: Two times a day (BID) | INTRAVENOUS | Status: DC

## 2021-05-18 MED ORDER — HEPARIN SODIUM (PORCINE) 1000 UNIT/ML IJ SOLN
INTRAMUSCULAR | Status: AC
  Filled 2021-05-18: qty 10

## 2021-05-18 MED ORDER — ISOPROTERENOL HCL 0.2 MG/ML IJ SOLN
INTRAVENOUS | Status: DC | PRN
  Administered 2021-05-18: 2 ug/min via INTRAVENOUS

## 2021-05-18 MED ORDER — PROPOFOL 500 MG/50ML IV EMUL
INTRAVENOUS | Status: DC | PRN
  Administered 2021-05-18: 250 ug/kg/min via INTRAVENOUS

## 2021-05-18 MED ORDER — ONDANSETRON HCL 4 MG/2ML IJ SOLN: 4.0000 mg | Freq: Four times a day (QID) | INTRAMUSCULAR | Status: DC | PRN

## 2021-05-18 MED ORDER — HEPARIN (PORCINE) IN NACL 1000-0.9 UT/500ML-% IV SOLN
INTRAVENOUS | Status: AC
  Filled 2021-05-18: qty 500

## 2021-05-18 MED ORDER — HEPARIN SODIUM (PORCINE) 1000 UNIT/ML IJ SOLN
INTRAMUSCULAR | Status: DC | PRN
  Administered 2021-05-18: 1000 [IU] via INTRAVENOUS

## 2021-05-18 MED ORDER — MIDAZOLAM HCL 2 MG/2ML IJ SOLN
INTRAMUSCULAR | Status: DC | PRN
  Administered 2021-05-18: 2 mg via INTRAVENOUS

## 2021-05-18 MED ORDER — PHENYLEPHRINE 80 MCG/ML (10ML) SYRINGE FOR IV PUSH (FOR BLOOD PRESSURE SUPPORT)
PREFILLED_SYRINGE | INTRAVENOUS | Status: DC | PRN
  Administered 2021-05-18: 160 ug via INTRAVENOUS

## 2021-05-18 MED ORDER — SUGAMMADEX SODIUM 200 MG/2ML IV SOLN
INTRAVENOUS | Status: DC | PRN
  Administered 2021-05-18: 200 mg via INTRAVENOUS

## 2021-05-18 MED ORDER — ROCURONIUM BROMIDE 10 MG/ML (PF) SYRINGE
PREFILLED_SYRINGE | INTRAVENOUS | Status: DC | PRN
  Administered 2021-05-18: 40 mg via INTRAVENOUS
  Administered 2021-05-18: 20 mg via INTRAVENOUS
  Administered 2021-05-18: 40 mg via INTRAVENOUS

## 2021-05-18 MED ORDER — COLCHICINE 0.6 MG PO TABS
0.6000 mg | ORAL_TABLET | Freq: Two times a day (BID) | ORAL | 0 refills | Status: DC
  Filled 2021-05-18: qty 10, 5d supply, fill #0

## 2021-05-18 MED ORDER — PROPOFOL 10 MG/ML IV BOLUS
INTRAVENOUS | Status: DC | PRN
  Administered 2021-05-18: 50 mg via INTRAVENOUS
  Administered 2021-05-18: 30 mg via INTRAVENOUS
  Administered 2021-05-18 (×2): 20 mg via INTRAVENOUS
  Administered 2021-05-18: 80 mg via INTRAVENOUS

## 2021-05-18 MED ORDER — PHENYLEPHRINE HCL-NACL 20-0.9 MG/250ML-% IV SOLN
INTRAVENOUS | Status: DC | PRN
  Administered 2021-05-18: 50 ug/min via INTRAVENOUS

## 2021-05-18 MED ORDER — LACTATED RINGERS IV SOLN: INTRAVENOUS | Status: DC | PRN

## 2021-05-18 MED ORDER — SODIUM CHLORIDE 0.9% FLUSH: 3.0000 mL | INTRAVENOUS | Status: DC | PRN

## 2021-05-18 MED ORDER — PANTOPRAZOLE SODIUM 40 MG PO TBEC
40.0000 mg | DELAYED_RELEASE_TABLET | Freq: Every day | ORAL | Status: DC
  Administered 2021-05-18: 40 mg via ORAL
  Filled 2021-05-18: qty 1

## 2021-05-18 MED ORDER — HEPARIN SODIUM (PORCINE) 1000 UNIT/ML IJ SOLN
INTRAMUSCULAR | Status: DC | PRN
  Administered 2021-05-18: 5000 [IU] via INTRAVENOUS
  Administered 2021-05-18: 6000 [IU] via INTRAVENOUS
  Administered 2021-05-18: 4000 [IU] via INTRAVENOUS

## 2021-05-18 MED ORDER — EPHEDRINE SULFATE-NACL 50-0.9 MG/10ML-% IV SOSY
PREFILLED_SYRINGE | INTRAVENOUS | Status: DC | PRN
  Administered 2021-05-18: 10 mg via INTRAVENOUS

## 2021-05-18 MED ORDER — ISOPROTERENOL HCL 0.2 MG/ML IJ SOLN
INTRAMUSCULAR | Status: AC
  Filled 2021-05-18: qty 5

## 2021-05-18 MED ORDER — SODIUM CHLORIDE 0.9 % IV SOLN: INTRAVENOUS | Status: DC

## 2021-05-18 MED ORDER — LIDOCAINE 2% (20 MG/ML) 5 ML SYRINGE
INTRAMUSCULAR | Status: DC | PRN
  Administered 2021-05-18: 40 mg via INTRAVENOUS

## 2021-05-18 MED ORDER — ACETAMINOPHEN 325 MG PO TABS
650.0000 mg | ORAL_TABLET | ORAL | Status: DC | PRN
  Administered 2021-05-18: 650 mg via ORAL
  Filled 2021-05-18 (×3): qty 2

## 2021-05-18 MED ORDER — DEXAMETHASONE SODIUM PHOSPHATE 10 MG/ML IJ SOLN
INTRAMUSCULAR | Status: DC | PRN
  Administered 2021-05-18: 10 mg via INTRAVENOUS

## 2021-05-18 MED ORDER — COLCHICINE 0.6 MG PO TABS
0.6000 mg | ORAL_TABLET | Freq: Two times a day (BID) | ORAL | Status: DC
  Administered 2021-05-18: 0.6 mg via ORAL
  Filled 2021-05-18: qty 1

## 2021-05-18 SURGICAL SUPPLY — 18 items
CATH 8FR REPROCESSED SOUNDSTAR (CATHETERS) ×2 IMPLANT
CATH 8FR SOUNDSTAR REPROCESSED (CATHETERS) IMPLANT
CATH OCTARAY 1.5 F (CATHETERS) ×1 IMPLANT
CATH S CIRCA THERM PROBE 10F (CATHETERS) ×1 IMPLANT
CATH SMTCH THERMOCOOL SF DF (CATHETERS) ×1 IMPLANT
CATH WEB BI DIR CSDF CRV REPRO (CATHETERS) ×1 IMPLANT
CLOSURE PERCLOSE PROSTYLE (VASCULAR PRODUCTS) ×3 IMPLANT
COVER SWIFTLINK CONNECTOR (BAG) ×2 IMPLANT
PACK EP LATEX FREE (CUSTOM PROCEDURE TRAY) ×2
PACK EP LF (CUSTOM PROCEDURE TRAY) ×1 IMPLANT
PAD DEFIB RADIO PHYSIO CONN (PAD) ×2 IMPLANT
PATCH CARTO3 (PAD) ×1 IMPLANT
SHEATH BAYLIS TRANSSEPTAL 98CM (NEEDLE) ×1 IMPLANT
SHEATH CARTO VIZIGO SM CVD (SHEATH) ×1 IMPLANT
SHEATH PINNACLE 8F 10CM (SHEATH) ×2 IMPLANT
SHEATH PINNACLE 9F 10CM (SHEATH) ×1 IMPLANT
SHEATH PROBE COVER 6X72 (BAG) ×1 IMPLANT
TUBING SMART ABLATE COOLFLOW (TUBING) ×1 IMPLANT

## 2021-05-18 NOTE — Anesthesia Procedure Notes (Signed)
Procedure Name: Intubation ?Date/Time: 05/18/2021 11:12 AM ?Performed by: Lance Coon, CRNA ?Pre-anesthesia Checklist: Patient identified, Emergency Drugs available, Suction available, Timeout performed and Patient being monitored ?Patient Re-evaluated:Patient Re-evaluated prior to induction ?Oxygen Delivery Method: Circle system utilized ?Preoxygenation: Pre-oxygenation with 100% oxygen ?Induction Type: IV induction ?Ventilation: Mask ventilation without difficulty ?Laryngoscope Size: Glidescope and 3 ?Grade View: Grade I ?Tube type: Oral ?Tube size: 6.0 mm ?Number of attempts: 1 ?Airway Equipment and Method: Stylet ?Placement Confirmation: ETT inserted through vocal cords under direct vision, positive ETCO2 and breath sounds checked- equal and bilateral ?Secured at: 21 cm ?Tube secured with: Tape ?Dental Injury: Teeth and Oropharynx as per pre-operative assessment  ? ? ? ? ?

## 2021-05-18 NOTE — Discharge Instructions (Addendum)
Post procedure care instructions ?No driving for 4 days. No lifting over 5 lbs for 1 week. No vigorous or sexual activity for 1 week. You may return to work/your usual activities on 05/26/21. Keep procedure site clean & dry. If you notice increased pain, swelling, bleeding or pus, call/return!  You may shower after 24 hours, but no soaking in baths/hot tubs/pools for 1 week.  ? ? ?You have an appointment set up with the [facility].  Multiple studies have shown that being followed by a dedicated atrial fibrillation clinic in addition to the standard care you receive from your other physicians improves health. We believe that enrollment in the atrial fibrillation clinic will allow [place] to better care for you.  ? ?The phone number to the [facility] is [phone]. The clinic is staffed Monday through Friday from 8:30am to 5pm. ? ?Parking Directions: The clinic is located in the Heart and Vascular Building connected to [facility]. ?1)From [address] turn on to [address] and go to the 3rd entrance  (Heart and Vascular entrance) on the right. ?2)Look to the right for Heart &Vascular Parking Garage. ?3)A code for the entrance is required, for May is 1002.   ?4)Take the elevators to the 1st floor. Registration is in the room with the glass walls at the end of the hallway. ? ?If you have any trouble parking or locating the clinic, please don?t hesitate to call [phone].  ?Cardiac Ablation, Care After ? ?This sheet gives you information about how to care for yourself after your procedure. Your health care provider may also give you more specific instructions. If you have problems or questions, contact your health care provider. ?What can I expect after the procedure? ?After the procedure, it is common to have: ?Bruising around your puncture site. ?Tenderness around your puncture site. ?Skipped heartbeats. ?Tiredness (fatigue). ? ?Follow these instructions at home: ?Puncture  site care  ?Follow instructions from your health care provider about how to take care of your puncture site. Make sure you: ?If present, leave stitches (sutures), skin glue, or adhesive strips in place. These skin closures may need to stay in place for up to 2 weeks. If adhesive strip edges start to loosen and curl up, you may trim the loose edges. Do not remove adhesive strips completely unless your health care provider tells you to do that. ?If a large square bandage is present, this may be removed 24 hours after surgery.  ?Check your puncture site every day for signs of infection. Check for: ?Redness, swelling, or pain. ?Fluid or blood. If your puncture site starts to bleed, lie down on your back, apply firm pressure to the area, and contact your health care provider. ?Warmth. ?Pus or a bad smell. ?Driving ?Do not drive for at least 4 days after your procedure or however long your health care provider recommends. (Do not resume driving if you have previously been instructed not to drive for other health reasons.) ?Do not drive or use heavy machinery while taking prescription pain medicine. ?Activity ?Avoid activities that take a lot of effort for at least 7 days after your procedure. ?Do not lift anything that is heavier than 5 lb (4.5 kg) for one week.  ?No sexual activity for 1 week.  ?Return to your normal activities as told by your health care provider. Ask your health care provider what activities are safe for you. ?General instructions ?Take over-the-counter and prescription medicines only as told by your health care provider. ?  Do not use any products that contain nicotine or tobacco, such as cigarettes and e-cigarettes. If you need help quitting, ask your health care provider. ?You may shower after 24 hours, but Do not take baths, swim, or use a hot tub for 1 week.  ?Do not drink alcohol for 24 hours after your procedure. ?Keep all follow-up visits as told by your health care provider. This is  important. ?Contact a health care provider if: ?You have redness, mild swelling, or pain around your puncture site. ?You have fluid or blood coming from your puncture site that stops after applying firm pressure to the area. ?Your puncture site feels warm to the touch. ?You have pus or a bad smell coming from your puncture site. ?You have a fever. ?You have chest pain or discomfort that spreads to your neck, jaw, or arm. ?You are sweating a lot. ?You feel nauseous. ?You have a fast or irregular heartbeat. ?You have shortness of breath. ?You are dizzy or light-headed and feel the need to lie down. ?You have pain or numbness in the arm or leg closest to your puncture site. ?Get help right away if: ?Your puncture site suddenly swells. ?Your puncture site is bleeding and the bleeding does not stop after applying firm pressure to the area. ?These symptoms may represent a serious problem that is an emergency. Do not wait to see if the symptoms will go away. Get medical help right away. Call your local emergency services (911 in the [place] Do not drive yourself to the hospital. ?Summary ?After the procedure, it is normal to have bruising and tenderness at the puncture site in your groin, neck, or forearm. ?Check your puncture site every day for signs of infection. ?Get help right away if your puncture site is bleeding and the bleeding does not stop after applying firm pressure to the area. This is a medical emergency. ?This information is not intended to replace advice given to you by your health care provider. Make sure you discuss any questions you have with your health care provider. ?  ? ? ? ?

## 2021-05-18 NOTE — Transfer of Care (Signed)
Immediate Anesthesia Transfer of Care Note ? ?Patient: Catherine [name] ? ?Procedure(s) Performed: ATRIAL FIBRILLATION ABLATION ? ?Patient Location: Cath Lab ? ?Anesthesia Type:General ? ?Level of Consciousness: drowsy and patient cooperative ? ?Airway & Oxygen Therapy: Patient Spontanous Breathing and Patient connected to nasal cannula oxygen ? ?Post-op Assessment: Report given to RN and Post -op Vital signs reviewed and stable ? ?Post vital signs: Reviewed and stable ? ?Last Vitals:  ?Vitals Value Taken Time  ?BP    ?Temp    ?Pulse    ?Resp    ?SpO2    ? ? ?Last Pain:  ?Vitals:  ? 05/18/21 1025  ?TempSrc:   ?PainSc: 0-No pain  ?   ? ?  ? ?Complications: No notable events documented. ?

## 2021-05-18 NOTE — H&P (Signed)
?Electrophysiology Office Note:   ?  ?Date:  05/18/2021  ?  ?ID:  Catherine [name], DOB 1945/04/28, MRN [id number] ?  ?PCP:  Ginger Organ., MD      ?  ?[facility] Electrophysiologist:  Vickie Epley, MD  ?  ?Referring MD: Oliver Barre, PA  ?  ?Chief Complaint: Atrial fibrillation ?  ?History of Present Illness:   ?  ?Catherine [name] is a 76 y.o. female who presents for an evaluation of atrial fibrillation at the request of Adline Peals, PA-C. Their medical history includes spondylolysis, atrial fibrillation.  The patient was last seen by Ricky March 06, 2021.  She was first diagnosed with atrial fibrillation on Jun 21, 2020 by her Apple Watch.  She is identified dark chocolate as a possible trigger for her episodes.  She was actually seen by Dr. Burt Knack on January 26, 2021 for possible left atrial appendage occlusion but her left atrial appendage was not felt to be suitable for watchman implant.  At the appointment with Audry Pili she reported more frequent episodes of atrial fibrillation lasting up to 6 hours.  These episodes have been symptomatic with dizziness and lower blood pressures.  She is tolerating anticoagulation without bleeding issues.  At that visit, rhythm control options were discussed including antiarrhythmic drug therapy and catheter ablation. ?  ?Today she is doing well.  She is with her husband in clinic.  She is very active.  She exercises regularly.  She does have trouble with back pain has had multiple back procedures.  The pain is best controlled with NSAIDs but she is unable to use those because of the anticoagulation.  She tells me that when she is in atrial fibrillation she can immediately tell.  She experiences fatigue that can last for hours or even into the next day after a prolonged episode of atrial fibrillation.  She also experiences palpitations during the salvos of atrial fibrillation.  She tells me that when they first started back in May 2022 the episodes occurred  erratically and were once every 6 weeks or so.  Over time they become more frequent and occur nearly every week.  They are also getting longer.  She is currently taking Eliquis without any bleeding issues. ?  ? She is doing well today. Plan for PVI. Procedure reviewed with the patient and husband at the bedside. ? ? ?Objective  ?  ?    ?Past Medical History:  ?Diagnosis Date  ? Abnormal glandular Papanicolaou smear of cervix 11/04/2014  ? Arthritis    ? Atrial fibrillation ([place]) 06/2020  ? Back pain    ? CIN I (cervical intraepithelial neoplasia I)    ?  LEEP 2006 margins free      negative HR HPV 2008   ? Complication of anesthesia    ? Difficult intubation    ?  needs pediatric equipment  ? Dysrhythmia    ?  afib  ? Fever blister    ? Herniated nucleus pulposus, L5-S1 08/10/2014  ? Insomnia    ? Neck pain    ? Pseudoarthrosis of lumbar spine    ? Spasmodic dysphonia    ? 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  Spondylitic Stenosis  ?  ?  ?     ?Past Surgical History:  ?Procedure Laterality Date  ? ABDOMINAL EXPOSURE N/A 01/23/2017  ?  Procedure: ABDOMINAL EXPOSURE;  Surgeon: Rosetta Posner, MD;  Location: [place];  Service: Vascular;  Laterality: N/A;  ?  ANTERIOR LUMBAR FUSION N/A 01/23/2017  ?  Procedure: Revision of Lumbar five-Sacral One Fusion with Anterior lumbar interbody fusion, Dr. Sherren Mocha Early to co surgeon;  Surgeon: Kristeen Miss, MD;  Location: [place];  Service: Neurosurgery;  Laterality: N/A;  ? BACK SURGERY      ?  Fusion  ? CATARACT EXTRACTION W/ INTRAOCULAR LENS  IMPLANT, BILATERAL      ? CERVICAL BIOPSY  W/ LOOP ELECTRODE EXCISION   2006  ? CERVICAL CONIZATION W/BX N/A 12/28/2014  ?  Procedure: CONIZATION CERVIX WITH BIOPSY;  Surgeon: Terrance Mass, MD;  Location: [facility];  Service: Gynecology;  Laterality: N/A;  ? COLONOSCOPY      ? COLPOSCOPY      ? DILATION AND CURETTAGE OF UTERUS      ? HARDWARE REMOVAL N/A 06/17/2018  ?  Procedure: Removal of bilateral iliac fixation;  Surgeon: Kristeen Miss, MD;   Location: [place];  Service: Neurosurgery;  Laterality: N/A;  Removal of bilateral iliac fixation  ? HYSTEROSCOPY WITH D & C N/A 12/28/2014  ?  Procedure: DILATATION AND CURETTAGE /HYSTEROSCOPY Diagnostic Hysteroscopy;  Surgeon: Terrance Mass, MD;  Location: [facility];  Service: Gynecology;  Laterality: N/A;  ? JOINT REPLACEMENT      ?  Left knee  ? KNEE SURGERY      ?  Rt-95,Lft.-98,Replacement-09  ? LUMBAR LAMINECTOMY/DECOMPRESSION MICRODISCECTOMY Bilateral 08/10/2014  ?  Procedure: Bilateral Lumbar five-Sacral one Diskectomy;  Surgeon: Kristeen Miss, MD;  Location: [facility];  Service: Neurosurgery;  Laterality: Bilateral;  Bilateral L5-S1 Diskectomy  ? ROTATOR CUFF REPAIR   2004  ? TONSILLECTOMY      ? TOTAL KNEE ARTHROPLASTY Right 11/28/2020  ?  Procedure: TOTAL KNEE ARTHROPLASTY;  Surgeon: Gaynelle Arabian, MD;  Location: [facility];  Service: Orthopedics;  Laterality: Right;  ? TUBAL LIGATION      ?  ?  ?Current Medications: ?Active Medications  ?No outpatient medications have been marked as taking for the 03/31/21 encounter (Office Visit) with Vickie Epley, MD.  ?  ?  ?  ?Allergies:   Oxycodone  ?  ?Social History  ?  ?     ?Socioeconomic History  ? Marital status: Married  ?    Spouse name: Not on file  ? Number of children: Not on file  ? Years of education: Not on file  ? Highest education level: Not on file  ?Occupational History  ? Not on file  ?Tobacco Use  ? Smoking status: Never  ? Smokeless tobacco: Never  ?Vaping Use  ? Vaping Use: Never used  ?Substance and Sexual Activity  ? Alcohol use: Yes  ?    Alcohol/week: 7.0 - 14.0 standard drinks  ?    Types: 7 - 14 Standard drinks or equivalent per week  ?    Comment: 1-2 drinks daily 03/06/21  ? Drug use: No  ? Sexual activity: Yes  ?    Birth control/protection: Post-menopausal, Surgical  ?    Comment: INSURANCE QUESTIONS DECLINED  ?Other Topics Concern  ? Not on file  ?Social History Narrative  ? Not on file  ?  ?Social Determinants of Health  ?   ?Financial Resource Strain: Not on file  ?Food Insecurity: Not on file  ?Transportation Needs: Not on file  ?Physical Activity: Not on file  ?Stress: Not on file  ?Social Connections: Not on file  ?  ?  ?Family History: ?The patient's family history includes Dementia in her mother; Diabetes in  her father; Heart disease in her father; Heart failure in her father; Hypertension in her father and mother; Parkinson's disease in her brother. There is no history of Breast cancer, Colon cancer, Esophageal cancer, or Stomach cancer. ?  ?ROS:   ?Please see the history of present illness.    ?All other systems reviewed and are negative. ?  ?EKGs/Labs/Other Studies Reviewed:   ?  ?The following studies were reviewed today: ?  ?2021/02/27 CT cardiac ?IMPRESSION: ?1. The left atrial appendage is large chicken wing type with two ?lobes. ?2. A 24 mm Watchman FLX device is recommended based on the above ?landing zone measurements (20.1 mm average diameter, oval landing ?zone; 16% compression). ?3. There is no thrombus in the left atrial appendage. ?4. A mid/mid IAS puncture site is recommended. ?5. Optimal deployment angle: RAO 22 CRA 13 ?6. Normal coronary origin. Right dominance.  CAC score of 0. ?  ?July 28, 2020 echo ?Left ventricular function normal, 60% ?Right ventricular function normal ?Trivial MR ?  ?March 06, 2021 EKG shows sinus rhythm.  Normal intervals. ?  ?  ?  ?  ?Recent Labs: ?11/18/2020: ALT 11 ?01/25/2021: Hemoglobin 13.7; Platelets 291.0 ?01/26/2021: BUN 21; Creatinine, Ser 0.63; Potassium 5.1; Sodium 139  ?Recent Lipid Panel ?Labs (Brief)  ?No results found for: CHOL, TRIG, HDL, CHOLHDL, VLDL, LDLCALC, LDLDIRECT  ? ?  ?Physical Exam:   ?  ?VS:  BP 99/62   Pulse 76   Ht '5\' 1"'$  (1.549 m)   Wt 105 lb 8 oz (47.9 kg)   SpO2 94%   BMI 19.93 kg/m?    ?  ?   ?Wt Readings from Last 3 Encounters:  ?03/31/21 105 lb 8 oz (47.9 kg)  ?03/06/21 105 lb 9.6 oz (47.9 kg)  ?02/13/21 105 lb (47.6 kg)  ?  ?  ?GEN:  Well  nourished, well developed in no acute distress.  Appears younger than stated age. ?HEENT: Normal ?NECK: No JVD; No carotid bruits ?LYMPHATICS: No lymphadenopathy ?CARDIAC: RRR, no murmurs, rubs, gallops ?RESPIRATORY:  Clear

## 2021-05-18 NOTE — Progress Notes (Signed)
This RN spoke with Dr. Quentin Ore, prior to pt's transfer to Short Stay, he will clarify and let staff in short stay know when he wants pt to re-start eliquis, safety maintained ?

## 2021-05-18 NOTE — Anesthesia Preprocedure Evaluation (Signed)
Anesthesia Evaluation  ?Patient identified by MRN, date of birth, ID band ?Patient awake ? ? ? ?Reviewed: ?Allergy & Precautions, NPO status , Patient's Chart, lab work & pertinent test results ? ?History of Anesthesia Complications ?(+) DIFFICULT AIRWAY and history of anesthetic complications ? ?Airway ?Mallampati: III ? ?TM Distance: >3 FB ?Neck ROM: Full ? ?Mouth opening: Limited Mouth Opening ? Dental ? ?(+) Dental Advisory Given, Teeth Intact ?  ?Pulmonary ?neg pulmonary ROS,  ?  ?breath sounds clear to auscultation ? ? ? ? ? ? Cardiovascular ?(-) hypertension(-) angina(-) Past MI + dysrhythmias Atrial Fibrillation  ?Rhythm:Regular  ??1. Left ventricular ejection fraction, by estimation, is 60 to 65%. The  ?left ventricle has normal function. The left ventricle has no regional  ?wall motion abnormalities. Left ventricular diastolic parameters were  ?normal.  ??2. Right ventricular systolic function is normal. The right ventricular  ?size is normal. There is normal pulmonary artery systolic pressure. The  ?estimated right ventricular systolic pressure is 62.7 mmHg.  ??3. The mitral valve is normal in structure. Trivial mitral valve  ?regurgitation.  ??4. The aortic valve was not well visualized. Aortic valve regurgitation  ?is not visualized. No aortic stenosis is present.  ??5. The inferior vena cava is dilated in size with >50% respiratory  ?variability, suggesting right atrial pressure of 8 mmHg.  ?  ?Neuro/Psych ?neg Seizures  Neuromuscular disease   ? GI/Hepatic ?negative GI ROS, Neg liver ROS,   ?Endo/Other  ? ? Renal/GU ?Lab Results ?     Component                Value               Date                 ?     CREATININE               0.67                05/12/2021           ?  ? ?  ?Musculoskeletal ? ?(+) Arthritis ,  ? Abdominal ?  ?Peds ? Hematology ?negative hematology ROS ?(+) Lab Results ?     Component                Value               Date                 ?      WBC                      4.8                 05/12/2021           ?     HGB                      14.3                05/12/2021           ?     HCT                      40.3                05/12/2021           ?  MCV                      89                  05/12/2021           ?     PLT                      315                 05/12/2021           ?eliquis   ?Anesthesia Other Findings ? ? Reproductive/Obstetrics ? ?  ? ? ? ? ? ? ? ? ? ? ? ? ? ?  ?  ? ? ? ? ? ? ? ? ?Anesthesia Physical ?Anesthesia Plan ? ?ASA: 2 ? ?Anesthesia Plan: General  ? ?Post-op Pain Management: Minimal or no pain anticipated  ? ?Induction: Intravenous ? ?PONV Risk Score and Plan: 3 and Propofol infusion, TIVA and Ondansetron ? ?Airway Management Planned: Oral ETT and Video Laryngoscope Planned ? ?Additional Equipment: None ? ?Intra-op Plan:  ? ?Post-operative Plan: Extubation in OR ? ?Informed Consent: I have reviewed the patients History and Physical, chart, labs and discussed the procedure including the risks, benefits and alternatives for the proposed anesthesia with the patient or authorized representative who has indicated his/her understanding and acceptance.  ? ? ? ?Dental advisory given ? ?Plan Discussed with: CRNA ? ?Anesthesia Plan Comments:   ? ? ? ? ? ? ?Anesthesia Quick Evaluation ? ?

## 2021-05-19 ENCOUNTER — Encounter (HOSPITAL_COMMUNITY): Payer: Self-pay | Admitting: Cardiology

## 2021-05-19 NOTE — Anesthesia Postprocedure Evaluation (Signed)
Anesthesia Post Note ? ?Patient: Catherine [name] ? ?Procedure(s) Performed: ATRIAL FIBRILLATION ABLATION ? ?  ? ?Patient location during evaluation: Cath Lab ?Anesthesia Type: General ?Level of consciousness: patient cooperative and awake ?Pain management: pain level controlled ?Vital Signs Assessment: post-procedure vital signs reviewed and stable ?Respiratory status: spontaneous breathing, nonlabored ventilation, respiratory function stable and patient connected to nasal cannula oxygen ?Cardiovascular status: blood pressure returned to baseline and stable ?Postop Assessment: no apparent nausea or vomiting ?Anesthetic complications: no ? ? ?No notable events documented. ? ?Last Vitals:  ?Vitals:  ? 05/18/21 1600 05/18/21 1700  ?BP: 105/66 107/69  ?Pulse: 76 77  ?Resp: 20 16  ?Temp:    ?SpO2: 96% 95%  ?  ?Last Pain:  ?Vitals:  ? 05/18/21 1730  ?TempSrc:   ?PainSc: 8   ? ? ?  ?  ?  ?  ?  ?  ? ?[name] ? ? ? ? ?

## 2021-05-22 ENCOUNTER — Encounter: Payer: Self-pay | Admitting: Cardiology

## 2021-06-13 DIAGNOSIS — R7989 Other specified abnormal findings of blood chemistry: Secondary | ICD-10-CM | POA: Diagnosis not present

## 2021-06-13 DIAGNOSIS — R7301 Impaired fasting glucose: Secondary | ICD-10-CM | POA: Diagnosis not present

## 2021-06-13 DIAGNOSIS — Z79899 Other long term (current) drug therapy: Secondary | ICD-10-CM | POA: Diagnosis not present

## 2021-06-15 ENCOUNTER — Encounter (HOSPITAL_COMMUNITY): Payer: Self-pay

## 2021-06-15 ENCOUNTER — Ambulatory Visit (HOSPITAL_COMMUNITY)
Admission: RE | Admit: 2021-06-15 | Discharge: 2021-06-15 | Disposition: A | Discharge status: Home / Self Care | Payer: PPO | Source: Ambulatory Visit | Attending: Physician Assistant | Admitting: Physician Assistant

## 2021-06-15 VITALS — BP 102/68 | HR 78 | Ht 61.5 in | Wt 106.8 lb

## 2021-06-15 DIAGNOSIS — Z7901 Long term (current) use of anticoagulants: Secondary | ICD-10-CM | POA: Diagnosis not present

## 2021-06-15 DIAGNOSIS — I480 Paroxysmal atrial fibrillation: Secondary | ICD-10-CM | POA: Diagnosis not present

## 2021-06-15 DIAGNOSIS — D6869 Other thrombophilia: Secondary | ICD-10-CM | POA: Insufficient documentation

## 2021-06-15 NOTE — Progress Notes (Signed)
Primary Care Physician: Ginger Organ., MD Primary Cardiologist: none Primary Electrophysiologist: Dr Quentin Ore Referring Physician: Dr [name] is a 76 y.o. female with a history of atrial fibrillation who presents for follow up in the [facility].  The patient was initially diagnosed with atrial fibrillation 06/21/20 on her Apple Watch (strips personally reviewed). Patient has a CHADS2VASC score of 3. She has had intermittent palpitaitons and dizziness infrequently for years. She denies significant snoring. She has noted dark chocolate appears to be a trigger for her. She was seen by Dr Burt Knack on 01/26/21 for Watchman evaluation. Unfortunately, her LAA anatomy was not favorable for the device.   On follow up today, patient is s/p afib ablation with Dr Quentin Ore on 05/18/21. Patient reports that she has had no afib since the ablation. She denies CP, swallowing pain, or groin issues. No bleeding issues on anticoagulation.    Today, she denies symptoms of palpitations, chest pain, shortness of breath, orthopnea, PND, lower extremity edema, presyncope, syncope, snoring, daytime somnolence, bleeding, or neurologic sequela. The patient is tolerating medications without difficulties and is otherwise without complaint today.    Atrial Fibrillation Risk Factors:  she does not have symptoms or diagnosis of sleep apnea. she does not have a history of rheumatic fever. she does have a history of alcohol use. The patient does have a history of early familial atrial fibrillation or other arrhythmias. Brother has afib.  she has a BMI of Body mass index is 19.85 kg/m.Marland Kitchen Filed Weights   06/15/21 1022  Weight: 48.4 kg    Family History  Problem Relation Age of Onset   Hypertension Mother    Dementia Mother    Hypertension Father    Diabetes Father    Heart disease Father    Heart failure Father    Parkinson's disease Brother    Breast cancer Neg Hx     Colon cancer Neg Hx    Esophageal cancer Neg Hx    Stomach cancer Neg Hx      Atrial Fibrillation Management history:  Previous antiarrhythmic drugs: none Previous cardioversions: none Previous ablations: 05/18/21 CHADS2VASC score: 3 Anticoagulation history: Eliquis    Past Medical History:  Diagnosis Date   Abnormal glandular Papanicolaou smear of cervix 11/04/2014   Arthritis    Atrial fibrillation ([place]) 06/2020   Back pain    CIN I (cervical intraepithelial neoplasia I)    LEEP 2006 margins free      negative HR HPV 0630    Complication of anesthesia    Difficult intubation    needs pediatric equipment   Dysrhythmia    afib   Fever blister    Herniated nucleus pulposus, L5-S1 08/10/2014   Insomnia    Neck pain    Pseudoarthrosis of lumbar spine    Spasmodic dysphonia    Spondylolysis, 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 Sherren Mocha Early to co surgeon;  Surgeon: Kristeen Miss, MD;  Location: [place];  Service: Neurosurgery;  Laterality: N/A;   ATRIAL FIBRILLATION ABLATION N/A 05/18/2021   Procedure: ATRIAL FIBRILLATION ABLATION;  Surgeon: Vickie Epley, MD;  Location:  [facility];  Service: Cardiovascular;  Laterality: N/A;   BACK SURGERY     Fusion   CATARACT EXTRACTION W/ INTRAOCULAR LENS  IMPLANT, BILATERAL     CERVICAL BIOPSY  W/ LOOP ELECTRODE EXCISION  2006   CERVICAL CONIZATION W/BX N/A 12/28/2014   Procedure: CONIZATION CERVIX WITH BIOPSY;  Surgeon: Terrance Mass, MD;  Location: [facility];  Service: Gynecology;  Laterality: N/A;   COLONOSCOPY     COLPOSCOPY     DILATION AND CURETTAGE OF UTERUS     HARDWARE  REMOVAL N/A 06/17/2018   Procedure: Removal of bilateral iliac fixation;  Surgeon: Kristeen Miss, MD;  Location: [facility];  Service: Neurosurgery;  Laterality: N/A;  Removal of bilateral iliac fixation   HYSTEROSCOPY WITH D & C N/A 12/28/2014   Procedure: DILATATION AND CURETTAGE /HYSTEROSCOPY Diagnostic Hysteroscopy;  Surgeon: Terrance Mass, MD;  Location: [facility];  Service: Gynecology;  Laterality: N/A;   JOINT REPLACEMENT     Left knee   KNEE SURGERY     Rt-95,Lft.-98,Replacement-09   LUMBAR LAMINECTOMY/DECOMPRESSION MICRODISCECTOMY Bilateral 08/10/2014   Procedure: Bilateral Lumbar five-Sacral one Diskectomy;  Surgeon: Kristeen Miss, MD;  Location: [facility];  Service: Neurosurgery;  Laterality: Bilateral;  Bilateral L5-S1 Diskectomy   ROTATOR CUFF REPAIR  2004   TONSILLECTOMY     TOTAL KNEE ARTHROPLASTY Right 11/28/2020   Procedure: TOTAL KNEE ARTHROPLASTY;  Surgeon: Gaynelle Arabian, MD;  Location: [facility];  Service: Orthopedics;  Laterality: Right;   TUBAL LIGATION      Current Outpatient Medications  Medication Sig Dispense Refill   acetaminophen (TYLENOL) 500 MG tablet Take 1,000 mg by mouth every 6 (six) hours as needed for moderate pain or mild pain.     cholecalciferol (VITAMIN D) 25 MCG (1000 UNIT) tablet Take 1,000 Units by mouth daily.     diazepam (VALIUM) 2 MG tablet Take 2 mg by mouth 2 (two) times daily as needed for muscle spasms.     diltiazem (CARDIZEM) 30 MG tablet Take 1 tablet every 4 hours AS NEEDED for HR >100 as long as top BP >100. 30 tablet 1   ELIQUIS 5 MG TABS tablet TAKE 1 TABLET(5 MG) BY MOUTH TWICE DAILY (Patient taking differently: Take 5 mg by mouth 2 (two) times daily.) 60 tablet 3   HYDROcodone-acetaminophen (NORCO/VICODIN) 5-325 MG tablet Take 1-2 tablets by mouth every 6 (six) hours as needed for moderate pain or severe pain. 42 tablet 0   loratadine (CLARITIN) 10 MG tablet Take 10 mg by mouth daily as needed for allergies.     Probiotic Product (ALIGN) 4  MG CAPS Take 4 mg by mouth daily.     traMADol (ULTRAM) 50 MG tablet Take 1-2 tablets (50-100 mg total) by mouth every 6 (six) hours as needed for moderate pain. 40 tablet 0   vitamin B-12 (CYANOCOBALAMIN) 1000 MCG tablet Take 1,000 mcg by mouth daily.     zolpidem (AMBIEN) 10 MG tablet Take 10 mg by mouth at bedtime.   0   No current facility-administered medications for this encounter.    Allergies  Allergen Reactions   Oxycodone Other (See Comments)    Hallucinations   Colchicine Diarrhea and Nausea Only    Social History   Socioeconomic History   Marital status: Married    Spouse name: Not on file   Number of children: Not on file   Years of education: Not on file   Highest education level: Not on file  Occupational  History   Not on file  Tobacco Use   Smoking status: Never   Smokeless tobacco: Never  Vaping Use   Vaping Use: Never used  Substance and Sexual Activity   Alcohol use: Yes    Alcohol/week: 7.0 - 14.0 standard drinks    Types: 7 - 14 Standard drinks or equivalent per week    Comment: 1-2 drinks daily 03/06/21   Drug use: No   Sexual activity: Yes    Birth control/protection: Post-menopausal, Surgical    Comment: INSURANCE QUESTIONS DECLINED  Other Topics Concern   Not on file  Social History Narrative   Not on file   Social Determinants of Health   Financial Resource Strain: Not on file  Food Insecurity: Not on file  Transportation Needs: Not on file  Physical Activity: Not on file  Stress: Not on file  Social Connections: Not on file  Intimate Partner Violence: Not on file     ROS- All systems are reviewed and negative except as per the HPI above.  Physical Exam: Vitals:   06/15/21 1022  BP: 102/68  Pulse: 78  Weight: 48.4 kg  Height: 5' 1.5" (1.562 m)     GEN- The patient is a well appearing elderly female, alert and oriented x 3 today.   HEENT-head normocephalic, atraumatic, sclera clear, conjunctiva pink, hearing intact, trachea  midline. Lungs- Clear to ausculation bilaterally, normal work of breathing Heart- Regular rate and rhythm, no murmurs, rubs or gallops  GI- soft, NT, ND, + BS Extremities- no clubbing, cyanosis, or edema MS- no significant deformity or atrophy Skin- no rash or lesion Psych- euthymic mood, full affect Neuro- strength and sensation are intact   Wt Readings from Last 3 Encounters:  06/15/21 48.4 kg  05/18/21 46.9 kg  03/31/21 47.9 kg    EKG today demonstrates  SR Vent. rate 78 BPM PR interval 178 ms QRS duration 76 ms QT/QTcB 366/417 ms  Echo 07/28/20 demonstrated  1. Left ventricular ejection fraction, by estimation, is 60 to 65%. The  left ventricle has normal function. The left ventricle has no regional  wall motion abnormalities. Left ventricular diastolic parameters were  normal.   2. Right ventricular systolic function is normal. The right ventricular  size is normal. There is normal pulmonary artery systolic pressure. The estimated right ventricular systolic pressure is 51.8 mmHg.   3. The mitral valve is normal in structure. Trivial mitral valve  regurgitation.   4. The aortic valve was not well visualized. Aortic valve regurgitation  is not visualized. No aortic stenosis is present.   5. The inferior vena cava is dilated in size with >50% respiratory  variability, suggesting right atrial pressure of 8 mmHg.   Epic records are reviewed at length today  CHA2DS2-VASc Score = 3  The patient's score is based upon: CHF History: 0 HTN History: 0 Diabetes History: 0 Stroke History: 0 Vascular Disease History: 0 Age Score: 2 Gender Score: 1       ASSESSMENT AND PLAN: 1. Paroxysmal Atrial Fibrillation (ICD10:  I48.0) The patient's CHA2DS2-VASc score is 3, indicating a 3.2% annual risk of stroke.   S/p afib ablation on 05/18/21 Patient appears to be maintaining SR. Continue Eliquis 5 mg BID with no missed doses for 3 months post ablation.  Apple Watch for home  monitoring. Continue diltiazem 30 mg PRN q 4 hours for hear racing.  2. Secondary Hypercoagulable State (ICD10:  D68.69) The patient is at significant risk for stroke/thromboembolism based upon her CHA2DS2-VASc  Score of 3.  Continue Apixaban (Eliquis). She would like to get off anticoagulation in order to take NSAIDS for her orthopedic issues. She is not a candidate for Watchman. ? If she would be a good candidate for REACT AF trial when it is available.    Follow up with Dr Quentin Ore as scheduled.    [facility] [address] [place], [place] [address] [phone] 06/15/2021 11:12 AM

## 2021-06-20 DIAGNOSIS — Z1339 Encounter for screening examination for other mental health and behavioral disorders: Secondary | ICD-10-CM | POA: Diagnosis not present

## 2021-06-20 DIAGNOSIS — R82998 Other abnormal findings in urine: Secondary | ICD-10-CM | POA: Diagnosis not present

## 2021-06-20 DIAGNOSIS — Z96651 Presence of right artificial knee joint: Secondary | ICD-10-CM | POA: Diagnosis not present

## 2021-06-20 DIAGNOSIS — G5601 Carpal tunnel syndrome, right upper limb: Secondary | ICD-10-CM | POA: Diagnosis not present

## 2021-06-20 DIAGNOSIS — D6869 Other thrombophilia: Secondary | ICD-10-CM | POA: Diagnosis not present

## 2021-06-20 DIAGNOSIS — R7301 Impaired fasting glucose: Secondary | ICD-10-CM | POA: Diagnosis not present

## 2021-06-20 DIAGNOSIS — G4700 Insomnia, unspecified: Secondary | ICD-10-CM | POA: Diagnosis not present

## 2021-06-20 DIAGNOSIS — M159 Polyosteoarthritis, unspecified: Secondary | ICD-10-CM | POA: Diagnosis not present

## 2021-06-20 DIAGNOSIS — Z1331 Encounter for screening for depression: Secondary | ICD-10-CM | POA: Diagnosis not present

## 2021-06-20 DIAGNOSIS — M8580 Other specified disorders of bone density and structure, unspecified site: Secondary | ICD-10-CM | POA: Diagnosis not present

## 2021-06-20 DIAGNOSIS — M48061 Spinal stenosis, lumbar region without neurogenic claudication: Secondary | ICD-10-CM | POA: Diagnosis not present

## 2021-06-20 DIAGNOSIS — Z0000 Encounter for general adult medical examination without abnormal findings: Secondary | ICD-10-CM | POA: Diagnosis not present

## 2021-06-20 DIAGNOSIS — I480 Paroxysmal atrial fibrillation: Secondary | ICD-10-CM | POA: Diagnosis not present

## 2021-06-27 DIAGNOSIS — L820 Inflamed seborrheic keratosis: Secondary | ICD-10-CM | POA: Diagnosis not present

## 2021-06-27 DIAGNOSIS — Z85828 Personal history of other malignant neoplasm of skin: Secondary | ICD-10-CM | POA: Diagnosis not present

## 2021-06-27 DIAGNOSIS — L570 Actinic keratosis: Secondary | ICD-10-CM | POA: Diagnosis not present

## 2021-06-27 DIAGNOSIS — Z85820 Personal history of malignant melanoma of skin: Secondary | ICD-10-CM | POA: Diagnosis not present

## 2021-06-27 DIAGNOSIS — D2271 Melanocytic nevi of right lower limb, including hip: Secondary | ICD-10-CM | POA: Diagnosis not present

## 2021-06-27 DIAGNOSIS — L821 Other seborrheic keratosis: Secondary | ICD-10-CM | POA: Diagnosis not present

## 2021-07-06 ENCOUNTER — Other Ambulatory Visit: Payer: Self-pay | Admitting: Internal Medicine

## 2021-07-06 ENCOUNTER — Other Ambulatory Visit: Payer: Self-pay | Admitting: Nurse Practitioner

## 2021-07-06 DIAGNOSIS — Z1231 Encounter for screening mammogram for malignant neoplasm of breast: Secondary | ICD-10-CM

## 2021-07-21 DIAGNOSIS — J383 Other diseases of vocal cords: Secondary | ICD-10-CM | POA: Diagnosis not present

## 2021-07-26 ENCOUNTER — Ambulatory Visit
Admission: RE | Admit: 2021-07-26 | Discharge: 2021-07-26 | Disposition: A | Discharge status: Home / Self Care | Payer: PPO | Source: Ambulatory Visit | Attending: Internal Medicine | Admitting: Internal Medicine

## 2021-07-26 DIAGNOSIS — Z1231 Encounter for screening mammogram for malignant neoplasm of breast: Secondary | ICD-10-CM

## 2021-07-31 ENCOUNTER — Other Ambulatory Visit (HOSPITAL_COMMUNITY): Payer: Self-pay

## 2021-07-31 MED ORDER — ELIQUIS 5 MG PO TABS: 5.0000 mg | ORAL_TABLET | Freq: Two times a day (BID) | ORAL | 2 refills | Status: DC

## 2021-08-14 DIAGNOSIS — J069 Acute upper respiratory infection, unspecified: Secondary | ICD-10-CM | POA: Diagnosis not present

## 2021-08-14 DIAGNOSIS — J029 Acute pharyngitis, unspecified: Secondary | ICD-10-CM | POA: Diagnosis not present

## 2021-08-16 NOTE — Progress Notes (Signed)
Electrophysiology Office Follow up Visit Note:    Date:  08/17/2021   ID:  Catherine [name], DOB 02-28-1945, MRN [id number]  PCP:  Ginger Organ., MD  [facility] Cardiologist:  None  [facility] Electrophysiologist:  Vickie Epley, MD    Interval History:    Catherine [name] is a 76 y.o. female who presents for a follow up visit. They were last seen in clinic 03/31/2021.  Since their last appointment, they underwent successful atrial fibrillation ablation on 05/18/2021.  She followed up with Adline Peals, PA on 06/15/2021 where she denied any recurring episodes of atrial fibrillation. Her EKG showed SR at 78 bpm. CHA2DS2-VASc score is 3. She will continue Eliquis 5 mg BID with no missed doses for 3 months post ablation.  Today, she is feeling great and reports no recurrence of atrial fibrillation. Lately she hasn't needed to take her PRN diltiazem.  For her back pain and other arthralgias she is on hydrocodone.  She continues to tolerate her anticoagulation. No bleeding issues but she has noticed occasional easy bruising.  She confirms that she has been very active. Recently came back from a trip to the mountains and has been spending time with her 17 grandchildren.  They deny any palpitations, chest pain, shortness of breath, or peripheral edema. No lightheadedness, headaches, syncope, orthopnea, or PND.      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 Sherren Mocha Early to co surgeon;  Surgeon: Kristeen Miss, MD;  Location: [place];  Service: Neurosurgery;  Laterality: N/A;   ATRIAL FIBRILLATION ABLATION N/A 05/18/2021   Procedure: ATRIAL FIBRILLATION ABLATION;  Surgeon: Vickie Epley, MD;  Location: [place] [facility];  Service: Cardiovascular;  Laterality: N/A;   BACK SURGERY     Fusion   CATARACT EXTRACTION W/ INTRAOCULAR LENS  IMPLANT, BILATERAL     CERVICAL BIOPSY  W/ LOOP ELECTRODE EXCISION  2006   CERVICAL CONIZATION W/BX N/A 12/28/2014   Procedure: CONIZATION CERVIX WITH BIOPSY;  Surgeon: Terrance Mass, MD;  Location: [place] [facility];  Service: Gynecology;  Laterality: N/A;   COLONOSCOPY     COLPOSCOPY     DILATION AND CURETTAGE OF UTERUS     HARDWARE REMOVAL N/A 06/17/2018   Procedure: Removal of bilateral iliac fixation;  Surgeon: Kristeen Miss, MD;  Location: [place];  Service: Neurosurgery;  Laterality: N/A;  Removal of bilateral iliac fixation   HYSTEROSCOPY WITH D & C N/A 12/28/2014   Procedure: DILATATION AND CURETTAGE /  HYSTEROSCOPY Diagnostic Hysteroscopy;  Surgeon: Terrance Mass, MD;  Location: [place] [facility];  Service: Gynecology;  Laterality: N/A;   JOINT REPLACEMENT     Left knee   KNEE SURGERY     Rt-95,Lft.-98,Replacement-09   LUMBAR LAMINECTOMY/DECOMPRESSION MICRODISCECTOMY Bilateral 08/10/2014   Procedure: Bilateral Lumbar five-Sacral one Diskectomy;  Surgeon: Kristeen Miss, MD;  Location: [place] [facility];  Service: Neurosurgery;  Laterality: Bilateral;  Bilateral L5-S1 Diskectomy   ROTATOR CUFF REPAIR  2004   TONSILLECTOMY     TOTAL KNEE ARTHROPLASTY Right 11/28/2020    Procedure: TOTAL KNEE ARTHROPLASTY;  Surgeon: Gaynelle Arabian, MD;  Location: [facility];  Service: Orthopedics;  Laterality: Right;   TUBAL LIGATION      Current Medications: Current Meds  Medication Sig   acetaminophen (TYLENOL) 500 MG tablet Take 1,000 mg by mouth every 6 (six) hours as needed for moderate pain or mild pain.   cholecalciferol (VITAMIN D) 25 MCG (1000 UNIT) tablet Take 1,000 Units by mouth daily.   diazepam (VALIUM) 2 MG tablet Take 2 mg by mouth 2 (two) times daily as needed for muscle spasms.   diltiazem (CARDIZEM) 30 MG tablet Take 1 tablet every 4 hours AS NEEDED for HR >100 as long as top BP >100.   ELIQUIS 5 MG TABS tablet Take 1 tablet (5 mg total) by mouth 2 (two) times daily.   HYDROcodone-acetaminophen (NORCO/VICODIN) 5-325 MG tablet Take 1-2 tablets by mouth every 6 (six) hours as needed for moderate pain or severe pain.   loratadine (CLARITIN) 10 MG tablet Take 10 mg by mouth daily as needed for allergies.   Probiotic Product (ALIGN) 4 MG CAPS Take 4 mg by mouth daily.   traMADol (ULTRAM) 50 MG tablet Take 1-2 tablets (50-100 mg total) by mouth every 6 (six) hours as needed for moderate pain.   vitamin B-12 (CYANOCOBALAMIN) 1000 MCG tablet Take 1,000 mcg by mouth daily.   zolpidem (AMBIEN) 10 MG tablet Take 10 mg by mouth at bedtime.      Allergies:   Oxycodone and Colchicine   Social History   Socioeconomic History   Marital status: Married    Spouse name: Not on file   Number of children: Not on file   Years of education: Not on file   Highest education level: Not on file  Occupational History   Not on file  Tobacco Use   Smoking status: Never   Smokeless tobacco: Never  Vaping Use   Vaping Use: Never used  Substance and Sexual Activity   Alcohol use: Yes    Alcohol/week: 7.0 - 14.0 standard drinks of alcohol    Types: 7 - 14 Standard drinks or equivalent per week    Comment: 1-2 drinks daily 03/06/21   Drug use: No   Sexual activity: Yes     Birth control/protection: Post-menopausal, Surgical    Comment: INSURANCE QUESTIONS DECLINED  Other Topics Concern   Not on file  Social History Narrative   Not on file   Social Determinants of Health   Financial Resource Strain: Not on file  Food Insecurity: Not on file  Transportation Needs: Not on file  Physical Activity: Not on file  Stress: Not on file  Social Connections: Not on file     Family History: The patient's family history includes Dementia in her mother; Diabetes in her father; Heart disease in her father; Heart failure in her father; Hypertension in her father and mother; Parkinson's disease in her brother. There is no history of  Breast cancer, Colon cancer, Esophageal cancer, or Stomach cancer.  ROS:   Please see the history of present illness.    (+) Chronic back pain (+) Easy bruising All other systems reviewed and are negative.  EKGs/Labs/Other Studies Reviewed:    The following studies were reviewed today:  05/18/2021  Atrial Fibrillation Ablation: CONCLUSIONS: 1. Successful PVI 2. Successful ablation/isolation of the posterior wall 3. Intracardiac echo reveals small pericardial effusion, small LA, normal LA architecture, normal LV function. 4. No early apparent complications. 5. Resume Apixaban this evening. 6. Colchicine 0.'6mg'$  PO BID x 5 days 7. Protonix '40mg'$  PO daily x 45 days  02/09/2021  Cardiac CTA: IMPRESSION: 1. The left atrial appendage is large chicken wing type with two lobes.   2. A 24 mm Watchman FLX device is recommended based on the above landing zone measurements (20.1 mm average diameter, oval landing zone; 16% compression).   3. There is no thrombus in the left atrial appendage.   4. A mid/mid IAS puncture site is recommended.   5. Optimal deployment angle: RAO 22 CRA 13   6. Normal coronary origin. Right dominance.  CAC score of 0.    07/28/2020  Echocardiogram:  1. Left ventricular ejection fraction, by estimation, is 60  to 65%. The  left ventricle has normal function. The left ventricle has no regional  wall motion abnormalities. Left ventricular diastolic parameters were  normal.   2. Right ventricular systolic function is normal. The right ventricular  size is normal. There is normal pulmonary artery systolic pressure. The  estimated right ventricular systolic pressure is 01.0 mmHg.   3. The mitral valve is normal in structure. Trivial mitral valve  regurgitation.   4. The aortic valve was not well visualized. Aortic valve regurgitation  is not visualized. No aortic stenosis is present.   5. The inferior vena cava is dilated in size with >50% respiratory  variability, suggesting right atrial pressure of 8 mmHg.    EKG:  EKG is personally reviewed.  08/17/2021: Sinus tachycardia with a ventricular rate of 102 bpm.  Recent Labs: 11/18/2020: ALT 11 05/12/2021: BUN 18; Creatinine, Ser 0.67; Hemoglobin 14.3; Platelets 315; Potassium 4.6; Sodium 137   Recent Lipid Panel No results found for: "CHOL", "TRIG", "HDL", "CHOLHDL", "VLDL", "LDLCALC", "LDLDIRECT"  Physical Exam:    VS:  BP 108/70   Pulse (!) 102   Ht 5' 1.5" (1.562 m)   Wt 106 lb (48.1 kg)   BMI 19.70 kg/m     Wt Readings from Last 3 Encounters:  08/17/21 106 lb (48.1 kg)  06/15/21 106 lb 12.8 oz (48.4 kg)  05/18/21 103 lb 8 oz (46.9 kg)     GEN: Well nourished, well developed in no acute distress HEENT: Normal NECK: No JVD; No carotid bruits LYMPHATICS: No lymphadenopathy CARDIAC: RRR, no murmurs, rubs, gallops RESPIRATORY:  Clear to auscultation without rales, wheezing or rhonchi  ABDOMEN: Soft, non-tender, non-distended MUSCULOSKELETAL:  No edema; No deformity  SKIN: Warm and dry NEUROLOGIC:  Alert and oriented x 3 PSYCHIATRIC:  Normal affect        ASSESSMENT:    1. Paroxysmal atrial fibrillation (HCC)    PLAN:    In order of problems listed above:  #Paroxysmal atrial fibrillation No recurrence after her May 18, 2021 ablation.  She feels much better.  We discussed stroke risk during today's visit.  I would recommend continuing Eliquis for stroke risk mitigation.  We also discussed using a loop recorder for a "pill  in the pocket" approach but I do not think this is the best option given the lack of data to support this strategy.  She should remain active.  I will plan to see her back in 1 year or sooner as needed.    Follow-up in 1 year.    Medication Adjustments/Labs and Tests Ordered: Current medicines are reviewed at length with the patient today.  Concerns regarding medicines are outlined above.  No orders of the defined types were placed in this encounter.  No orders of the defined types were placed in this encounter.   I,Mathew Stumpf,acting as a Education administrator for Vickie Epley, MD.,have documented all relevant documentation on the behalf of Vickie Epley, MD,as directed by  Vickie Epley, MD while in the presence of Vickie Epley, MD.  I, Vickie Epley, MD, have reviewed all documentation for this visit. The documentation on 08/17/21 for the exam, diagnosis, procedures, and orders are all accurate and complete.   Signed, Lars Mage, MD, [facility], [facility] 08/17/2021 11:36 AM    Electrophysiology [facility]

## 2021-08-17 ENCOUNTER — Encounter: Payer: Self-pay | Admitting: Cardiology

## 2021-08-17 ENCOUNTER — Ambulatory Visit: Payer: PPO | Admitting: Cardiology

## 2021-08-17 VITALS — BP 108/70 | HR 102 | Ht 61.5 in | Wt 106.0 lb

## 2021-08-17 DIAGNOSIS — I480 Paroxysmal atrial fibrillation: Secondary | ICD-10-CM | POA: Diagnosis not present

## 2021-08-17 NOTE — Patient Instructions (Signed)
Medication Instructions:  Your physician recommends that you continue on your current medications as directed. Please refer to the Current Medication list given to you today. *If you need a refill on your cardiac medications before your next appointment, please call your pharmacy*  Lab Work: None. If you have labs (blood work) drawn today and your tests are completely normal, you will receive your results only by: [place] (if you have MyChart) OR A paper copy in the mail If you have any lab test that is abnormal or we need to change your treatment, we will call you to review the results.  Testing/Procedures: None.  Follow-Up: At [facility], you and your health needs are our priority.  As part of our continuing mission to provide you with exceptional heart care, we have created designated Provider Care Teams.  These Care Teams include your primary Cardiologist (physician) and Advanced Practice Providers (APPs -  Physician Assistants and Nurse Practitioners) who all work together to provide you with the care you need, when you need it.  Your physician wants you to follow-up in: 12 months with Lars Mage, MD   We recommend signing up for the patient portal called "MyChart".  Sign up information is provided on this After Visit Summary.  MyChart is used to connect with patients for Virtual Visits (Telemedicine).  Patients are able to view lab/test results, encounter notes, upcoming appointments, etc.  Non-urgent messages can be sent to your provider as well.   To learn more about what you can do with MyChart, go to [email or web address].    Any Other Special Instructions Will Be Listed Below (If Applicable).

## 2021-08-25 DIAGNOSIS — M25561 Pain in right knee: Secondary | ICD-10-CM | POA: Diagnosis not present

## 2021-09-28 DIAGNOSIS — M25561 Pain in right knee: Secondary | ICD-10-CM | POA: Diagnosis not present

## 2021-09-28 DIAGNOSIS — Z96651 Presence of right artificial knee joint: Secondary | ICD-10-CM | POA: Diagnosis not present

## 2021-11-03 DIAGNOSIS — J383 Other diseases of vocal cords: Secondary | ICD-10-CM | POA: Diagnosis not present

## 2021-11-03 DIAGNOSIS — J385 Laryngeal spasm: Secondary | ICD-10-CM | POA: Diagnosis not present

## 2021-11-03 DIAGNOSIS — G249 Dystonia, unspecified: Secondary | ICD-10-CM | POA: Diagnosis not present

## 2021-12-25 DIAGNOSIS — H04123 Dry eye syndrome of bilateral lacrimal glands: Secondary | ICD-10-CM | POA: Diagnosis not present

## 2021-12-25 DIAGNOSIS — H1045 Other chronic allergic conjunctivitis: Secondary | ICD-10-CM | POA: Diagnosis not present

## 2021-12-25 DIAGNOSIS — H524 Presbyopia: Secondary | ICD-10-CM | POA: Diagnosis not present

## 2021-12-25 DIAGNOSIS — H26492 Other secondary cataract, left eye: Secondary | ICD-10-CM | POA: Diagnosis not present

## 2021-12-28 DIAGNOSIS — L570 Actinic keratosis: Secondary | ICD-10-CM | POA: Diagnosis not present

## 2021-12-28 DIAGNOSIS — Z85828 Personal history of other malignant neoplasm of skin: Secondary | ICD-10-CM | POA: Diagnosis not present

## 2022-01-25 DIAGNOSIS — Z96651 Presence of right artificial knee joint: Secondary | ICD-10-CM | POA: Diagnosis not present

## 2022-01-26 DIAGNOSIS — G249 Dystonia, unspecified: Secondary | ICD-10-CM | POA: Diagnosis not present

## 2022-01-26 DIAGNOSIS — R490 Dysphonia: Secondary | ICD-10-CM | POA: Diagnosis not present

## 2022-01-26 DIAGNOSIS — J383 Other diseases of vocal cords: Secondary | ICD-10-CM | POA: Diagnosis not present

## 2022-01-26 DIAGNOSIS — R1310 Dysphagia, unspecified: Secondary | ICD-10-CM | POA: Diagnosis not present

## 2022-01-26 DIAGNOSIS — Z9889 Other specified postprocedural states: Secondary | ICD-10-CM | POA: Diagnosis not present

## 2022-02-14 DIAGNOSIS — G5603 Carpal tunnel syndrome, bilateral upper limbs: Secondary | ICD-10-CM | POA: Diagnosis not present

## 2022-02-14 DIAGNOSIS — M13822 Other specified arthritis, left elbow: Secondary | ICD-10-CM | POA: Diagnosis not present

## 2022-02-14 DIAGNOSIS — M79605 Pain in left leg: Secondary | ICD-10-CM | POA: Diagnosis not present

## 2022-03-01 ENCOUNTER — Encounter (HOSPITAL_COMMUNITY): Payer: Self-pay | Admitting: *Deleted

## 2022-04-25 DIAGNOSIS — M13822 Other specified arthritis, left elbow: Secondary | ICD-10-CM | POA: Diagnosis not present

## 2022-04-25 DIAGNOSIS — M79605 Pain in left leg: Secondary | ICD-10-CM | POA: Diagnosis not present

## 2022-04-25 DIAGNOSIS — G5603 Carpal tunnel syndrome, bilateral upper limbs: Secondary | ICD-10-CM | POA: Diagnosis not present

## 2022-04-27 DIAGNOSIS — J385 Laryngeal spasm: Secondary | ICD-10-CM | POA: Diagnosis not present

## 2022-05-07 DIAGNOSIS — D6869 Other thrombophilia: Secondary | ICD-10-CM | POA: Diagnosis not present

## 2022-05-07 DIAGNOSIS — R490 Dysphonia: Secondary | ICD-10-CM | POA: Diagnosis not present

## 2022-05-07 DIAGNOSIS — J029 Acute pharyngitis, unspecified: Secondary | ICD-10-CM | POA: Diagnosis not present

## 2022-05-07 DIAGNOSIS — R0981 Nasal congestion: Secondary | ICD-10-CM | POA: Diagnosis not present

## 2022-05-07 DIAGNOSIS — Z1152 Encounter for screening for COVID-19: Secondary | ICD-10-CM | POA: Diagnosis not present

## 2022-05-07 DIAGNOSIS — R051 Acute cough: Secondary | ICD-10-CM | POA: Diagnosis not present

## 2022-05-07 DIAGNOSIS — I480 Paroxysmal atrial fibrillation: Secondary | ICD-10-CM | POA: Diagnosis not present

## 2022-05-07 DIAGNOSIS — J069 Acute upper respiratory infection, unspecified: Secondary | ICD-10-CM | POA: Diagnosis not present

## 2022-05-18 ENCOUNTER — Other Ambulatory Visit (HOSPITAL_COMMUNITY): Payer: Self-pay | Admitting: *Deleted

## 2022-05-18 MED ORDER — ELIQUIS 5 MG PO TABS
5.0000 mg | ORAL_TABLET | Freq: Two times a day (BID) | ORAL | 2 refills | Status: DC
Start: 2022-05-18 — End: 2022-10-26

## 2022-05-28 DIAGNOSIS — D2262 Melanocytic nevi of left upper limb, including shoulder: Secondary | ICD-10-CM | POA: Diagnosis not present

## 2022-05-28 DIAGNOSIS — Z85828 Personal history of other malignant neoplasm of skin: Secondary | ICD-10-CM | POA: Diagnosis not present

## 2022-05-28 DIAGNOSIS — Z85820 Personal history of malignant melanoma of skin: Secondary | ICD-10-CM | POA: Diagnosis not present

## 2022-05-28 DIAGNOSIS — D2272 Melanocytic nevi of left lower limb, including hip: Secondary | ICD-10-CM | POA: Diagnosis not present

## 2022-05-28 DIAGNOSIS — L820 Inflamed seborrheic keratosis: Secondary | ICD-10-CM | POA: Diagnosis not present

## 2022-05-28 DIAGNOSIS — D2271 Melanocytic nevi of right lower limb, including hip: Secondary | ICD-10-CM | POA: Diagnosis not present

## 2022-05-28 DIAGNOSIS — L570 Actinic keratosis: Secondary | ICD-10-CM | POA: Diagnosis not present

## 2022-05-28 DIAGNOSIS — L821 Other seborrheic keratosis: Secondary | ICD-10-CM | POA: Diagnosis not present

## 2022-06-13 ENCOUNTER — Telehealth: Payer: Self-pay | Admitting: Cardiology

## 2022-06-13 ENCOUNTER — Other Ambulatory Visit: Payer: Self-pay | Admitting: Internal Medicine

## 2022-06-13 DIAGNOSIS — Z1231 Encounter for screening mammogram for malignant neoplasm of breast: Secondary | ICD-10-CM

## 2022-06-13 NOTE — Telephone Encounter (Signed)
Patient would like to discuss stopping her Eliquis. She has been doing well since her ablation and states that she has not had any episodes of AFIB. She is not scheduled to see Dr. Lalla Brothers until October but wanted to know if she could discuss this sooner. Advised that I could schedule her to see an APP. Patient is currently not at home and states that she will call back to schedule when she gets back.

## 2022-06-13 NOTE — Telephone Encounter (Signed)
Pt c/o medication issue:  1. Name of Medication:  ELIQUIS 5 MG TABS tablet  2. How are you currently taking this medication (dosage and times per day)?   3. Are you having a reaction (difficulty breathing--STAT)?   4. What is your medication issue?   Patient states she would like to discuss discontinuing Eliquis.

## 2022-06-29 ENCOUNTER — Ambulatory Visit: Payer: PPO | Admitting: Physician Assistant

## 2022-07-06 DIAGNOSIS — L259 Unspecified contact dermatitis, unspecified cause: Secondary | ICD-10-CM | POA: Diagnosis not present

## 2022-07-11 DIAGNOSIS — Z0000 Encounter for general adult medical examination without abnormal findings: Secondary | ICD-10-CM | POA: Diagnosis not present

## 2022-07-11 DIAGNOSIS — D689 Coagulation defect, unspecified: Secondary | ICD-10-CM | POA: Diagnosis not present

## 2022-07-11 DIAGNOSIS — R230 Cyanosis: Secondary | ICD-10-CM | POA: Diagnosis not present

## 2022-07-11 DIAGNOSIS — I480 Paroxysmal atrial fibrillation: Secondary | ICD-10-CM | POA: Diagnosis not present

## 2022-07-18 DIAGNOSIS — M159 Polyosteoarthritis, unspecified: Secondary | ICD-10-CM | POA: Diagnosis not present

## 2022-07-18 DIAGNOSIS — M48061 Spinal stenosis, lumbar region without neurogenic claudication: Secondary | ICD-10-CM | POA: Diagnosis not present

## 2022-07-18 DIAGNOSIS — R490 Dysphonia: Secondary | ICD-10-CM | POA: Diagnosis not present

## 2022-07-18 DIAGNOSIS — M8580 Other specified disorders of bone density and structure, unspecified site: Secondary | ICD-10-CM | POA: Diagnosis not present

## 2022-07-18 DIAGNOSIS — Z0000 Encounter for general adult medical examination without abnormal findings: Secondary | ICD-10-CM | POA: Diagnosis not present

## 2022-07-18 DIAGNOSIS — I480 Paroxysmal atrial fibrillation: Secondary | ICD-10-CM | POA: Diagnosis not present

## 2022-07-18 DIAGNOSIS — D6869 Other thrombophilia: Secondary | ICD-10-CM | POA: Diagnosis not present

## 2022-07-18 DIAGNOSIS — Z1339 Encounter for screening examination for other mental health and behavioral disorders: Secondary | ICD-10-CM | POA: Diagnosis not present

## 2022-07-18 DIAGNOSIS — Z23 Encounter for immunization: Secondary | ICD-10-CM | POA: Diagnosis not present

## 2022-07-18 DIAGNOSIS — Z1331 Encounter for screening for depression: Secondary | ICD-10-CM | POA: Diagnosis not present

## 2022-07-18 DIAGNOSIS — G4700 Insomnia, unspecified: Secondary | ICD-10-CM | POA: Diagnosis not present

## 2022-07-18 DIAGNOSIS — R7301 Impaired fasting glucose: Secondary | ICD-10-CM | POA: Diagnosis not present

## 2022-07-18 NOTE — Progress Notes (Signed)
  Electrophysiology Office Note:   Date:  07/19/2022  ID:  Catherine [name], DOB 10/15/1945, MRN [id number]  Primary Cardiologist: None Electrophysiologist: Lanier Prude, MD      History of Present Illness:   Catherine [name] is a 77 y.o. female with h/o PAF s/p ablation 2023 seen today for routine electrophysiology followup.   Since last being seen in our clinic the patient reports doing very well.  she denies chest pain, palpitations, dyspnea, PND, orthopnea, nausea, vomiting, dizziness, syncope, edema, weight gain, or early satiety.  She uses an apple watch and has not noted any AF.   Unfortunately, her LAA anatomy was not favorable for Watchman.   She continues to struggle with pain from arthritis and is frustrated NSAID use is limited.  Review of systems complete and found to be negative unless listed in HPI.   Studies Reviewed:    EKG is ordered today. Personal review as below.  EKG Interpretation  Date/Time:  Thursday July 19 2022 08:23:29 EDT Ventricular Rate:  83 PR Interval:  190 QRS Duration: 84 QT Interval:  368 QTC Calculation: 432 R Axis:   80 Text Interpretation: Normal sinus rhythm Small anterior q waves previously noted. When compared with ECG of 15-Jun-2021 10:29, No significant change was found Confirmed by Maxine Glenn [phone]) on 07/19/2022 8:29:23 AM        Physical Exam:   VS:  BP 106/70   Pulse (!) 50   Ht 5' 1.5" (1.562 m)   Wt 105 lb 9.6 oz (47.9 kg)   SpO2 99%   BMI 19.63 kg/m    Wt Readings from Last 3 Encounters:  07/19/22 105 lb 9.6 oz (47.9 kg)  08/17/21 106 lb (48.1 kg)  06/15/21 106 lb 12.8 oz (48.4 kg)     GEN: Well nourished, well developed in no acute distress NECK: No JVD; No carotid bruits CARDIAC: Regular rate and rhythm, no murmurs, rubs, gallops RESPIRATORY:  Clear to auscultation without rales, wheezing or rhonchi  ABDOMEN: Soft, non-tender, non-distended EXTREMITIES:  No edema; No deformity   ASSESSMENT AND PLAN:     Paroxysmal atrial fibrillation S/p ablation 05/18/2021 Continue Eliquis 5 mg BID CHA2DS2/VASc is currently 3. We again discussed that data does not currently support stopping OAC.    Arthritis Wishes to take NSAIDs Currently on diazepam and hydrocodone/tylenol + tramadol  Follow up with Dr. Lalla Brothers as scheduled as she wishes to further explore the idea of a loop recorder and watchful waiting given now >1 year without symptomatic AF.   Signed, Graciella Freer, PA-C

## 2022-07-19 ENCOUNTER — Other Ambulatory Visit: Payer: Self-pay | Admitting: Internal Medicine

## 2022-07-19 ENCOUNTER — Encounter: Payer: Self-pay | Admitting: Student

## 2022-07-19 ENCOUNTER — Ambulatory Visit: Payer: PPO | Attending: Student | Admitting: Student

## 2022-07-19 VITALS — BP 106/70 | HR 50 | Ht 61.5 in | Wt 105.6 lb

## 2022-07-19 DIAGNOSIS — I480 Paroxysmal atrial fibrillation: Secondary | ICD-10-CM

## 2022-07-19 DIAGNOSIS — M1990 Unspecified osteoarthritis, unspecified site: Secondary | ICD-10-CM

## 2022-07-19 DIAGNOSIS — M8580 Other specified disorders of bone density and structure, unspecified site: Secondary | ICD-10-CM

## 2022-07-19 DIAGNOSIS — D6869 Other thrombophilia: Secondary | ICD-10-CM | POA: Diagnosis not present

## 2022-07-19 NOTE — Patient Instructions (Signed)
Medication Instructions:  Your physician recommends that you continue on your current medications as directed. Please refer to the Current Medication list given to you today.  *If you need a refill on your cardiac medications before your next appointment, please call your pharmacy*  Lab Work: None ordered If you have labs (blood work) drawn today and your tests are completely normal, you will receive your results only by: MyChart Message (if you have MyChart) OR A paper copy in the mail If you have any lab test that is abnormal or we need to change your treatment, we will call you to review the results.  Follow-Up: At [facility], you and your health needs are our priority.  As part of our continuing mission to provide you with exceptional heart care, we have created designated Provider Care Teams.  These Care Teams include your primary Cardiologist (physician) and Advanced Practice Providers (APPs -  Physician Assistants and Nurse Practitioners) who all work together to provide you with the care you need, when you need it.  Your next appointment:   10/26/2022 at 9:45 AM  Provider:   Steffanie Dunn, MD

## 2022-08-01 ENCOUNTER — Ambulatory Visit: Payer: PPO

## 2022-08-09 ENCOUNTER — Ambulatory Visit
Admission: RE | Admit: 2022-08-09 | Discharge: 2022-08-09 | Disposition: A | Discharge status: Home / Self Care | Payer: PPO | Source: Ambulatory Visit | Attending: Internal Medicine | Admitting: Internal Medicine

## 2022-08-09 DIAGNOSIS — Z1231 Encounter for screening mammogram for malignant neoplasm of breast: Secondary | ICD-10-CM

## 2022-08-10 DIAGNOSIS — J385 Laryngeal spasm: Secondary | ICD-10-CM | POA: Diagnosis not present

## 2022-08-30 DIAGNOSIS — M13822 Other specified arthritis, left elbow: Secondary | ICD-10-CM | POA: Diagnosis not present

## 2022-10-08 DIAGNOSIS — M4306 Spondylolysis, lumbar region: Secondary | ICD-10-CM | POA: Diagnosis not present

## 2022-10-25 NOTE — Progress Notes (Signed)
Electrophysiology Office Follow up Visit Note:    Date:  10/26/2022   ID:  Catherine [name], DOB 03/04/1945, MRN [id number]  PCP:  Cleatis Polka., MD  [facility] Cardiologist:  None  [facility] Electrophysiologist:  Lanier Prude, MD    Interval History:    Catherine [name] is a 77 y.o. female who presents for a follow up visit.   She had an ablation May 18, 2021 during which the veins were isolated. She saw Mardelle Matte July 19, 2022 for follow-up.  At that appointment she had not had any recurrence of arrhythmia and was interested in stopping anticoagulation.  She inquired about using a loop recorder or wearable device for A-fib surveillance.  Today she is with her husband in clinic.  She is interested in avoiding long-term use of Eliquis.  She wants to get back on her NSAIDs for joint pain.    Past medical, surgical, social and family history were reviewed.  ROS:   Please see the history of present illness.    All other systems reviewed and are negative.  EKGs/Labs/Other Studies Reviewed:    The following studies were reviewed today:   EKG Interpretation Date/Time:  Friday October 26 2022 09:49:02 EDT Ventricular Rate:  78 PR Interval:  182 QRS Duration:  74 QT Interval:  372 QTC Calculation: 424 R Axis:   69  Text Interpretation: Normal sinus rhythm Low voltage QRS Confirmed by Steffanie Dunn [phone]) on 10/26/2022 9:53:27 AM    Physical Exam:    VS:  BP 124/72   Pulse 82   Ht 5' 0.5" (1.537 m)   Wt 104 lb (47.2 kg)   SpO2 99%   BMI 19.98 kg/m     Wt Readings from Last 3 Encounters:  10/26/22 104 lb (47.2 kg)  07/19/22 105 lb 9.6 oz (47.9 kg)  08/17/21 106 lb (48.1 kg)     GEN:  Well nourished, well developed in no acute distress CARDIAC: RRR, no murmurs, rubs, gallops RESPIRATORY:  Clear to auscultation without rales, wheezing or rhonchi       ASSESSMENT:    1. Paroxysmal atrial fibrillation (HCC)    PLAN:    In order of problems  listed above:  #Paroxysmal atrial fibrillation Post ablation May 18, 2021 On Eliquis for stroke prophylaxis but wishes to avoid long-term exposure to anticoagulation if possible.  Has asked about the possibility of using a loop recorder or wearable monitor for rhythm surveillance. We had a nice long conversation today about options for rhythm surveillance.  We discussed Apple and Samsung watches, loop recorder.  We discussed the limitations in this sort of strategy.  We discussed the lack of available data supporting a "pill in the pocket" approach to anticoagulation.  After our discussion, the patient is very clear that she would like to proceed with stopping her Eliquis and using her Apple Watch for rhythm surveillance.  She should immediately notify us if she gets an alert for atrial fibrillation and restart her anticoagulation.  #Arthritis Plans to restart Celebrex once she is off her anticoagulant.   Follow-up 1 year.   Signed, Steffanie Dunn, MD, [facility], [facility] 10/26/2022 9:53 AM    Electrophysiology [facility]

## 2022-10-26 ENCOUNTER — Ambulatory Visit: Payer: PPO | Attending: Cardiology | Admitting: Cardiology

## 2022-10-26 ENCOUNTER — Encounter: Payer: Self-pay | Admitting: Cardiology

## 2022-10-26 VITALS — BP 124/72 | HR 82 | Ht 60.5 in | Wt 104.0 lb

## 2022-10-26 DIAGNOSIS — M1990 Unspecified osteoarthritis, unspecified site: Secondary | ICD-10-CM

## 2022-10-26 DIAGNOSIS — I480 Paroxysmal atrial fibrillation: Secondary | ICD-10-CM

## 2022-10-26 NOTE — Patient Instructions (Signed)
Medication Instructions:  Your physician has recommended you make the following change in your medication:  1) STOP taking Eliquis  *If you need a refill on your cardiac medications before your next appointment, please call your pharmacy*   Follow-Up: At [facility], you and your health needs are our priority.  As part of our continuing mission to provide you with exceptional heart care, we have created designated Provider Care Teams.  These Care Teams include your primary Cardiologist (physician) and Advanced Practice Providers (APPs -  Physician Assistants and Nurse Practitioners) who all work together to provide you with the care you need, when you need it.  Your next appointment:   1 year  Provider:   You will see one of the following Advanced Practice Providers on your designated Care Team:   Francis Dowse, Charlott Holler "Mardelle Matte" [place], [place] Sherie Don, NP Canary Brim, NP  Other Instructions Please wear your Apple Watch as often as possible to monitor your heart rhythm

## 2022-11-20 DIAGNOSIS — M19012 Primary osteoarthritis, left shoulder: Secondary | ICD-10-CM | POA: Diagnosis not present

## 2022-11-23 DIAGNOSIS — J385 Laryngeal spasm: Secondary | ICD-10-CM | POA: Diagnosis not present

## 2022-11-26 DIAGNOSIS — L821 Other seborrheic keratosis: Secondary | ICD-10-CM | POA: Diagnosis not present

## 2022-11-26 DIAGNOSIS — L570 Actinic keratosis: Secondary | ICD-10-CM | POA: Diagnosis not present

## 2022-11-26 DIAGNOSIS — B0089 Other herpesviral infection: Secondary | ICD-10-CM | POA: Diagnosis not present

## 2022-11-26 DIAGNOSIS — D225 Melanocytic nevi of trunk: Secondary | ICD-10-CM | POA: Diagnosis not present

## 2022-11-26 DIAGNOSIS — Z85820 Personal history of malignant melanoma of skin: Secondary | ICD-10-CM | POA: Diagnosis not present

## 2022-11-26 DIAGNOSIS — Z85828 Personal history of other malignant neoplasm of skin: Secondary | ICD-10-CM | POA: Diagnosis not present

## 2022-11-26 DIAGNOSIS — L989 Disorder of the skin and subcutaneous tissue, unspecified: Secondary | ICD-10-CM | POA: Diagnosis not present

## 2022-11-26 DIAGNOSIS — D485 Neoplasm of uncertain behavior of skin: Secondary | ICD-10-CM | POA: Diagnosis not present

## 2022-12-11 DIAGNOSIS — M19012 Primary osteoarthritis, left shoulder: Secondary | ICD-10-CM | POA: Diagnosis not present

## 2022-12-11 DIAGNOSIS — M25512 Pain in left shoulder: Secondary | ICD-10-CM | POA: Diagnosis not present

## 2022-12-13 DIAGNOSIS — M25512 Pain in left shoulder: Secondary | ICD-10-CM | POA: Diagnosis not present

## 2022-12-17 ENCOUNTER — Telehealth: Payer: Self-pay | Admitting: *Deleted

## 2022-12-17 DIAGNOSIS — Z961 Presence of intraocular lens: Secondary | ICD-10-CM | POA: Diagnosis not present

## 2022-12-17 DIAGNOSIS — H5213 Myopia, bilateral: Secondary | ICD-10-CM | POA: Diagnosis not present

## 2022-12-17 DIAGNOSIS — M19012 Primary osteoarthritis, left shoulder: Secondary | ICD-10-CM | POA: Diagnosis not present

## 2022-12-17 DIAGNOSIS — H04123 Dry eye syndrome of bilateral lacrimal glands: Secondary | ICD-10-CM | POA: Diagnosis not present

## 2022-12-17 NOTE — Telephone Encounter (Signed)
   Pre-operative Risk Assessment    Patient Name: Catherine [name]  DOB: 04/08/1945 MRN: [id number]  DATE OF LAST VISIT: 10/26/22 DR. Lalla Brothers DATE OF NEXT VISIT: NONE    Request for Surgical Clearance    Procedure:   LEFT REVERSE SHOULDER ARTHROPLASTY  Date of Surgery:  Clearance TBD                                 Surgeon:  DR. Caryn Bee SUPPLE Surgeon's Group or Practice Name:  [facility] Phone number:  [phone] [facility] Fax number:  [phone]   Type of Clearance Requested:   - Medical ; NONE INDICATED TO BE HELD   Type of Anesthesia:  General    Additional requests/questions:    [name]   12/17/2022, 5:48 PM

## 2022-12-25 ENCOUNTER — Encounter: Payer: Self-pay | Admitting: Orthopaedic Surgery

## 2022-12-25 NOTE — Telephone Encounter (Signed)
   Primary Cardiologist: None  Chart reviewed as part of pre-operative protocol coverage. Given past medical history and time since last visit, based on ACC/AHA guidelines, Catherine [name] would be at acceptable risk for the planned procedure without further cardiovascular testing.   Patient was advised that if she develops new symptoms prior to surgery to contact our office to arrange a follow-up appointment. She verbalized understanding.  I will route this recommendation to the requesting party via Epic fax function and remove from pre-op pool.  Please call with questions.  Levi Aland, NP-C  12/25/2022, 8:21 AM [address], [address] Office [phone] Fax [phone]

## 2022-12-27 ENCOUNTER — Ambulatory Visit
Admission: RE | Admit: 2022-12-27 | Discharge: 2022-12-27 | Disposition: A | Discharge status: Home / Self Care | Payer: PPO | Source: Ambulatory Visit | Attending: Orthopaedic Surgery | Admitting: Orthopaedic Surgery

## 2022-12-27 ENCOUNTER — Other Ambulatory Visit: Payer: Self-pay | Admitting: Orthopaedic Surgery

## 2022-12-27 DIAGNOSIS — M11212 Other chondrocalcinosis, left shoulder: Secondary | ICD-10-CM | POA: Diagnosis not present

## 2022-12-27 DIAGNOSIS — G8929 Other chronic pain: Secondary | ICD-10-CM

## 2022-12-27 DIAGNOSIS — M19012 Primary osteoarthritis, left shoulder: Secondary | ICD-10-CM | POA: Diagnosis not present

## 2022-12-28 ENCOUNTER — Telehealth: Payer: Self-pay | Admitting: Cardiology

## 2022-12-28 NOTE — Telephone Encounter (Signed)
I did not need this encounter. °

## 2022-12-28 NOTE — Telephone Encounter (Signed)
Confirmed with Cordelia Pen from [facility] that they have received the clearance.

## 2022-12-28 NOTE — Telephone Encounter (Signed)
Clearance was faxed on 12/17/22.   Callback - can you please confirm fax received by requesting office?

## 2022-12-28 NOTE — Telephone Encounter (Signed)
Patient is following up regarding a clearance request sent by [facility], Dr. Ramond Marrow. She states it is for a reverse shoulder replacement and it was sent before Thanksgiving and again a few days ago. Patient would like updates.  Patient provided a phone number for [facility], [phone] (ext#: [phone]).

## 2022-12-29 NOTE — H&P (Signed)
PREOPERATIVE H&P  Chief Complaint: LEFT SHOULDER OSTEOARTHRITIS  HPI: Catherine [name] is a 77 y.o. female who is scheduled for, Procedure(s): REVERSE SHOULDER ARTHROPLASTY.   Patient has a past medical history significant for  PAF s/p ablation 2023, DDD, knee and shoulder OA.   The patient is a very pleasant 77 year old female who has had left shoulder pain for some time. She had  been seen by Dr. Rennis Chris and told she might need a reverse total shoulder arthroplasty but based on scheduling and personal references she wanted to consider an option for me to take over her care. She is here for a second opinion. Her husband,  J.D. Hart Rochester, MD is a retired Physiological scientist. They have dealt with the shoulder for some time with injections and physical therapy and she has failed. She has overall very few medical issues. 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 She has Botox injections into the vocal cords every 3-6 months.  We are hear to talk about her shoulder today. She continues to have pain. She has trouble with overhead activities. She feels like her function is getting worse. They have a trip to [place] coming up in mid January.   Symptoms are rated as moderate to severe, and have been worsening.  This is significantly impairing activities of daily living.    Please see clinic note for further details on this patient's care.    She has elected for surgical management.   Past Medical History:  Diagnosis Date   Abnormal glandular Papanicolaou smear of cervix 11/04/2014   Arthritis    Atrial fibrillation (HCC) 06/2020   Back pain    CIN I (cervical intraepithelial neoplasia I)    LEEP 2006 margins free      negative HR HPV 2008    Complication of anesthesia    Difficult intubation    needs pediatric equipment   Dysrhythmia    afib   Fever blister    Herniated nucleus pulposus, L5-S1 08/10/2014   Insomnia    Neck pain    Pseudoarthrosis of lumbar  spine    Spasmodic dysphonia    Spondylolysis, lumbosacral 08/13/2014   Spondylitic Stenosis   Past Surgical History:  Procedure Laterality Date   ABDOMINAL EXPOSURE N/A 01/23/2017   Procedure: ABDOMINAL EXPOSURE;  Surgeon: Larina Earthly, MD;  Location: [facility] OR;  Service: Vascular;  Laterality: N/A;   ANTERIOR LUMBAR FUSION N/A 01/23/2017   Procedure: Revision of Lumbar five-Sacral One Fusion with Anterior lumbar interbody fusion, Dr. Tawanna Cooler Early to co surgeon;  Surgeon: Barnett Abu, MD;  Location: [facility] OR;  Service: Neurosurgery;  Laterality: N/A;   ATRIAL FIBRILLATION ABLATION N/A 05/18/2021   Procedure: ATRIAL FIBRILLATION ABLATION;  Surgeon: Lanier Prude, MD;  Location: [facility] INVASIVE CV LAB;  Service: Cardiovascular;  Laterality: N/A;   BACK SURGERY     Fusion   CATARACT EXTRACTION W/ INTRAOCULAR LENS  IMPLANT, BILATERAL     CERVICAL BIOPSY  W/ LOOP ELECTRODE EXCISION  2006   CERVICAL CONIZATION W/BX N/A 12/28/2014   Procedure: CONIZATION CERVIX WITH BIOPSY;  Surgeon: Ok Edwards, MD;  Location: [facility] ORS;  Service: Gynecology;  Laterality: N/A;   COLONOSCOPY     COLPOSCOPY     DILATION AND CURETTAGE OF UTERUS     HARDWARE REMOVAL N/A 06/17/2018   Procedure: Removal of bilateral iliac fixation;  Surgeon: Barnett Abu, MD;  Location: [facility] OR;  Service: Neurosurgery;  Laterality:  N/A;  Removal of bilateral iliac fixation   HYSTEROSCOPY WITH D & C N/A 12/28/2014   Procedure: DILATATION AND CURETTAGE /HYSTEROSCOPY Diagnostic Hysteroscopy;  Surgeon: Ok Edwards, MD;  Location: [facility] ORS;  Service: Gynecology;  Laterality: N/A;   JOINT REPLACEMENT     Left knee   KNEE SURGERY     Rt-95,Lft.-98,Replacement-09   LUMBAR LAMINECTOMY/DECOMPRESSION MICRODISCECTOMY Bilateral 08/10/2014   Procedure: Bilateral Lumbar five-Sacral one Diskectomy;  Surgeon: Barnett Abu, MD;  Location: [facility] NEURO ORS;  Service: Neurosurgery;  Laterality: Bilateral;  Bilateral L5-S1 Diskectomy   ROTATOR CUFF REPAIR  2004    TONSILLECTOMY     TOTAL KNEE ARTHROPLASTY Right 11/28/2020   Procedure: TOTAL KNEE ARTHROPLASTY;  Surgeon: Ollen Gross, MD;  Location: [facility] ORS;  Service: Orthopedics;  Laterality: Right;   TUBAL LIGATION     Social History   Socioeconomic History   Marital status: Married    Spouse name: Not on file   Number of children: Not on file   Years of education: Not on file   Highest education level: Not on file  Occupational History   Not on file  Tobacco Use   Smoking status: Never   Smokeless tobacco: Never  Vaping Use   Vaping status: Never Used  Substance and Sexual Activity   Alcohol use: Yes    Alcohol/week: 7.0 - 14.0 standard drinks of alcohol    Types: 7 - 14 Standard drinks or equivalent per week    Comment: 1-2 drinks daily 03/06/21   Drug use: No   Sexual activity: Yes    Birth control/protection: Post-menopausal, Surgical    Comment: INSURANCE QUESTIONS DECLINED  Other Topics Concern   Not on file  Social History Narrative   Not on file   Social Determinants of Health   Financial Resource Strain: Not on file  Food Insecurity: Not on file  Transportation Needs: Not on file  Physical Activity: Not on file  Stress: Not on file  Social Connections: Not on file   Family History  Problem Relation Age of Onset   Hypertension Mother    Dementia Mother    Hypertension Father    Diabetes Father    Heart disease Father    Heart failure Father    Parkinson's disease Brother    Breast cancer Neg Hx    Colon cancer Neg Hx    Esophageal cancer Neg Hx    Stomach cancer Neg Hx    Allergies  Allergen Reactions   Oxycodone Other (See Comments)    Hallucinations   Colchicine Diarrhea and Nausea Only   Prior to Admission medications   Medication Sig Start Date End Date Taking? Authorizing Provider  acetaminophen (TYLENOL) 500 MG tablet Take 1,000 mg by mouth every 6 (six) hours as needed for moderate pain or mild pain.    [provider]   cholecalciferol (VITAMIN D) 25 MCG (1000 UNIT) tablet Take 1,000 Units by mouth daily.    [provider]  diazepam (VALIUM) 2 MG tablet Take 2 mg by mouth 2 (two) times daily as needed for muscle spasms. 03/17/21   [provider]  diltiazem (CARDIZEM) 30 MG tablet Take 1 tablet every 4 hours AS NEEDED for HR >100 as long as top BP >100. 07/05/20   Fenton, Clint R, PA  loratadine (CLARITIN) 10 MG tablet Take 10 mg by mouth daily as needed for allergies.    [provider]  Probiotic Product (ALIGN) 4 MG CAPS Take 4 mg by  mouth daily.    [provider]  traMADol (ULTRAM) 50 MG tablet Take 1-2 tablets (50-100 mg total) by mouth every 6 (six) hours as needed for moderate pain. 11/29/20   Edmisten, Lyn Hollingshead, PA  vitamin B-12 (CYANOCOBALAMIN) 1000 MCG tablet Take 1,000 mcg by mouth daily.    [provider]  zolpidem (AMBIEN) 10 MG tablet Take 10 mg by mouth at bedtime.  06/24/14   [provider]    ROS: All other systems have been reviewed and were otherwise negative with the exception of those mentioned in the HPI and as above.  Physical Exam: General: Alert, no acute distress Cardiovascular: No pedal edema Respiratory: No cyanosis, no use of accessory musculature GI: No organomegaly, abdomen is soft and non-tender Skin: No lesions in the area of chief complaint Neurologic: Sensation intact distally Psychiatric: Patient is competent for consent with normal mood and affect Lymphatic: No axillary or cervical lymphadenopathy  MUSCULOSKELETAL:  The range of motion of the shoulder is to 100 degrees, passive 160. Cuff strength is weak throughout with supraspinatus and infraspinatus testing. Distal motor and sensory function intact.   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 Additionally, there is significant osteoarthritis of the joint and early acetabularization of the acromion.     Assessment: LEFT SHOULDER OSTEOARTHRITIS  Plan: Plan for Procedure(s): REVERSE SHOULDER ARTHROPLASTY    The risks benefits and alternatives were discussed with the patient including but not limited to the risks of nonoperative treatment, versus surgical intervention including infection, bleeding, nerve injury,  blood clots, cardiopulmonary complications, morbidity, mortality, among others, and they were willing to proceed.   The patient acknowledged the explanation, agreed to proceed with the plan and consent was signed.   Operative Plan: Left total shoulder arthroplasty Discharge Medications: Hydrocodone, tylenol, zofran DVT Prophylaxis: ASA 81mg  BID x 6 weeks Physical Therapy: outpatient Special Discharge needs: Sling, Gennaro Africa, PA-C  12/29/2022 10:52 AM

## 2023-01-01 NOTE — Patient Instructions (Signed)
SURGICAL WAITING ROOM VISITATION  Patients having surgery or a procedure may have no more than 2 support people in the waiting area - these visitors may rotate.    Children under the age of 75 must have an adult with them who is not the patient.  Due to an increase in RSV and influenza rates and associated hospitalizations, children ages 63 and under may not visit patients in [facility] hospitals.  If the patient needs to stay at the hospital during part of their recovery, the visitor guidelines for inpatient rooms apply. Pre-op nurse will coordinate an appropriate time for 1 support person to accompany patient in pre-op.  This support person may not rotate.    Please refer to the [facility] website for the visitor guidelines for Inpatients (after your surgery is over and you are in a regular room).       Your procedure is scheduled on: 01/08/23   Report to [facility] Main Entrance    Report to admitting at 12:15 PM   Call this number if you have problems the morning of surgery [phone]   Do not eat food or drink liquids :After Midnight.        Oral Hygiene is also important to reduce your risk of infection.                                    Remember - BRUSH YOUR TEETH THE MORNING OF SURGERY WITH YOUR REGULAR TOOTHPASTE  DENTURES WILL BE REMOVED PRIOR TO SURGERY PLEASE DO NOT APPLY "Poly grip" OR ADHESIVES!!!   Stop all vitamins and herbal supplements 7 days before surgery.   Take these medicines the morning of surgery with A SIP OF WATER: Loratidin(CLARITIN)             You may not have any metal on your body including hair pins, jewelry, and body piercing             Do not wear make-up, lotions, powders, perfumes/cologne, or deodorant  Do not wear nail polish including gel and S&S, artificial/acrylic nails, or any other type of covering on natural nails including finger and toenails. If you have artificial nails, gel coating, etc. that needs to be removed  by a nail salon please have this removed prior to surgery or surgery may need to be canceled/ delayed if the surgeon/ anesthesia feels like they are unable to be safely monitored.   Do not shave  48 hours prior to surgery.               Men may shave face and neck.   Do not bring valuables to the hospital. [place] IS NOT             RESPONSIBLE   FOR VALUABLES.   Contacts, glasses, dentures or bridgework may not be worn into surgery.  DO NOT BRING YOUR HOME MEDICATIONS TO THE HOSPITAL. PHARMACY WILL DISPENSE MEDICATIONS LISTED ON YOUR MEDICATION LIST TO YOU DURING YOUR ADMISSION IN THE HOSPITAL!    Patients discharged on the day of surgery will not be allowed to drive home.  Someone NEEDS to stay with you for the first 24 hours after anesthesia.   Special Instructions: Bring a copy of your healthcare power of attorney and living will documents the day of surgery if you haven't scanned them before.  Please read over the following fact sheets you were given: IF YOU HAVE QUESTIONS ABOUT YOUR PRE-OP INSTRUCTIONS PLEASE CALL [phone]   If you received a COVID test during your pre-op visit  it is requested that you wear a mask when out in public, stay away from anyone that may not be feeling well and notify your surgeon if you develop symptoms. If you test positive for Covid or have been in contact with anyone that has tested positive in the last 10 days please notify you surgeon.      Pre-operative 5 CHG Bath Instructions   You can play a key role in reducing the risk of infection after surgery. Your skin needs to be as free of germs as possible. You can reduce the number of germs on your skin by washing with CHG (chlorhexidine gluconate) soap before surgery. CHG is an antiseptic soap that kills germs and continues to kill germs even after washing.   DO NOT use if you have an allergy to chlorhexidine/CHG or antibacterial soaps. If your skin becomes reddened or irritated,  stop using the CHG and notify one of our RNs at [phone].   Please shower with the CHG soap starting 4 days before surgery using the following schedule:     Please keep in mind the following:  DO NOT shave, including legs and underarms, starting the day of your first shower.   You may shave your face at any point before/day of surgery.  Place clean sheets on your bed the day you start using CHG soap. Use a clean washcloth (not used since being washed) for each shower. DO NOT sleep with pets once you start using the CHG.   CHG Shower Instructions:  If you choose to wash your hair and private area, wash first with your normal shampoo/soap.  After you use shampoo/soap, rinse your hair and body thoroughly to remove shampoo/soap residue.  Turn the water OFF and apply about 3 tablespoons (45 ml) of CHG soap to a CLEAN washcloth.  Apply CHG soap ONLY FROM YOUR NECK DOWN TO YOUR TOES (washing for 3-5 minutes)  DO NOT use CHG soap on face, private areas, open wounds, or sores.  Pay special attention to the area where your surgery is being performed.  If you are having back surgery, having someone wash your back for you may be helpful. Wait 2 minutes after CHG soap is applied, then you may rinse off the CHG soap.  Pat dry with a clean towel  Put on clean clothes/pajamas   If you choose to wear lotion, please use ONLY the CHG-compatible lotions on the back of this paper.     Additional instructions for the day of surgery: DO NOT APPLY any lotions, deodorants, cologne, or perfumes.   Put on clean/comfortable clothes.  Brush your teeth.  Ask your nurse before applying any prescription medications to the skin.      CHG Compatible Lotions   Aveeno Moisturizing lotion  Cetaphil Moisturizing Cream  Cetaphil Moisturizing Lotion  Clairol Herbal Essence Moisturizing Lotion, Dry Skin  Clairol Herbal Essence Moisturizing Lotion, Extra Dry Skin  Clairol Herbal Essence Moisturizing Lotion,  Normal Skin  Curel Age Defying Therapeutic Moisturizing Lotion with Alpha Hydroxy  Curel Extreme Care Body Lotion  Curel Soothing Hands Moisturizing Hand Lotion  Curel Therapeutic Moisturizing Cream, Fragrance-Free  Curel Therapeutic Moisturizing Lotion, Fragrance-Free  Curel Therapeutic Moisturizing Lotion, Original Formula  Eucerin Daily Replenishing Lotion  Eucerin Dry Skin Therapy Plus Alpha Hydroxy Crme  Eucerin  Dry Skin Therapy Plus Alpha Hydroxy Lotion  Eucerin Original Crme  Eucerin Original Lotion  Eucerin Plus Crme Eucerin Plus Lotion  Eucerin TriLipid Replenishing Lotion  Keri Anti-Bacterial Hand Lotion  Keri Deep Conditioning Original Lotion Dry Skin Formula Softly Scented  Keri Deep Conditioning Original Lotion, Fragrance Free Sensitive Skin Formula  Keri Lotion Fast Absorbing Fragrance Free Sensitive Skin Formula  Keri Lotion Fast Absorbing Softly Scented Dry Skin Formula  Keri Original Lotion  Keri Skin Renewal Lotion Keri Silky Smooth Lotion  Keri Silky Smooth Sensitive Skin Lotion  Nivea Body Creamy Conditioning Oil  Nivea Body Extra Enriched Lotion  Nivea Body Original Lotion  Nivea Body Sheer Moisturizing Lotion Nivea Crme  Nivea Skin Firming Lotion  NutraDerm 30 Skin Lotion  NutraDerm Skin Lotion  NutraDerm Therapeutic Skin Cream  NutraDerm Therapeutic Skin Lotion  ProShield Protective Hand Cream   Incentive Spirometer (Watch this video at home: [email or web address])  An incentive spirometer is a tool that can help keep your lungs clear and active. This tool measures how well you are filling your lungs with each breath. Taking long deep breaths may help reverse or decrease the chance of developing breathing (pulmonary) problems (especially infection) following: A long period of time when you are unable to move or be active. BEFORE THE PROCEDURE  If the spirometer includes an indicator to show your best effort, your nurse or  respiratory therapist will set it to a desired goal. If possible, sit up straight or lean slightly forward. Try not to slouch. Hold the incentive spirometer in an upright position. INSTRUCTIONS FOR USE  Sit on the edge of your bed if possible, or sit up as far as you can in bed or on a chair. Hold the incentive spirometer in an upright position. Breathe out normally. Place the mouthpiece in your mouth and seal your lips tightly around it. Breathe in slowly and as deeply as possible, raising the piston or the ball toward the top of the column. Hold your breath for 3-5 seconds or for as long as possible. Allow the piston or ball to fall to the bottom of the column. Remove the mouthpiece from your mouth and breathe out normally. Rest for a few seconds and repeat Steps 1 through 7 at least 10 times every 1-2 hours when you are awake. Take your time and take a few normal breaths between deep breaths. The spirometer may include an indicator to show your best effort. Use the indicator as a goal to work toward during each repetition. After each set of 10 deep breaths, practice coughing to be sure your lungs are clear. If you have an incision (the cut made at the time of surgery), support your incision when coughing by placing a pillow or rolled up towels firmly against it. Once you are able to get out of bed, walk around indoors and cough well. You may stop using the incentive spirometer when instructed by your caregiver.  RISKS AND COMPLICATIONS Take your time so you do not get dizzy or light-headed. If you are in pain, you may need to take or ask for pain medication before doing incentive spirometry. It is harder to take a deep breath if you are having pain. AFTER USE Rest and breathe slowly and easily. It can be helpful to keep track of a log of your progress. Your caregiver can provide you with a simple table to help with this. If you are using the spirometer at home, follow these  instructions: [facility] MEDICAL CARE  IF:  You are having difficultly using the spirometer. You have trouble using the spirometer as often as instructed. Your pain medication is not giving enough relief while using the spirometer. You develop fever of 100.5 F (38.1 C) or higher. SEEK IMMEDIATE MEDICAL CARE IF:  You cough up bloody sputum that had not been present before. You develop fever of 102 F (38.9 C) or greater. You develop worsening pain at or near the incision site. MAKE SURE YOU:  Understand these instructions. Will watch your condition. Will get help right away if you are not doing well or get worse. Document Released: 05/21/2006 Document Revised: 04/02/2011 Document Reviewed: 07/22/2006 [facility] Patient Information 2014 [place], [place]- Preparing for Total Shoulder Arthroplasty    Before surgery, you can play an important role. Because skin is not sterile, your skin needs to be as free of germs as possible. You can reduce the number of germs on your skin by using the following products. Benzoyl Peroxide Gel Reduces the number of germs present on the skin Applied twice a day to shoulder area starting two days before surgery    ==================================================================  Please follow these instructions carefully:  BENZOYL PEROXIDE 5% GEL  Please do not use if you have an allergy to benzoyl peroxide.   If your skin becomes reddened/irritated stop using the benzoyl peroxide.  Starting two days before surgery, apply as follows: Apply benzoyl peroxide in the morning and at night. Apply after taking a shower. If you are not taking a shower clean entire shoulder front, back, and side along with the armpit with a clean wet washcloth.  Place a quarter-sized dollop on your shoulder and rub in thoroughly, making sure to cover the front, back, and side of your shoulder, along with the armpit.   2 days before ____ AM   ____ PM              1 day  before ____ AM   ____ PM                         Do this twice a day for two days.  (Last application is the night before surgery, AFTER using the CHG soap as described below).  Do NOT apply benzoyl peroxide gel on the day of surgery.

## 2023-01-01 NOTE — Progress Notes (Signed)
COVID Vaccine received:  []  No [x]  Yes Date of any COVID positive Test in last 90 days: no PCP - Martha Clan MD Cardiologist -  Electrophys. Steffanie Dunn MD  Chest x-ray -  EKG -  10/26/22 Epic Stress Test -  ECHO - 07/28/20 Epic Cardiac Cath -   Bowel Prep - [x]  No  []   Yes ______  Pacemaker / ICD device [x]  No []  Yes   Spinal Cord Stimulator:[x]  No []  Yes       History of Sleep Apnea? [x]  No []  Yes   CPAP used?- [x]  No []  Yes    Does the patient monitor blood sugar?          [x]  No []  Yes  []  N/A  Patient has: [x]  NO Hx DM   []  Pre-DM                 []  DM1  []   DM2 Does patient have a [employer] or Dexacom? []  No []  Yes   Fasting Blood Sugar Ranges-  Checks Blood Sugar _____ times a day  GLP1 agonist / usual dose - no GLP1 instructions:  SGLT-2 inhibitors / usual dose - no SGLT-2 instructions:   Blood Thinner / Instructions:no Aspirin Instructions:no  Comments:   Activity level: Patient is able  to climb a flight of stairs without difficulty; [x]  No CP  [x]  No SOB, ___   Patient can perform ADLs without assistance.   Anesthesia review: history of A-fib 2 years ago. Not on anticoags.  Patient denies shortness of breath, fever, cough and chest pain at PAT appointment.  Patient verbalized understanding and agreement to the Pre-Surgical Instructions that were given to them at this PAT appointment. Patient was also educated of the need to review these PAT instructions again prior to his/her surgery.I reviewed the appropriate phone numbers to call if they have any and questions or concerns.

## 2023-01-02 NOTE — Care Plan (Signed)
Ortho Bundle Case Management Note  Patient Details  Name: Catherine [name] MRN: [id number] Date of Birth: Jun 19, 1945 Patient will discharge to home with family. No DME needed. OPPT set up with [facility].                    DME Arranged:    DME Agency:     HH Arranged:    HH Agency:     Additional Comments: Please contact me with any questions of if this plan should need to change.  Shauna Hugh,  RN,BSN,MHA,CCM  [facility]  [phone] 01/02/2023, 10:17 AM

## 2023-01-03 ENCOUNTER — Encounter (HOSPITAL_COMMUNITY)
Admission: RE | Admit: 2023-01-03 | Discharge: 2023-01-03 | Disposition: A | Discharge status: Home / Self Care | Payer: PPO | Source: Ambulatory Visit | Attending: Orthopaedic Surgery | Admitting: Orthopaedic Surgery

## 2023-01-03 ENCOUNTER — Other Ambulatory Visit: Payer: Self-pay

## 2023-01-03 VITALS — BP 126/72 | HR 77 | Temp 97.6°F | Resp 16 | Ht 61.5 in | Wt 105.0 lb

## 2023-01-03 DIAGNOSIS — I4891 Unspecified atrial fibrillation: Secondary | ICD-10-CM | POA: Diagnosis not present

## 2023-01-03 DIAGNOSIS — Z01818 Encounter for other preprocedural examination: Secondary | ICD-10-CM

## 2023-01-03 DIAGNOSIS — M19012 Primary osteoarthritis, left shoulder: Secondary | ICD-10-CM | POA: Diagnosis not present

## 2023-01-03 DIAGNOSIS — Z01812 Encounter for preprocedural laboratory examination: Secondary | ICD-10-CM | POA: Diagnosis not present

## 2023-01-03 LAB — SURGICAL PCR SCREEN
MRSA, PCR: NEGATIVE
Staphylococcus aureus: NEGATIVE

## 2023-01-03 LAB — CBC
HCT: 40 % (ref 36.0–46.0)
Hemoglobin: 13.6 g/dL (ref 12.0–15.0)
MCH: 32.5 pg (ref 26.0–34.0)
MCHC: 34 g/dL (ref 30.0–36.0)
MCV: 95.5 fL (ref 80.0–100.0)
Platelets: 306 10*3/uL (ref 150–400)
RBC: 4.19 MIL/uL (ref 3.87–5.11)
RDW: 11.9 % (ref 11.5–15.5)
WBC: 7.5 10*3/uL (ref 4.0–10.5)
nRBC: 0 % (ref 0.0–0.2)

## 2023-01-03 LAB — COMPREHENSIVE METABOLIC PANEL
ALT: 15 U/L (ref 0–44)
AST: 21 U/L (ref 15–41)
Albumin: 4 g/dL (ref 3.5–5.0)
Alkaline Phosphatase: 73 U/L (ref 38–126)
Anion gap: 9 (ref 5–15)
BUN: 23 mg/dL (ref 8–23)
CO2: 25 mmol/L (ref 22–32)
Calcium: 9.4 mg/dL (ref 8.9–10.3)
Chloride: 102 mmol/L (ref 98–111)
Creatinine, Ser: 0.59 mg/dL (ref 0.44–1.00)
GFR, Estimated: >60 mL/min (ref >60)
Glucose, Bld: 97 mg/dL (ref 70–99)
Potassium: 4.4 mmol/L (ref 3.5–5.1)
Sodium: 136 mmol/L (ref 135–145)
Total Bilirubin: 0.8 mg/dL (ref <1.2)
Total Protein: 6.6 g/dL (ref 6.5–8.1)

## 2023-01-04 NOTE — Progress Notes (Signed)
Anesthesia Chart Review   Case: [id number] Date/Time: 01/08/23 1330   Procedure: REVERSE SHOULDER ARTHROPLASTY (Left: Shoulder)   Anesthesia type: General   Pre-op diagnosis: LEFT SHOULDER OSTEOARTHRITIS   Location: [facility]OM 07 / [facility]   Surgeons: Bjorn Pippin, MD       DISCUSSION:77 y.o. never smoker with h/o atrial fibrillation s/p ablation, left shoulder OA scheduled for above procedure 01/08/2023 with Dr. Ramond Marrow.   Per cardiology preoperative evaluation 12/25/2022, "Chart reviewed as part of pre-operative protocol coverage. Given past medical history and time since last visit, based on ACC/AHA guidelines, Catherine [name] would be at acceptable risk for the planned procedure without further cardiovascular testing. "  Previous anesthesia notes state pt requires pediatric equipment for intubation.  Per anesthesia note 05/18/2021, "aryngoscope Size: Glidescope and 3 Grade View: Grade I Tube type: Oral Tube size: 6.0 mm Number of attempts: 1 Airway Equipment and Method: Stylet Placement Confirmation: ETT inserted through vocal cords under direct vision, positive ETCO2 and breath sounds checked- equal and bilateral Secured at: 21 cm"  VS: BP 126/72   Pulse 77   Temp 36.4 C (Oral)   Resp 16   Ht 5' 1.5" (1.562 m)   Wt 47.6 kg   SpO2 99%   BMI 19.52 kg/m   PROVIDERS: Cleatis Polka., MD is PCP   Electrophysiologist:  Lanier Prude, MD  LABS: Labs reviewed: Acceptable for surgery. (all labs ordered are listed, but only abnormal results are displayed)  Labs Reviewed  SURGICAL PCR SCREEN  CBC  COMPREHENSIVE METABOLIC PANEL     IMAGES:   EKG:   CV: Echo 07/28/2020  1. Left ventricular ejection fraction, by estimation, is 60 to 65%. The  left ventricle has normal function. The left ventricle has no regional  wall motion abnormalities. Left ventricular diastolic parameters were  normal.   2. Right ventricular systolic function is normal. The right  ventricular  size is normal. There is normal pulmonary artery systolic pressure. The  estimated right ventricular systolic pressure is 28.1 mmHg.   3. The mitral valve is normal in structure. Trivial mitral valve  regurgitation.   4. The aortic valve was not well visualized. Aortic valve regurgitation  is not visualized. No aortic stenosis is present.   5. The inferior vena cava is dilated in size with >50% respiratory  variability, suggesting right atrial pressure of 8 mmHg.  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 Tawanna Cooler Early to co surgeon;  Surgeon: Barnett Abu, MD;  Location: [facility] OR;  Service: Neurosurgery;  Laterality: N/A;   ATRIAL FIBRILLATION ABLATION N/A 05/18/2021   Procedure: ATRIAL FIBRILLATION ABLATION;  Surgeon: Lanier Prude, MD;  Location: [facility];  Service: Cardiovascular;  Laterality: N/A;   BACK SURGERY     Fusion   CATARACT EXTRACTION W/ INTRAOCULAR LENS  IMPLANT, BILATERAL     CERVICAL BIOPSY  W/ LOOP ELECTRODE EXCISION  2006   CERVICAL CONIZATION W/BX N/A 12/28/2014    Procedure: CONIZATION CERVIX WITH BIOPSY;  Surgeon: Ok Edwards, MD;  Location: [facility];  Service: Gynecology;  Laterality: N/A;   COLONOSCOPY     COLPOSCOPY     DILATION AND CURETTAGE OF UTERUS     HARDWARE REMOVAL N/A 06/17/2018   Procedure: Removal of bilateral iliac fixation;  Surgeon: Barnett Abu, MD;  Location: [facility] OR;  Service: Neurosurgery;  Laterality: N/A;  Removal of bilateral iliac fixation   HYSTEROSCOPY WITH D & C N/A 12/28/2014   Procedure: DILATATION AND CURETTAGE /HYSTEROSCOPY Diagnostic Hysteroscopy;  Surgeon: Ok Edwards, MD;  Location: [facility];  Service: Gynecology;  Laterality: N/A;   JOINT REPLACEMENT     Left knee   KNEE SURGERY     Rt-95,Lft.-98,Replacement-09   LUMBAR LAMINECTOMY/DECOMPRESSION MICRODISCECTOMY Bilateral 08/10/2014   Procedure: Bilateral Lumbar five-Sacral one Diskectomy;  Surgeon: Barnett Abu, MD;  Location: [facility];  Service: Neurosurgery;  Laterality: Bilateral;  Bilateral L5-S1 Diskectomy   ROTATOR CUFF REPAIR  2004   TONSILLECTOMY     TOTAL KNEE ARTHROPLASTY Right 11/28/2020   Procedure: TOTAL KNEE ARTHROPLASTY;  Surgeon: Ollen Gross, MD;  Location: [facility];  Service: Orthopedics;  Laterality: Right;   TUBAL LIGATION      MEDICATIONS:  celecoxib (CELEBREX) 200 MG capsule   Cholecalciferol (VITAMIN D) 50 MCG (2000 UT) tablet   Cyanocobalamin (B-12 PO)   diazepam (VALIUM) 2 MG tablet   loratadine (CLARITIN) 10 MG tablet   Polyvinyl Alcohol-Povidone (REFRESH OP)   Probiotic Product (ALIGN) 4 MG CAPS   traMADol (ULTRAM) 50 MG tablet   zolpidem (AMBIEN) 10 MG tablet   No current facility-administered medications for this encounter.    Jodell Cipro Ward, PA-C [facility] [phone]

## 2023-01-04 NOTE — Anesthesia Preprocedure Evaluation (Signed)
Anesthesia Evaluation  Patient identified by MRN, date of birth, ID band Patient awake    Reviewed: Allergy & Precautions, H&P , NPO status , Patient's Chart, lab work & pertinent test results  History of Anesthesia Complications (+) DIFFICULT AIRWAY and history of anesthetic complications  Airway Mallampati: II  TM Distance: >3 FB Neck ROM: Full    Dental no notable dental hx. (+) Teeth Intact, Dental Advisory Given   Pulmonary neg pulmonary ROS   Pulmonary exam normal breath sounds clear to auscultation       Cardiovascular + dysrhythmias  Rhythm:Regular Rate:Normal     Neuro/Psych negative neurological ROS  negative psych ROS   GI/Hepatic negative GI ROS, Neg liver ROS,,,  Endo/Other  negative endocrine ROS    Renal/GU negative Renal ROS  negative genitourinary   Musculoskeletal  (+) Arthritis , Osteoarthritis,    Abdominal   Peds  Hematology negative hematology ROS (+)   Anesthesia Other Findings   Reproductive/Obstetrics negative OB ROS                             Anesthesia Physical Anesthesia Plan  ASA: 2  Anesthesia Plan: General   Post-op Pain Management: Regional block* and Tylenol PO (pre-op)*   Induction: Intravenous  PONV Risk Score and Plan: 4 or greater and Ondansetron, Dexamethasone and Midazolam  Airway Management Planned: Oral ETT and Video Laryngoscope Planned  Additional Equipment:   Intra-op Plan:   Post-operative Plan: Extubation in OR  Informed Consent: I have reviewed the patients History and Physical, chart, labs and discussed the procedure including the risks, benefits and alternatives for the proposed anesthesia with the patient or authorized representative who has indicated his/her understanding and acceptance.     Dental advisory given  Plan Discussed with: CRNA  Anesthesia Plan Comments: (See PAT note 01/03/2023)       Anesthesia  Quick Evaluation

## 2023-01-07 NOTE — Discharge Instructions (Signed)
Ramond Marrow MD, MPH Alfonse Alpers, PA-C [facility] [address] [phone] (tel)   [phone] (fax)   POST-OPERATIVE INSTRUCTIONS - TOTAL SHOULDER REPLACEMENT    WOUND CARE You may leave the operative dressing in place until your follow-up appointment. KEEP THE INCISIONS CLEAN AND DRY. There may be a small amount of fluid/bleeding leaking at the surgical site. This is normal after surgery.  If it fills with liquid or blood please call us immediately to change it for you. Use the provided ice machine or Ice packs as often as possible for the first 3-4 days, then as needed for pain relief.   Keep a layer of cloth or a shirt between your skin and the cooling unit to prevent frost bite as it can get very cold.  SHOWERING: - You may shower on Post-Op Day #2.  - The dressing is water resistant but do not scrub it as it may start to peel up.   - You may remove the sling for showering - Gently pat the area dry.  - Do not soak the shoulder in water.  - Do not go swimming in the pool or ocean until your incision has completely healed (about 4-6 weeks after surgery) - KEEP THE INCISIONS CLEAN AND DRY.  EXERCISES Wear the sling at all times  You may remove the sling for showering, but keep the arm across the chest or in a secondary sling.    Accidental/Purposeful External Rotation and shoulder flexion (reaching behind you) is to be avoided at all costs for the first month. It is ok to come out of your sling if your are sitting and have assistance for eating.   Do not lift anything heavier than 1 pound until we discuss it further in clinic.  It is normal for your fingers/hand to become more swollen after surgery and discolored from bruising.   This will resolve over the first few weeks usually after surgery. Please continue to ambulate and do not stay sitting or lying for too long.  Perform foot and wrist pumps to assist in circulation.  PHYSICAL  THERAPY - You will begin physical therapy soon after surgery (unless otherwise specified) - Please call to set up an appointment, if you do not already have one  - Let our office if there are any issues with scheduling your therapy  - You will have a physical therapy appointment scheduled at [facility] (across the hall from our office). Lyda Jester will reach out to you   REGIONAL ANESTHESIA (NERVE BLOCKS) The anesthesia team may have performed a nerve block for you this is a great tool used to minimize pain.   The block may start wearing off overnight (between 8-24 hours postop) When the block wears off, your pain may go from nearly zero to the pain you would have had postop without the block. This is an abrupt transition but nothing dangerous is happening.   This can be a challenging period but utilize your as needed pain medications to try and manage this period. We suggest you use the pain medication the first night prior to going to bed, to ease this transition.  You may take an extra dose of narcotic when this happens if needed   POST-OP MEDICATIONS- Multimodal approach to pain control In general your pain will be controlled with a combination of substances.  Prescriptions unless otherwise discussed are electronically sent to your pharmacy.  This is a carefully made plan we use to minimize  narcotic use.     Acetaminophen - Non-narcotic pain medicine taken on a scheduled basis  Dilauded - This is a strong narcotic, to be used only on an "as needed" basis for SEVERE pain. Aspirin 81mg  - This medicine is used to minimize the risk of blood clots after surgery.  Zofran -  take as needed for nausea Methocarbamol- this is a muscle relaxer to take as needed up to 3 times a day   FOLLOW-UP If you develop a Fever (>101.5), Redness or Drainage from the surgical incision site, please call our office to arrange for an evaluation. Please call the office to schedule a follow-up appointment for a wound  check, 7-10 days post-operatively.  IF YOU HAVE ANY QUESTIONS, PLEASE FEEL FREE TO CALL OUR OFFICE.  HELPFUL INFORMATION  Your arm will be in a sling following surgery. You will be in this sling for the next 4 weeks.   You may be more comfortable sleeping in a semi-seated position the first few nights following surgery.  Keep a pillow propped under the elbow and forearm for comfort.  If you have a recliner type of chair it might be beneficial.  If not that is fine too, but it would be helpful to sleep propped up with pillows behind your operated shoulder as well under your elbow and forearm.  This will reduce pulling on the suture lines.  When dressing, put your operative arm in the sleeve first.  When getting undressed, take your operative arm out last.  Loose fitting, button-down shirts are recommended.  In most states it is against the law to drive while your arm is in a sling. And certainly against the law to drive while taking narcotics.  You may return to work/school in the next couple of days when you feel up to it. Desk work and typing in the sling is fine.  We suggest you use the pain medication the first night prior to going to bed, in order to ease any pain when the anesthesia wears off. You should avoid taking pain medications on an empty stomach as it will make you nauseous.  You should wean off your narcotic medicines as soon as you are able.     Most patients will be off narcotics before their first postop appointment.   Do not drink alcoholic beverages or take illicit drugs when taking pain medications.  Pain medication may make you constipated.  Below are a few solutions to try in this order: Decrease the amount of pain medication if you aren't having pain. Drink lots of decaffeinated fluids. Drink prune juice and/or each dried prunes  If the first 3 don't work start with additional solutions Take Colace - an over-the-counter stool softener Take Senokot - an  over-the-counter laxative Take Miralax - a stronger over-the-counter laxative   Dental Antibiotics:  In most cases prophylactic antibiotics for Dental procdeures after total joint surgery are not necessary.  Exceptions are as follows:  1. History of prior total joint infection  2. Severely immunocompromised (Organ Transplant, cancer chemotherapy, Rheumatoid biologic meds such as Humira)  3. Poorly controlled diabetes (A1C &gt; 8.0, blood glucose over 200)  If you have one of these conditions, contact your surgeon for an antibiotic prescription, prior to your dental procedure.   For more information including helpful videos and documents visit our website:   [email or web address]

## 2023-01-07 NOTE — Progress Notes (Signed)
Left message for patient of surgery time change patient is to arrive at 1000 for a 1234 surgery time.  Patient had also requested to have Dr Autumn Patty for her anesthesiologist the day of surgery.  Spoke with Dr Hart Rochester about this and he was already aware.     Asked patient to return phone call about surgery time change.

## 2023-01-07 NOTE — Progress Notes (Signed)
Pt aware to arrive at [facility] admitting at 10 am on Tuesday Jan 08, 2023 for scheduled surgical procedure.

## 2023-01-08 ENCOUNTER — Ambulatory Visit (HOSPITAL_COMMUNITY)
Admission: RE | Admit: 2023-01-08 | Discharge: 2023-01-08 | Disposition: A | Discharge status: Home / Self Care | Payer: PPO | Attending: Orthopaedic Surgery | Admitting: Orthopaedic Surgery

## 2023-01-08 ENCOUNTER — Ambulatory Visit (HOSPITAL_COMMUNITY): Payer: Self-pay | Admitting: Physician Assistant

## 2023-01-08 ENCOUNTER — Other Ambulatory Visit: Payer: Self-pay

## 2023-01-08 ENCOUNTER — Encounter (HOSPITAL_COMMUNITY)
Admission: RE | Disposition: A | Discharge status: Home / Self Care | Payer: Self-pay | Source: Home / Self Care | Attending: Orthopaedic Surgery

## 2023-01-08 ENCOUNTER — Ambulatory Visit (HOSPITAL_COMMUNITY): Payer: PPO

## 2023-01-08 ENCOUNTER — Encounter (HOSPITAL_COMMUNITY): Payer: Self-pay | Admitting: Orthopaedic Surgery

## 2023-01-08 ENCOUNTER — Ambulatory Visit (HOSPITAL_COMMUNITY): Payer: PPO | Admitting: Anesthesiology

## 2023-01-08 DIAGNOSIS — R490 Dysphonia: Secondary | ICD-10-CM | POA: Insufficient documentation

## 2023-01-08 DIAGNOSIS — Z471 Aftercare following joint replacement surgery: Secondary | ICD-10-CM | POA: Diagnosis not present

## 2023-01-08 DIAGNOSIS — Z96612 Presence of left artificial shoulder joint: Secondary | ICD-10-CM | POA: Diagnosis not present

## 2023-01-08 DIAGNOSIS — I480 Paroxysmal atrial fibrillation: Secondary | ICD-10-CM | POA: Insufficient documentation

## 2023-01-08 DIAGNOSIS — G8918 Other acute postprocedural pain: Secondary | ICD-10-CM | POA: Diagnosis not present

## 2023-01-08 DIAGNOSIS — M75112 Incomplete rotator cuff tear or rupture of left shoulder, not specified as traumatic: Secondary | ICD-10-CM | POA: Diagnosis not present

## 2023-01-08 DIAGNOSIS — M19012 Primary osteoarthritis, left shoulder: Secondary | ICD-10-CM

## 2023-01-08 HISTORY — PX: REVERSE SHOULDER ARTHROPLASTY: SHX5054

## 2023-01-08 SURGERY — ARTHROPLASTY, SHOULDER, TOTAL, REVERSE
Anesthesia: General | Site: Shoulder | Laterality: Left

## 2023-01-08 MED ORDER — CHLORHEXIDINE GLUCONATE 0.12 % MT SOLN
15.0000 mL | Freq: Once | OROMUCOSAL | Status: AC
  Administered 2023-01-08: 15 mL via OROMUCOSAL

## 2023-01-08 MED ORDER — 0.9 % SODIUM CHLORIDE (POUR BTL) OPTIME
TOPICAL | Status: DC | PRN
  Administered 2023-01-08: 1000 mL

## 2023-01-08 MED ORDER — FENTANYL CITRATE PF 50 MCG/ML IJ SOSY
50.0000 ug | PREFILLED_SYRINGE | INTRAMUSCULAR | Status: DC
  Administered 2023-01-08 (×2): 50 ug via INTRAVENOUS
  Filled 2023-01-08: qty 2

## 2023-01-08 MED ORDER — AMISULPRIDE (ANTIEMETIC) 5 MG/2ML IV SOLN
5.0000 mg | Freq: Once | INTRAVENOUS | Status: AC
Start: 2023-01-08 — End: 2023-01-08
  Administered 2023-01-08: 5 mg via INTRAVENOUS

## 2023-01-08 MED ORDER — STERILE WATER FOR IRRIGATION IR SOLN
Status: DC | PRN
  Administered 2023-01-08: 1000 mL

## 2023-01-08 MED ORDER — ASPIRIN 81 MG PO CHEW: 81.0000 mg | CHEWABLE_TABLET | Freq: Two times a day (BID) | ORAL | 0 refills | Status: AC

## 2023-01-08 MED ORDER — ROCURONIUM BROMIDE 10 MG/ML (PF) SYRINGE
PREFILLED_SYRINGE | INTRAVENOUS | Status: AC
  Filled 2023-01-08: qty 10

## 2023-01-08 MED ORDER — FENTANYL CITRATE (PF) 100 MCG/2ML IJ SOLN
INTRAMUSCULAR | Status: AC
  Filled 2023-01-08: qty 2

## 2023-01-08 MED ORDER — LIDOCAINE HCL (PF) 2 % IJ SOLN
INTRAMUSCULAR | Status: AC
  Filled 2023-01-08: qty 5

## 2023-01-08 MED ORDER — ONDANSETRON HCL 4 MG PO TABS: 4.0000 mg | ORAL_TABLET | Freq: Three times a day (TID) | ORAL | 0 refills | Status: AC | PRN

## 2023-01-08 MED ORDER — DEXAMETHASONE SODIUM PHOSPHATE 4 MG/ML IJ SOLN
INTRAMUSCULAR | Status: DC | PRN
  Administered 2023-01-08: 8 mg via INTRAVENOUS

## 2023-01-08 MED ORDER — ACETAMINOPHEN 500 MG PO TABS
1000.0000 mg | ORAL_TABLET | Freq: Once | ORAL | Status: AC
  Administered 2023-01-08: 1000 mg via ORAL
  Filled 2023-01-08: qty 2

## 2023-01-08 MED ORDER — PROPOFOL 10 MG/ML IV BOLUS
INTRAVENOUS | Status: AC
  Filled 2023-01-08: qty 20

## 2023-01-08 MED ORDER — VANCOMYCIN HCL 1 G IV SOLR
INTRAVENOUS | Status: DC | PRN
  Administered 2023-01-08: 1000 mg

## 2023-01-08 MED ORDER — SODIUM CHLORIDE 0.9 % IR SOLN
Status: DC | PRN
  Administered 2023-01-08: 1000 mL

## 2023-01-08 MED ORDER — SUGAMMADEX SODIUM 200 MG/2ML IV SOLN
INTRAVENOUS | Status: DC | PRN
  Administered 2023-01-08: 150 mg via INTRAVENOUS

## 2023-01-08 MED ORDER — LACTATED RINGERS IV SOLN: INTRAVENOUS | Status: DC

## 2023-01-08 MED ORDER — LIDOCAINE HCL (CARDIAC) PF 100 MG/5ML IV SOSY
PREFILLED_SYRINGE | INTRAVENOUS | Status: DC | PRN
  Administered 2023-01-08: 40 mg via INTRAVENOUS

## 2023-01-08 MED ORDER — AMISULPRIDE (ANTIEMETIC) 5 MG/2ML IV SOLN
INTRAVENOUS | Status: AC
  Filled 2023-01-08: qty 2

## 2023-01-08 MED ORDER — TRANEXAMIC ACID-NACL 1000-0.7 MG/100ML-% IV SOLN
1000.0000 mg | INTRAVENOUS | Status: AC
  Administered 2023-01-08: 1000 mg via INTRAVENOUS
  Filled 2023-01-08: qty 100

## 2023-01-08 MED ORDER — DEXAMETHASONE SODIUM PHOSPHATE 10 MG/ML IJ SOLN
INTRAMUSCULAR | Status: AC
Start: 2023-01-08 — End: ?
  Filled 2023-01-08: qty 1

## 2023-01-08 MED ORDER — ONDANSETRON HCL 4 MG/2ML IJ SOLN
INTRAMUSCULAR | Status: DC | PRN
  Administered 2023-01-08: 4 mg via INTRAVENOUS

## 2023-01-08 MED ORDER — ONDANSETRON HCL 4 MG/2ML IJ SOLN
INTRAMUSCULAR | Status: AC
Start: 2023-01-08 — End: ?
  Filled 2023-01-08: qty 2

## 2023-01-08 MED ORDER — ACETAMINOPHEN 500 MG PO TABS: 1000.0000 mg | ORAL_TABLET | Freq: Three times a day (TID) | ORAL | 0 refills | Status: AC

## 2023-01-08 MED ORDER — ROCURONIUM BROMIDE 100 MG/10ML IV SOLN
INTRAVENOUS | Status: DC | PRN
  Administered 2023-01-08: 55 mg via INTRAVENOUS

## 2023-01-08 MED ORDER — FENTANYL CITRATE (PF) 100 MCG/2ML IJ SOLN
INTRAMUSCULAR | Status: DC | PRN
  Administered 2023-01-08: 50 ug via INTRAVENOUS

## 2023-01-08 MED ORDER — CELECOXIB 200 MG PO CAPS
200.0000 mg | ORAL_CAPSULE | Freq: Two times a day (BID) | ORAL | 0 refills | Status: AC
Start: 2023-01-08 — End: 2023-02-07

## 2023-01-08 MED ORDER — BUPIVACAINE-EPINEPHRINE (PF) 0.5% -1:200000 IJ SOLN
INTRAMUSCULAR | Status: DC | PRN
  Administered 2023-01-08: 15 mL via PERINEURAL

## 2023-01-08 MED ORDER — ORAL CARE MOUTH RINSE: 15.0000 mL | Freq: Once | OROMUCOSAL | Status: AC

## 2023-01-08 MED ORDER — ACETAMINOPHEN 500 MG PO TABS: 1000.0000 mg | ORAL_TABLET | Freq: Once | ORAL | Status: DC

## 2023-01-08 MED ORDER — VANCOMYCIN HCL 1000 MG IV SOLR
INTRAVENOUS | Status: AC
  Filled 2023-01-08: qty 20

## 2023-01-08 MED ORDER — BUPIVACAINE LIPOSOME 1.3 % IJ SUSP
INTRAMUSCULAR | Status: DC | PRN
  Administered 2023-01-08: 10 mL via PERINEURAL

## 2023-01-08 MED ORDER — PROPOFOL 10 MG/ML IV BOLUS
INTRAVENOUS | Status: DC | PRN
  Administered 2023-01-08: 140 mg via INTRAVENOUS

## 2023-01-08 MED ORDER — CEFAZOLIN SODIUM-DEXTROSE 2-4 GM/100ML-% IV SOLN
2.0000 g | INTRAVENOUS | Status: AC
  Administered 2023-01-08: 2 g via INTRAVENOUS
  Filled 2023-01-08: qty 100

## 2023-01-08 MED ORDER — HYDROMORPHONE HCL 2 MG PO TABS: 1.0000 mg | ORAL_TABLET | ORAL | 0 refills | Status: DC | PRN

## 2023-01-08 MED ORDER — FENTANYL CITRATE PF 50 MCG/ML IJ SOSY: 25.0000 ug | PREFILLED_SYRINGE | INTRAMUSCULAR | Status: DC | PRN

## 2023-01-08 SURGICAL SUPPLY — 57 items
BAG COUNTER SPONGE SURGICOUNT (BAG) ×1 IMPLANT
BASEPLATE GLENOID RSA 3X25 0D (Shoulder) IMPLANT
BIT DRILL 3.2 PERIPHERAL SCREW (BIT) IMPLANT
BLADE SAW SGTL 73X25 THK (BLADE) ×1 IMPLANT
CHLORAPREP W/TINT 26 (MISCELLANEOUS) ×2 IMPLANT
CLSR STERI-STRIP ANTIMIC 1/2X4 (GAUZE/BANDAGES/DRESSINGS) ×1 IMPLANT
COOLER ICEMAN CLASSIC (MISCELLANEOUS) IMPLANT
COVER BACK TABLE 60X90IN (DRAPES) ×1 IMPLANT
COVER SURGICAL LIGHT HANDLE (MISCELLANEOUS) ×1 IMPLANT
DRAPE C-ARM 42X120 X-RAY (DRAPES) IMPLANT
DRAPE INCISE IOBAN 66X45 STRL (DRAPES) ×1 IMPLANT
DRAPE POUCH INSTRU U-SHP 10X18 (DRAPES) ×1 IMPLANT
DRAPE SHEET LG 3/4 BI-LAMINATE (DRAPES) ×2 IMPLANT
DRAPE SURG ORHT 6 SPLT 77X108 (DRAPES) ×2 IMPLANT
DRSG AQUACEL AG ADV 3.5X 6 (GAUZE/BANDAGES/DRESSINGS) ×1 IMPLANT
ELECT BLADE TIP CTD 4 INCH (ELECTRODE) ×1 IMPLANT
ELECT PENCIL ROCKER SW 15FT (MISCELLANEOUS) IMPLANT
ELECT REM PT RETURN 15FT ADLT (MISCELLANEOUS) ×1 IMPLANT
FACESHIELD WRAPAROUND (MASK) ×3 IMPLANT
FACESHIELD WRAPAROUND OR TEAM (MASK) ×3 IMPLANT
GLENOIDSPHERE LATERALIZED 33 (Joint) ×1 IMPLANT
GLOVE BIO SURGEON STRL SZ 6.5 (GLOVE) ×2 IMPLANT
GLOVE BIOGEL PI IND STRL 6.5 (GLOVE) ×1 IMPLANT
GLOVE BIOGEL PI IND STRL 8 (GLOVE) ×1 IMPLANT
GLOVE ECLIPSE 8.0 STRL XLNG CF (GLOVE) ×2 IMPLANT
GOWN STRL REUS W/ TWL LRG LVL3 (GOWN DISPOSABLE) ×2 IMPLANT
GUIDE PIN 3X75 SHOULDER (PIN) ×1 IMPLANT
GUIDEWIRE GLENOID 2.5X220 (WIRE) IMPLANT
INSERT HUM PERF 1/2 33 + 0 (Insert) IMPLANT
KIT BASIN OR (CUSTOM PROCEDURE TRAY) ×1 IMPLANT
KIT STABILIZATION SHOULDER (MISCELLANEOUS) ×1 IMPLANT
KIT TURNOVER KIT A (KITS) IMPLANT
MANIFOLD NEPTUNE II (INSTRUMENTS) ×1 IMPLANT
NS IRRIG 1000ML POUR BTL (IV SOLUTION) ×1 IMPLANT
PACK SHOULDER (CUSTOM PROCEDURE TRAY) ×1 IMPLANT
PAD COLD SHLDR WRAP-ON (PAD) IMPLANT
PIN GUIDE 3X75 SHOULDER (PIN) IMPLANT
RESTRAINT HEAD UNIVERSAL NS (MISCELLANEOUS) ×1 IMPLANT
SCREW 5.0X18 (Screw) IMPLANT
SCREW 5.5X26 (Screw) IMPLANT
SCREW BONE THREAD 6.5X35 (Screw) IMPLANT
SET HNDPC FAN SPRY TIP SCT (DISPOSABLE) ×1 IMPLANT
SLING ULTRA II S (ORTHOPEDIC SUPPLIES) IMPLANT
SPHERE GLENOID LATERALIZED 33 (Joint) IMPLANT
SPONGE T-LAP 4X18 ~~LOC~~+RFID (SPONGE) ×1 IMPLANT
STEM HUMERAL PLUS LONG 1+ (Orthopedic Implant) IMPLANT
SUCTION TUBE FRAZIER 12FR DISP (SUCTIONS) IMPLANT
SUT ETHIBOND 2 V 37 (SUTURE) ×1 IMPLANT
SUT ETHIBOND NAB CT1 #1 30IN (SUTURE) ×1 IMPLANT
SUT FIBERWIRE #5 38 CONV NDL (SUTURE) ×2 IMPLANT
SUT MNCRL AB 4-0 PS2 18 (SUTURE) ×1 IMPLANT
SUT VIC AB 0 CT1 36 (SUTURE) IMPLANT
SUT VIC AB 3-0 SH 27X BRD (SUTURE) ×1 IMPLANT
SUTURE FIBERWR #5 38 CONV NDL (SUTURE) ×2 IMPLANT
TOWEL OR 17X26 10 PK STRL BLUE (TOWEL DISPOSABLE) ×1 IMPLANT
TUBE SUCTION HIGH CAP CLEAR NV (SUCTIONS) ×1 IMPLANT
WATER STERILE IRR 1000ML POUR (IV SOLUTION) ×2 IMPLANT

## 2023-01-08 NOTE — Anesthesia Procedure Notes (Signed)
Procedure Name: Intubation Date/Time: 01/08/2023 12:46 PM  Performed by: Dennison Nancy, CRNAPre-anesthesia Checklist: Patient identified, Emergency Drugs available, Suction available, Patient being monitored and Timeout performed Patient Re-evaluated:Patient Re-evaluated prior to induction Oxygen Delivery Method: Circle system utilized Preoxygenation: Pre-oxygenation with 100% oxygen Induction Type: IV induction Ventilation: Mask ventilation without difficulty Laryngoscope Size: Glidescope and 3 (LoPro S3 glidescope blade used) Grade View: Grade I Tube type: Oral Tube size: 6.0 mm Number of attempts: 1 Airway Equipment and Method: Stylet and Video-laryngoscopy Placement Confirmation: ETT inserted through vocal cords under direct vision, positive ETCO2, CO2 detector and breath sounds checked- equal and bilateral Secured at: 21 cm Tube secured with: Tape Dental Injury: Teeth and Oropharynx as per pre-operative assessment  Difficulty Due To: Difficulty was anticipated Comments: Easy GlideScope intubation.  Atraumatic. Teeth/lips intact.  From previous anesthesia record, patient a known difficult intubation; GlideScope used today with ease, 1st attempt; ETCO2+ , BBS=, +; ETT secured

## 2023-01-08 NOTE — Anesthesia Postprocedure Evaluation (Signed)
Anesthesia Post Note  Patient: Catherine [name]  Procedure(s) Performed: REVERSE SHOULDER ARTHROPLASTY (Left: Shoulder)     Patient location during evaluation: PACU Anesthesia Type: General and Regional Level of consciousness: awake and alert Pain management: pain level controlled Vital Signs Assessment: post-procedure vital signs reviewed and stable Respiratory status: spontaneous breathing, nonlabored ventilation and respiratory function stable Cardiovascular status: blood pressure returned to baseline and stable Postop Assessment: no apparent nausea or vomiting Anesthetic complications: yes  Encounter Notable Events  Notable Event Outcome Phase Comment  Difficult to intubate - expected  Intraprocedure Filed from anesthesia note documentation.    Last Vitals:  Vitals:   01/08/23 1445 01/08/23 1500  BP: 123/68 123/69  Pulse: 68 66  Resp: 14 17  Temp:    SpO2: 97% 97%    Last Pain:  Vitals:   01/08/23 1430  TempSrc:   PainSc: 4                  Nihal Doan,W. EDMOND

## 2023-01-08 NOTE — Interval H&P Note (Signed)
All questions answered, patient wants to proceed with procedure. ? ?

## 2023-01-08 NOTE — Anesthesia Procedure Notes (Signed)
Anesthesia Regional Block: Interscalene brachial plexus block   Pre-Anesthetic Checklist: , timeout performed,  Correct Patient, Correct Site, Correct Laterality,  Correct Procedure, Correct Position, site marked,  Risks and benefits discussed,  Pre-op evaluation,  At surgeon's request and post-op pain management  Laterality: Left  Prep: Maximum Sterile Barrier Precautions used, chloraprep       Needles:  Injection technique: Single-shot  Needle Type: Echogenic Stimulator Needle     Needle Length: 5cm  Needle Gauge: 22     Additional Needles:   Procedures:, nerve stimulator,,, ultrasound used (permanent image in chart),,     Nerve Stimulator or Paresthesia:  Response: Biceps response  Additional Responses:   Narrative:  Start time: 01/08/2023 11:56 AM End time: 01/08/2023 12:06 PM Injection made incrementally with aspirations every 5 mL.  Performed by: Personally  Anesthesiologist: Gaynelle Adu, MD

## 2023-01-08 NOTE — Transfer of Care (Signed)
Immediate Anesthesia Transfer of Care Note  Patient: Catherine [name]  Procedure(s) Performed: REVERSE SHOULDER ARTHROPLASTY (Left: Shoulder)  Patient Location: PACU  Anesthesia Type:General  Level of Consciousness: awake, alert , oriented, and patient cooperative  Airway & Oxygen Therapy: Patient Spontanous Breathing and Patient connected to face mask oxygen  Post-op Assessment: Report given to RN and Post -op Vital signs reviewed and stable  Post vital signs: Reviewed and stable  Last Vitals:  Vitals Value Taken Time  BP 126/90 01/08/23 1430  Temp    Pulse 82 01/08/23 1432  Resp 15 01/08/23 1432  SpO2 98 % 01/08/23 1432  Vitals shown include unfiled device data.  Last Pain:  Vitals:   01/08/23 1215  TempSrc:   PainSc: 0-No pain         Complications:  Encounter Notable Events  Notable Event Outcome Phase Comment  Difficult to intubate - expected  Intraprocedure Filed from anesthesia note documentation.

## 2023-01-08 NOTE — Op Note (Signed)
Orthopaedic Surgery Operative Note (CSN: [id number])  Catherine [name]  [birth year] Date of Surgery: 01/08/2023   Diagnoses:  Left irreparable cuff tear  Procedure: Left reverse lateralized total Shoulder Arthroplasty   Operative Finding Successful completion of planned procedure.  Patient's superior and anterior cuff are completely torn and irreparable.  She had quite small bony anatomy and it made it difficult to implant her implants.  Tug test was normal.  The patient's body habitus and BMI less than 20 I worry about bone quality and she has a higher than normal risk of stress fracture.  Will go relatively gently with physical therapy.  Post-operative plan: The patient will be NWB in sling.  The patient will be will be discharged from PACU if continues to be stable as was plan prior to surgery.  DVT prophylaxis Aspirin 81 mg twice daily for 6 weeks.  Pain control with PRN pain medication preferring oral medicines.  Follow up plan will be scheduled in approximately 7 days for incision check and XR.  Physical therapy to start early with slow progress, I communicated with a therapist specifically about the patient..  Implants: Tornier perform humeral size 1 long stem, 0 retentive polyethylene, 33+3 glenosphere with a 25+3 baseplate, 35 center screw and 2 peripheral locking screws  Post-Op Diagnosis: Same Surgeons:Primary: Bjorn Pippin, MD Assistants: Darron Doom, RNFA Location: [facility] ROOM 07 Anesthesia: General with Exparel Interscalene Antibiotics: Ancef 2g preop, Vancomycin 1000mg  locally Tourniquet time: None Estimated Blood Loss: 100 Complications: None Specimens: None Implants: Implant Name Type Inv. Item Serial No. Manufacturer Lot No. LRB No. Used Action  GLENOIDSPHERE LATERALIZED 33 - [id number] Joint GLENOIDSPHERE LATERALIZED 33 [id number] [employer]  Left 1 Implanted  BASEPLATE GLENOID RSA 3X25 0D - [id number] Shoulder BASEPLATE GLENOID RSA 3X25 0D [id number] [employer]   Left 1 Implanted  SCREW BONE THREAD 6.5X35 - [id number] Screw SCREW BONE THREAD 6.5X35  [employer]  Left 1 Implanted  SCREW 5.5X26 - [id number] Screw SCREW 5.5X26  [employer]  Left 1 Implanted  SCREW 5.0X18 - [id number] Screw SCREW 5.0X18  [employer]  Left 1 Implanted  STEM HUMERAL PLUS LONG 1+ - [id number] Orthopedic Implant STEM HUMERAL PLUS LONG 1+ [id number] [employer]  Left 1 Implanted  Retentive Reversed Insert, Thickness +0 mm   [id number] [employer]  Left 1 Implanted    Indications for Surgery:   Catherine [name] is a 77 y.o. female with irreparable rotator cuff tear.  Benefits and risks of operative and nonoperative management were discussed prior to surgery with patient/guardian(s) and informed consent form was completed.  Infection and need for further surgery were discussed as was prosthetic stability and cuff issues.  We additionally specifically discussed risks of axillary nerve injury, infection, periprosthetic fracture, continued pain and longevity of implants prior to beginning procedure.      Procedure:   The patient was identified in the preoperative holding area where the surgical site was marked. Block placed by anesthesia with exparel.  The patient was taken to the OR where a procedural timeout was called and the above noted anesthesia was induced.  The patient was positioned beachchair on allen table with spider arm positioner.  Preoperative antibiotics were dosed.  The patient's left shoulder was prepped and draped in the usual sterile fashion.  A second preoperative timeout was called.       Standard deltopectoral approach was performed with a #10 blade. We dissected down to the subcutaneous tissues and the cephalic vein was taken laterally  with the deltoid. Clavipectoral fascia was incised in line with the incision. Deep retractors were placed. The long of the biceps tendon was identified and there was significant tenosynovitis present.  Tenodesis was performed  to the pectoralis tendon with #2 Ethibond. The remaining biceps was followed up into the rotator interval where it was released.   The subscapularis was taken down in a full thickness layer with capsule along the humeral neck extending inferiorly around the humeral head. We continued releasing the capsule directly off of the osteophytes inferiorly all the way around the corner. This allowed [place] to dislocate the humeral head.   The rotator cuff was carefully examined and noted to be irreperably torn.  The decision was confirmed that a reverse total shoulder was indicated for this patient.  There were osteophytes along the inferior humeral neck. The osteophytes were removed with an osteotome and a rongeur.  Osteophytes were removed with a rongeur and an osteotome and the anatomic neck was well visualized.     A humeral cutting guide was used extra medullary with a pin to help control version. The version was set at 20 of retroversion. Humeral osteotomy was performed with an oscillating saw. The head fragment was passed off the back table.  A cut protector plate was placed.  The subscapularis was again identified and immediately we took care to palpate the axillary nerve anteriorly and verify its position with gentle palpation as well as the tug test.  We then released the SGHL with bovie cautery prior to placing a curved mayo at the junction of the anterior glenoid well above the axillary nerve and bluntly dissecting the subscapularis from the capsule.  We then carefully protected the axillary nerve as we gently released the inferior capsule to fully mobilize the subscapularis.  An anterior deltoid retractor was then placed as well as a small Hohmann retractor superiorly.  The glenoid was relatively intact in the setting of an irreparable cuff tear  The remaining labrum was removed circumferentially taking great care not to disrupt the posterior capsule.   The glenoid drill guide was placed and used to  drill a guide pin in the center, inferior position. The glenoid face was then reamed concentrically over the guide wire. The center hole was drilled over the guidepin in a near anatomic angle of version. Next the glenoid vault was drilled back to a depth of 40 mm.  We tapped and then placed a 25mm size baseplate with additional 3mm lateralization was selected with a 6.5 mm x 40 mm length central screw.  The base plate was screwed into the glenoid vault obtaining secure fixation. We next placed superior and inferior locking screws for additional fixation.  Next a 33+3 mm glenosphere was selected and impacted onto the baseplate. The center screw was tightened.  We turned attention back to the humeral side. The cut protector was removed.  We used the perform humeral sizing block to select the appropriate size which for this patient was a 1.  We then placed our center pin and reamed over it concentrically obtaining appropriate inset.  We then used our lateralizing chisel to prepare the lateral aspect of the humerus.  At that point we selected the appropriate implant trialing a 1 long.  Using this trial implant we trialed multiple polyethylene sizes settling on a 0 which provided good stability and range of motion without excess soft tissue tension. The offset was dialed in to match the normal anatomy. The shoulder was trialed.  There  was good ROM in all planes and the shoulder was stable with no inferior translation.  The real humeral implants were opened after again confirming sizes.  The trial was removed. #5 Fiberwire x4 sutures passed through the humeral neck for subscap repair. The humeral component was press-fit obtaining a secure fit. The joint was reduced and thoroughly irrigated with pulsatile lavage. Subscap was repaired back with #5 Fiberwire sutures through bone tunnels. Hemostasis was obtained. The deltopectoral interval was reapproximated with #1 Ethibond. The subcutaneous tissues were closed with 2-0  Vicryl and the skin was closed with running monocryl.    The wounds were cleaned and dried and an Aquacel dressing was placed. The drapes taken down. The arm was placed into sling with abduction pillow. Patient was awakened, extubated, and transferred to the recovery room in stable condition. There were no intraoperative complications. The sponge, needle, and attention counts were  correct at the end of the case.

## 2023-01-09 ENCOUNTER — Encounter (HOSPITAL_COMMUNITY): Payer: Self-pay | Admitting: Orthopaedic Surgery

## 2023-01-10 DIAGNOSIS — M19012 Primary osteoarthritis, left shoulder: Secondary | ICD-10-CM | POA: Diagnosis not present

## 2023-01-10 DIAGNOSIS — M75122 Complete rotator cuff tear or rupture of left shoulder, not specified as traumatic: Secondary | ICD-10-CM | POA: Diagnosis not present

## 2023-01-15 DIAGNOSIS — M19012 Primary osteoarthritis, left shoulder: Secondary | ICD-10-CM | POA: Diagnosis not present

## 2023-01-22 DIAGNOSIS — M19012 Primary osteoarthritis, left shoulder: Secondary | ICD-10-CM | POA: Diagnosis not present

## 2023-01-22 DIAGNOSIS — M75122 Complete rotator cuff tear or rupture of left shoulder, not specified as traumatic: Secondary | ICD-10-CM | POA: Diagnosis not present

## 2023-01-24 DIAGNOSIS — M19012 Primary osteoarthritis, left shoulder: Secondary | ICD-10-CM | POA: Diagnosis not present

## 2023-01-24 DIAGNOSIS — M75122 Complete rotator cuff tear or rupture of left shoulder, not specified as traumatic: Secondary | ICD-10-CM | POA: Diagnosis not present

## 2023-01-29 DIAGNOSIS — M19012 Primary osteoarthritis, left shoulder: Secondary | ICD-10-CM | POA: Diagnosis not present

## 2023-01-29 DIAGNOSIS — M75122 Complete rotator cuff tear or rupture of left shoulder, not specified as traumatic: Secondary | ICD-10-CM | POA: Diagnosis not present

## 2023-02-01 DIAGNOSIS — M19012 Primary osteoarthritis, left shoulder: Secondary | ICD-10-CM | POA: Diagnosis not present

## 2023-02-01 DIAGNOSIS — H0011 Chalazion right upper eyelid: Secondary | ICD-10-CM | POA: Diagnosis not present

## 2023-02-01 DIAGNOSIS — H04123 Dry eye syndrome of bilateral lacrimal glands: Secondary | ICD-10-CM | POA: Diagnosis not present

## 2023-02-22 DIAGNOSIS — J385 Laryngeal spasm: Secondary | ICD-10-CM | POA: Diagnosis not present

## 2023-02-22 DIAGNOSIS — J383 Other diseases of vocal cords: Secondary | ICD-10-CM | POA: Diagnosis not present

## 2023-02-25 DIAGNOSIS — M19012 Primary osteoarthritis, left shoulder: Secondary | ICD-10-CM | POA: Diagnosis not present

## 2023-02-25 DIAGNOSIS — M75122 Complete rotator cuff tear or rupture of left shoulder, not specified as traumatic: Secondary | ICD-10-CM | POA: Diagnosis not present

## 2023-03-01 DIAGNOSIS — M75122 Complete rotator cuff tear or rupture of left shoulder, not specified as traumatic: Secondary | ICD-10-CM | POA: Diagnosis not present

## 2023-03-01 DIAGNOSIS — M19012 Primary osteoarthritis, left shoulder: Secondary | ICD-10-CM | POA: Diagnosis not present

## 2023-03-05 DIAGNOSIS — M19012 Primary osteoarthritis, left shoulder: Secondary | ICD-10-CM | POA: Diagnosis not present

## 2023-03-05 DIAGNOSIS — M75122 Complete rotator cuff tear or rupture of left shoulder, not specified as traumatic: Secondary | ICD-10-CM | POA: Diagnosis not present

## 2023-03-11 DIAGNOSIS — M75122 Complete rotator cuff tear or rupture of left shoulder, not specified as traumatic: Secondary | ICD-10-CM | POA: Diagnosis not present

## 2023-03-11 DIAGNOSIS — M19012 Primary osteoarthritis, left shoulder: Secondary | ICD-10-CM | POA: Diagnosis not present

## 2023-03-25 DIAGNOSIS — M19012 Primary osteoarthritis, left shoulder: Secondary | ICD-10-CM | POA: Diagnosis not present

## 2023-03-25 DIAGNOSIS — M75122 Complete rotator cuff tear or rupture of left shoulder, not specified as traumatic: Secondary | ICD-10-CM | POA: Diagnosis not present

## 2023-03-28 DIAGNOSIS — Z85828 Personal history of other malignant neoplasm of skin: Secondary | ICD-10-CM | POA: Diagnosis not present

## 2023-03-28 DIAGNOSIS — D485 Neoplasm of uncertain behavior of skin: Secondary | ICD-10-CM | POA: Diagnosis not present

## 2023-03-28 DIAGNOSIS — D487 Neoplasm of uncertain behavior of other specified sites: Secondary | ICD-10-CM | POA: Diagnosis not present

## 2023-04-02 DIAGNOSIS — M75122 Complete rotator cuff tear or rupture of left shoulder, not specified as traumatic: Secondary | ICD-10-CM | POA: Diagnosis not present

## 2023-04-02 DIAGNOSIS — M19012 Primary osteoarthritis, left shoulder: Secondary | ICD-10-CM | POA: Diagnosis not present

## 2023-04-03 DIAGNOSIS — M13822 Other specified arthritis, left elbow: Secondary | ICD-10-CM | POA: Diagnosis not present

## 2023-04-05 DIAGNOSIS — M19012 Primary osteoarthritis, left shoulder: Secondary | ICD-10-CM | POA: Diagnosis not present

## 2023-04-09 DIAGNOSIS — M19012 Primary osteoarthritis, left shoulder: Secondary | ICD-10-CM | POA: Diagnosis not present

## 2023-04-09 DIAGNOSIS — M75122 Complete rotator cuff tear or rupture of left shoulder, not specified as traumatic: Secondary | ICD-10-CM | POA: Diagnosis not present

## 2023-04-19 DIAGNOSIS — J385 Laryngeal spasm: Secondary | ICD-10-CM | POA: Diagnosis not present

## 2023-04-23 DIAGNOSIS — M75122 Complete rotator cuff tear or rupture of left shoulder, not specified as traumatic: Secondary | ICD-10-CM | POA: Diagnosis not present

## 2023-04-23 DIAGNOSIS — M19012 Primary osteoarthritis, left shoulder: Secondary | ICD-10-CM | POA: Diagnosis not present

## 2023-04-25 ENCOUNTER — Ambulatory Visit
Admission: RE | Admit: 2023-04-25 | Discharge: 2023-04-25 | Disposition: A | Discharge status: Home / Self Care | Payer: PPO | Source: Ambulatory Visit | Attending: Internal Medicine | Admitting: Internal Medicine

## 2023-04-25 DIAGNOSIS — N958 Other specified menopausal and perimenopausal disorders: Secondary | ICD-10-CM | POA: Diagnosis not present

## 2023-04-25 DIAGNOSIS — M8588 Other specified disorders of bone density and structure, other site: Secondary | ICD-10-CM | POA: Diagnosis not present

## 2023-04-25 DIAGNOSIS — M19072 Primary osteoarthritis, left ankle and foot: Secondary | ICD-10-CM | POA: Diagnosis not present

## 2023-04-25 DIAGNOSIS — E2839 Other primary ovarian failure: Secondary | ICD-10-CM | POA: Diagnosis not present

## 2023-04-25 DIAGNOSIS — M8580 Other specified disorders of bone density and structure, unspecified site: Secondary | ICD-10-CM

## 2023-04-30 DIAGNOSIS — M47816 Spondylosis without myelopathy or radiculopathy, lumbar region: Secondary | ICD-10-CM | POA: Diagnosis not present

## 2023-04-30 DIAGNOSIS — M19071 Primary osteoarthritis, right ankle and foot: Secondary | ICD-10-CM | POA: Diagnosis not present

## 2023-05-02 DIAGNOSIS — M19072 Primary osteoarthritis, left ankle and foot: Secondary | ICD-10-CM | POA: Diagnosis not present

## 2023-05-08 DIAGNOSIS — M19071 Primary osteoarthritis, right ankle and foot: Secondary | ICD-10-CM | POA: Diagnosis not present

## 2023-05-08 DIAGNOSIS — M47816 Spondylosis without myelopathy or radiculopathy, lumbar region: Secondary | ICD-10-CM | POA: Diagnosis not present

## 2023-05-14 DIAGNOSIS — M19071 Primary osteoarthritis, right ankle and foot: Secondary | ICD-10-CM | POA: Diagnosis not present

## 2023-05-14 DIAGNOSIS — M47816 Spondylosis without myelopathy or radiculopathy, lumbar region: Secondary | ICD-10-CM | POA: Diagnosis not present

## 2023-05-28 DIAGNOSIS — M47816 Spondylosis without myelopathy or radiculopathy, lumbar region: Secondary | ICD-10-CM | POA: Diagnosis not present

## 2023-05-28 DIAGNOSIS — M19071 Primary osteoarthritis, right ankle and foot: Secondary | ICD-10-CM | POA: Diagnosis not present

## 2023-06-04 DIAGNOSIS — M19071 Primary osteoarthritis, right ankle and foot: Secondary | ICD-10-CM | POA: Diagnosis not present

## 2023-06-04 DIAGNOSIS — M47816 Spondylosis without myelopathy or radiculopathy, lumbar region: Secondary | ICD-10-CM | POA: Diagnosis not present

## 2023-06-10 DIAGNOSIS — L720 Epidermal cyst: Secondary | ICD-10-CM | POA: Diagnosis not present

## 2023-06-10 DIAGNOSIS — D485 Neoplasm of uncertain behavior of skin: Secondary | ICD-10-CM | POA: Diagnosis not present

## 2023-06-10 DIAGNOSIS — Z85828 Personal history of other malignant neoplasm of skin: Secondary | ICD-10-CM | POA: Diagnosis not present

## 2023-06-10 DIAGNOSIS — L821 Other seborrheic keratosis: Secondary | ICD-10-CM | POA: Diagnosis not present

## 2023-06-10 DIAGNOSIS — L570 Actinic keratosis: Secondary | ICD-10-CM | POA: Diagnosis not present

## 2023-07-19 DIAGNOSIS — J385 Laryngeal spasm: Secondary | ICD-10-CM | POA: Diagnosis not present

## 2023-09-02 DIAGNOSIS — R7301 Impaired fasting glucose: Secondary | ICD-10-CM | POA: Diagnosis not present

## 2023-09-02 DIAGNOSIS — Z79899 Other long term (current) drug therapy: Secondary | ICD-10-CM | POA: Diagnosis not present

## 2023-09-09 DIAGNOSIS — Z1339 Encounter for screening examination for other mental health and behavioral disorders: Secondary | ICD-10-CM | POA: Diagnosis not present

## 2023-09-09 DIAGNOSIS — I480 Paroxysmal atrial fibrillation: Secondary | ICD-10-CM | POA: Diagnosis not present

## 2023-09-09 DIAGNOSIS — M5416 Radiculopathy, lumbar region: Secondary | ICD-10-CM | POA: Diagnosis not present

## 2023-09-09 DIAGNOSIS — M48061 Spinal stenosis, lumbar region without neurogenic claudication: Secondary | ICD-10-CM | POA: Diagnosis not present

## 2023-09-09 DIAGNOSIS — R490 Dysphonia: Secondary | ICD-10-CM | POA: Diagnosis not present

## 2023-09-09 DIAGNOSIS — M8580 Other specified disorders of bone density and structure, unspecified site: Secondary | ICD-10-CM | POA: Diagnosis not present

## 2023-09-09 DIAGNOSIS — R7301 Impaired fasting glucose: Secondary | ICD-10-CM | POA: Diagnosis not present

## 2023-09-09 DIAGNOSIS — G4700 Insomnia, unspecified: Secondary | ICD-10-CM | POA: Diagnosis not present

## 2023-09-09 DIAGNOSIS — Z0000 Encounter for general adult medical examination without abnormal findings: Secondary | ICD-10-CM | POA: Diagnosis not present

## 2023-09-09 DIAGNOSIS — R82998 Other abnormal findings in urine: Secondary | ICD-10-CM | POA: Diagnosis not present

## 2023-09-09 DIAGNOSIS — Z1331 Encounter for screening for depression: Secondary | ICD-10-CM | POA: Diagnosis not present

## 2023-09-09 DIAGNOSIS — M159 Polyosteoarthritis, unspecified: Secondary | ICD-10-CM | POA: Diagnosis not present

## 2023-09-27 ENCOUNTER — Other Ambulatory Visit: Payer: Self-pay | Admitting: Internal Medicine

## 2023-09-27 DIAGNOSIS — Z1231 Encounter for screening mammogram for malignant neoplasm of breast: Secondary | ICD-10-CM

## 2023-10-09 DIAGNOSIS — M4306 Spondylolysis, lumbar region: Secondary | ICD-10-CM | POA: Diagnosis not present

## 2023-10-09 DIAGNOSIS — M4716 Other spondylosis with myelopathy, lumbar region: Secondary | ICD-10-CM | POA: Diagnosis not present

## 2023-10-09 DIAGNOSIS — G8929 Other chronic pain: Secondary | ICD-10-CM | POA: Diagnosis not present

## 2023-10-11 DIAGNOSIS — J385 Laryngeal spasm: Secondary | ICD-10-CM | POA: Diagnosis not present

## 2023-10-14 ENCOUNTER — Ambulatory Visit
Admission: RE | Admit: 2023-10-14 | Discharge: 2023-10-14 | Disposition: A | Discharge status: Home / Self Care | Source: Ambulatory Visit | Attending: Internal Medicine | Admitting: Internal Medicine

## 2023-10-14 DIAGNOSIS — Z1231 Encounter for screening mammogram for malignant neoplasm of breast: Secondary | ICD-10-CM | POA: Diagnosis not present

## 2023-10-21 ENCOUNTER — Encounter: Payer: Self-pay | Admitting: Student

## 2023-10-21 ENCOUNTER — Ambulatory Visit: Attending: Student | Admitting: Student

## 2023-10-21 VITALS — BP 112/60 | HR 78 | Ht 61.5 in | Wt 104.0 lb

## 2023-10-21 DIAGNOSIS — I480 Paroxysmal atrial fibrillation: Secondary | ICD-10-CM | POA: Diagnosis not present

## 2023-10-21 NOTE — Patient Instructions (Signed)
 Medication Instructions:   Your physician recommends that you continue on your current medications as directed. Please refer to the Current Medication list given to you today.   *If you need a refill on your cardiac medications before your next appointment, please call your pharmacy*   Lab Work: NONE ORDERED  TODAY     If you have labs (blood work) drawn today and your tests are completely normal, you will receive your results only by: MyChart Message (if you have MyChart) OR A paper copy in the mail If you have any lab test that is abnormal or we need to change your treatment, we will call you to review the results.  Testing/Procedures: NONE ORDERED  TODAY    Follow-Up: At [facility], you and your health needs are our priority.  As part of our continuing mission to provide you with exceptional heart care, our providers are all part of one team.  This team includes your primary Cardiologist (physician) and Advanced Practice Providers or APPs (Physician Assistants and Nurse Practitioners) who all work together to provide you with the care you need, when you need it.  Your next appointment:    1 year(s)   Provider:   You may see Boyce Byes, MD or one of the following Advanced Practice Providers on your designated Care Team:   Mertha Abrahams, [place]     We recommend signing up for the patient portal called "MyChart".  Sign up information is provided on this After Visit Summary.  MyChart is used to connect with patients for Virtual Visits (Telemedicine).  Patients are able to view lab/test results, encounter notes, upcoming appointments, etc.  Non-urgent messages can be sent to your provider as well.   To learn more about what you can do with MyChart, go to [email or web address].   Other Instructions

## 2023-10-21 NOTE — Progress Notes (Signed)
  Electrophysiology Office Note:   Date:  10/21/2023  ID:  FRANCELIA MCLAREN, DOB July 17, 1945, MRN [id number]  Primary Cardiologist: None Electrophysiologist: OLE ONEIDA HOLTS, MD   Electrophysiologist:  OLE ONEIDA HOLTS, MD      History of Present Illness:   Catherine [name] is a 78 y.o. female with h/o PAF s/p ablation 04/2021 and severe OA seen today for routine electrophysiology followup.   Since last being seen in our clinic the patient reports doing very well. She has had 2 very short episodes of palpitations in the past year, longest lasting 5 minutes. Apple watch did say possible AF. Otherwise,  she denies chest pain, dyspnea, PND, orthopnea, nausea, vomiting, dizziness, syncope, edema, weight gain, or early satiety.   Review of systems complete and found to be negative unless listed in HPI.   EP Information / Studies Reviewed:    EKG is ordered today. Personal review as below.  EKG Interpretation Date/Time:  Monday October 21 2023 12:04:18 EDT Ventricular Rate:  78 PR Interval:  200 QRS Duration:  88 QT Interval:  384 QTC Calculation: 437 R Axis:   -21  Text Interpretation: Sinus rhythm with Premature atrial complexes Low voltage QRS Cannot rule out Anterior infarct (cited on or before 21-Oct-2023) When compared with ECG of 26-Oct-2022 09:49, Premature atrial complexes are now Present Questionable change in initial forces of Anterior leads Confirmed by Lesia Sharper [phone]) on 10/21/2023 12:10:15 PM    Arrhythmia/Device History S/p AF Ablation 04/2021   Physical Exam:   VS:  BP 112/60   Pulse 78   Ht 5' 1.5 (1.562 m)   Wt 104 lb (47.2 kg)   SpO2 99%   BMI 19.33 kg/m    Wt Readings from Last 3 Encounters:  10/21/23 104 lb (47.2 kg)  01/08/23 105 lb (47.6 kg)  01/03/23 105 lb (47.6 kg)     GEN: No acute distress NECK: No JVD; No carotid bruits CARDIAC: Regular rate and rhythm, no murmurs, rubs, gallops RESPIRATORY:  Clear to auscultation without rales,  wheezing or rhonchi  ABDOMEN: Soft, non-tender, non-distended EXTREMITIES:  No edema; No deformity   ASSESSMENT AND PLAN:    Paroxysmal AF EKG today shows NSR CHA2DS2/VASc is at least 3.  Have had several long discussions about optimal monitoring. Have again offered loop recorder for more complete monitoring; She wishes to continue using her Apple Watch to monitor her AF.  LAA anatomy not suitable for Watchman   If were to have more or longer episodes, could monitor to see what her palpitations actually represent.  If her risk factors increase, may need to again consider OAC.     Arthritis Stable and improved on Celebrex    Follow up with EP Team in 12 months. Sooner with arrhythmia   Signed, Sharper Prentice Lesia, PA-C

## 2024-01-07 NOTE — Progress Notes (Unsigned)
 Electrophysiology Office Note:   Date:  01/08/2024  ID:  Catherine [name], DOB [birth year], MRN [id number]  Primary Cardiologist: None Primary Heart Failure: None Electrophysiologist: OLE ONEIDA HOLTS, MD      History of Present Illness:   Catherine [name] is a 78 y.o. female with h/o atrial fibrillation, obstructive sleep apnea seen today for routine electrophysiology followup.   Discussed the use of AI scribe software for clinical note transcription with the patient, who gave verbal consent to proceed.  History of Present Illness Catherine [name] is a 78 year old female with atrial fibrillation who presents with recurrent episodes of AFib.  She has a history of atrial fibrillation and underwent a successful ablation a few years ago. Prior to the ablation, she experienced intermittent episodes of AFib that were exhausting and caused significant fatigue and headaches, but no shortness of breath. Her heart rate was much higher during those episodes compared to her current episodes.  Since November 30th, she has been experiencing shorter episodes of AFib, sometimes lasting an hour, but occasionally up to five or six hours. These episodes are accompanied by headaches and blurry vision, which she did not experience before. She has been having these episodes daily since she started. No shortness of breath was noted during these episodes.  She has a history of orthopedic issues, including back fusions, knee replacements, and a reverse shoulder replacement. She experiences significant joint pain and swelling, particularly in her ankle and elbow, which she manages with Celebrex . She had stopped taking Celebrex  after her ablation when she was not experiencing AFib episodes, but resumed it after the episodes restarted in November.  She has been on Eliquis  in the past and is concerned about the risk of stroke associated with AFib and the interaction between Eliquis  and Celebrex , which increases  the risk of bleeding.  Her past cardiac evaluations include a normal echocardiogram in 2022 and a coronary CT performed a few years ago. She has not had any rapid AFib episodes recently, with her heart rate remaining in the seventies to nineties during episodes. She has used a beta blocker, Tysam, in the past for rapid AFib but has not needed it recently.  she denies chest pain, palpitations, dyspnea, PND, orthopnea, nausea, vomiting, dizziness, syncope, edema, weight gain, or early satiety.   Review of systems complete and found to be negative unless listed in HPI.   EP Information / Studies Reviewed:    EKG is ordered today. Personal review as below.  EKG Interpretation Date/Time:  Wednesday January 08 2024 10:04:05 EST Ventricular Rate:  72 PR Interval:  200 QRS Duration:  86 QT Interval:  402 QTC Calculation: 440 R Axis:   78  Text Interpretation: Sinus rhythm with Premature supraventricular complexes Low voltage QRS When compared with ECG of 21-Oct-2023 12:04, No significant change since last tracing Confirmed by Taneah Masri ([id number]) on 01/08/2024 10:28:53 AM   Risk Assessment/Calculations:    CHA2DS2-VASc Score = 3   This indicates a 3.2% annual risk of stroke. The patient's score is based upon: CHF History: 0 HTN History: 0 Diabetes History: 0 Stroke History: 0 Vascular Disease History: 0 Age Score: 2 Gender Score: 1            Physical Exam:   VS:  BP 114/70 (BP Location: Right Arm, Patient Position: Sitting, Cuff Size: Normal)   Pulse 72   Ht 5' 1.5 (1.562 m)   Wt 110 lb 4.8 oz (50 kg)   SpO2 96%  BMI 20.50 kg/m    Wt Readings from Last 3 Encounters:  01/08/24 110 lb 4.8 oz (50 kg)  10/21/23 104 lb (47.2 kg)  01/08/23 105 lb (47.6 kg)     GEN: Well nourished, well developed in no acute distress NECK: No JVD; No carotid bruits CARDIAC: Regular rate and rhythm, no murmurs, rubs, gallops RESPIRATORY:  Clear to auscultation without rales, wheezing or  rhonchi  ABDOMEN: Soft, non-tender, non-distended EXTREMITIES:  No edema; No deformity   ASSESSMENT AND PLAN:    1.  Paroxysmal atrial fibrillation: Post ablation in April 2023.  Had watchman evaluation but left atrial appendage not suitable for watchman.  Monitors atrial fibrillation through her Apple watch.  She feels poorly in atrial fibrillation.  She has had an increased burden over the last few weeks.  She would prefer rhythm control.  She is interested in repeat ablation.  She wants to avoid long-term antiarrhythmics.  [name] plan for ablation for her atrial fibrillation.  [name] start propafenone  225 mg twice daily prior to that.  She [name] start Eliquis  3 weeks prior to ablation.  [name] potentially stop her Eliquis  3 months after ablation so that she can start taking her Celebrex  again.  Risk, benefits, and alternatives to EP study and radiofrequency/pulse field ablation for afib were also discussed in detail today. These risks include but are not limited to stroke, bleeding, vascular damage, tamponade, perforation, damage to the esophagus, lungs, and other structures, pulmonary vein stenosis, worsening renal function, and death. The patient understands these risk and wishes to proceed.  We [name] therefore proceed with catheter ablation at the next available time.  Carto, ICE, anesthesia are requested for the procedure.  This patient [name] require CT prior to ablation. To be scheduled.   2.  Secondary hypercoagulable state: Not anticoagulated due to need for NSAIDs for severe arthritis  Follow up with EP Team as usual post procedure  Signed, [name], MD

## 2024-01-08 ENCOUNTER — Other Ambulatory Visit (HOSPITAL_COMMUNITY): Payer: Self-pay

## 2024-01-08 ENCOUNTER — Ambulatory Visit: Admitting: Cardiology

## 2024-01-08 ENCOUNTER — Encounter: Payer: Self-pay | Admitting: Cardiology

## 2024-01-08 VITALS — BP 114/70 | HR 72 | Ht 61.5 in | Wt 110.3 lb

## 2024-01-08 DIAGNOSIS — I480 Paroxysmal atrial fibrillation: Secondary | ICD-10-CM | POA: Diagnosis not present

## 2024-01-08 DIAGNOSIS — Z01812 Encounter for preprocedural laboratory examination: Secondary | ICD-10-CM

## 2024-01-08 MED ORDER — APIXABAN 5 MG PO TABS
5.0000 mg | ORAL_TABLET | Freq: Two times a day (BID) | ORAL | 0 refills | Status: DC
  Filled 2024-01-08: qty 60, 30d supply, fill #0

## 2024-01-08 MED ORDER — PROPAFENONE HCL ER 225 MG PO CP12: 225.0000 mg | ORAL_CAPSULE | Freq: Two times a day (BID) | ORAL | 11 refills | Status: AC

## 2024-01-08 NOTE — Patient Instructions (Signed)
 Medication Instructions:  Your physician has recommended you make the following change in your medication:  START Propafenone  225 mg twice daily START Eliquis  5 mg twice daily  - you will start this 1 month prior to your ablation   *If you need a refill on your cardiac medications before your next appointment, please call your pharmacy*   Lab Work: Pre procedure labs -- we will call you to schedule:  BMP & CBC  If you have a lab test that is abnormal and we need to change your treatment, we will call you to review the results -- otherwise no news is good news.    Testing/Procedures: Your physician has requested that you have cardiac CT 3 weeks PRIOR to your ablation. Cardiac computed tomography (CT) is a painless test that uses an x-ray machine to take clear, detailed pictures of your heart. We will contact you if the result is abnormal. We will call you to schedule.  Your physician has recommended that you have an ablation. Catheter ablation is a medical procedure used to treat some cardiac arrhythmias (irregular heartbeats). During catheter ablation, a long, thin, flexible tube is put into a blood vessel in your groin (upper thigh), or neck. This tube is called an ablation catheter. It is then guided to your heart through the blood vessel. Radio frequency waves destroy small areas of heart tissue where abnormal heartbeats may cause an arrhythmia to start. Please review the information below on ablation and after care.  Your ablation is scheduled for 03/11/2024. Please arrive at [facility] at 8:30 am.  We will call/send instructions at a later date.   Follow-Up: At [facility], you and your health needs are our priority.  As part of our continuing mission to provide you with exceptional heart care, we have created designated Provider Care Teams.  These Care Teams include your primary Cardiologist (physician) and Advanced Practice Providers (APPs -  Physician Assistants and Nurse  Practitioners) who all work together to provide you with the care you need, when you need it.  Your next appointment:   1 month(s) after your ablation  The format for your next appointment:   In Person  Provider:   [facility] clinic   Thank you for choosing [facility]!!   Maeola Domino, RN [phone]    Other Instructions   Cardiac Ablation Cardiac ablation is a procedure to destroy (ablate) some heart tissue that is sending bad signals. These bad signals cause problems in heart rhythm. The heart has many areas that make these signals. If there are problems in these areas, they can make the heart beat in a way that is not normal. Destroying some tissues can help make the heart rhythm normal. Tell your doctor about: Any allergies you have. All medicines you are taking. These include vitamins, herbs, eye drops, creams, and over-the-counter medicines. Any problems you or family members have had with medicines that make you fall asleep (anesthetics). Any blood disorders you have. Any surgeries you have had. Any medical conditions you have, such as kidney failure. Whether you are pregnant or may be pregnant. What are the risks? This is a safe procedure. But problems may occur, including: Infection. Bruising and bleeding. Bleeding into the chest. Stroke or blood clots. Damage to nearby areas of your body. Allergies to medicines or dyes. The need for a pacemaker if the normal system is damaged. Failure of the procedure to treat the problem. What happens before the procedure? Medicines Ask your doctor  about: Changing or stopping your normal medicines. This is important. Taking aspirin  and ibuprofen. Do not take these medicines unless your doctor tells you to take them. Taking other medicines, vitamins, herbs, and supplements. General instructions Follow instructions from your doctor about what you cannot eat or drink. Plan to have someone take you home from the hospital  or clinic. If you will be going home right after the procedure, plan to have someone with you for 24 hours. Ask your doctor what steps will be taken to prevent infection. What happens during the procedure?  An IV tube will be put into one of your veins. You will be given a medicine to help you relax. The skin on your neck or groin will be numbed. A cut (incision) will be made in your neck or groin. A needle will be put through your cut and into a large vein. A tube (catheter) will be put into the needle. The tube will be moved to your heart. Dye may be put through the tube. This helps your doctor see your heart. Small devices (electrodes) on the tube will send out signals. A type of energy will be used to destroy some heart tissue. The tube will be taken out. Pressure will be held on your cut. This helps stop bleeding. A bandage will be put over your cut. The exact procedure may vary among doctors and hospitals. What happens after the procedure? You will be watched until you leave the hospital or clinic. This includes checking your heart rate, breathing rate, oxygen, and blood pressure. Your cut will be watched for bleeding. You will need to lie still for a few hours. Do not drive for 24 hours or as long as your doctor tells you. Summary Cardiac ablation is a procedure to destroy some heart tissue. This is done to treat heart rhythm problems. Tell your doctor about any medical conditions you may have. Tell him or her about all medicines you are taking to treat them. This is a safe procedure. But problems may occur. These include infection, bruising, bleeding, and damage to nearby areas of your body. Follow what your doctor tells you about food and drink. You may also be told to change or stop some of your medicines. After the procedure, do not drive for 24 hours or as long as your doctor tells you. This information is not intended to replace advice given to you by your health care  provider. Make sure you discuss any questions you have with your health care provider. Document Revised: 03/31/2021 Document Reviewed: 12/11/2018 Elsevier Patient Education  2023 [employer]   Cardiac Ablation, Care After  This sheet gives you information about how to care for yourself after your procedure. Your health care provider may also give you more specific instructions. If you have problems or questions, contact your health care provider. What can I expect after the procedure? After the procedure, it is common to have: Bruising around your puncture site. Tenderness around your puncture site. Skipped heartbeats. If you had an atrial fibrillation ablation, you may have atrial fibrillation during the first several months after your procedure.  Tiredness (fatigue).  Follow these instructions at home: Puncture site care  Follow instructions from your health care provider about how to take care of your puncture site. Make sure you: If present, leave stitches (sutures), skin glue, or adhesive strips in place. These skin closures may need to stay in place for up to 2 weeks. If adhesive strip edges start to loosen  and curl up, you may trim the loose edges. Do not remove adhesive strips completely unless your health care provider tells you to do that. If a large square bandage is present, this may be removed 24 hours after surgery.  Check your puncture site every day for signs of infection. Check for: Redness, swelling, or pain. Fluid or blood. If your puncture site starts to bleed, lie down on your back, apply firm pressure to the area, and contact your health care provider. Warmth. Pus or a bad smell. A pea or marble sized lump/knot at the site is normal and can take up to three months to resolve.  Driving Do not drive for at least 4 days after your procedure or however long your health care provider recommends. (Do not resume driving if you have previously been instructed not to drive  for other health reasons.) Do not drive or use heavy machinery while taking prescription pain medicine. Activity Avoid activities that take a lot of effort for at least 7 days after your procedure. Do not lift anything that is heavier than 5 lb (4.5 kg) for one week.  No sexual activity for 1 week.  Return to your normal activities as told by your health care provider. Ask your health care provider what activities are safe for you. General instructions Take over-the-counter and prescription medicines only as told by your health care provider. Do not use any products that contain nicotine or tobacco, such as cigarettes and e-cigarettes. If you need help quitting, ask your health care provider. You may shower after 24 hours, but Do not take baths, swim, or use a hot tub for 1 week.  Do not drink alcohol  for 24 hours after your procedure. Keep all follow-up visits as told by your health care provider. This is important. Contact a health care provider if: You have redness, mild swelling, or pain around your puncture site. You have fluid or blood coming from your puncture site that stops after applying firm pressure to the area. Your puncture site feels warm to the touch. You have pus or a bad smell coming from your puncture site. You have a fever. You have chest pain or discomfort that spreads to your neck, jaw, or arm. You have chest pain that is worse with lying on your back or taking a deep breath. You are sweating a lot. You feel nauseous. You have a fast or irregular heartbeat. You have shortness of breath. You are dizzy or light-headed and feel the need to lie down. You have pain or numbness in the arm or leg closest to your puncture site. Get help right away if: Your puncture site suddenly swells. Your puncture site is bleeding and the bleeding does not stop after applying firm pressure to the area. These symptoms may represent a serious problem that is an emergency. Do not wait to  see if the symptoms will go away. Get medical help right away. Call your local emergency services (911 in the [place] Do not drive yourself to the hospital. Summary After the procedure, it is normal to have bruising and tenderness at the puncture site in your groin, neck, or forearm. Check your puncture site every day for signs of infection. Get help right away if your puncture site is bleeding and the bleeding does not stop after applying firm pressure to the area. This is a medical emergency. This information is not intended to replace advice given to you by your health care provider. Make sure you discuss any  questions you have with your health care provider.

## 2024-01-09 ENCOUNTER — Telehealth: Payer: Self-pay | Admitting: Cardiology

## 2024-01-09 NOTE — Telephone Encounter (Signed)
 Pt calling to reschedule her Ablation. Please advise.

## 2024-01-10 NOTE — Telephone Encounter (Signed)
 Patient states her grandchildren are coming into town in February and she will be watching them from 2/19 to 2/22.   Patient requests to reschedule ablation to either 2/25, 2/26, or 2/27. She is free these days and states OK to just schedule before calling her.   Informed patient Maeola is out of the office until the week of 01/20/24, but I will forward this message to her and our EP procedure scheduler.

## 2024-01-10 NOTE — Telephone Encounter (Signed)
 Called pt and moved her Ablation with Dr. Inocencio to 2/26 at 11:30 am. Informed her that we would send Instructions soon.

## 2024-01-10 NOTE — Telephone Encounter (Signed)
 Patient called again to follow-up with RN Sherri P. on getting her ablation on 2/18 rescheduled to 2/25, 2/26 or 2/27.Catherine [name]

## 2024-01-29 ENCOUNTER — Other Ambulatory Visit: Payer: Self-pay | Admitting: *Deleted

## 2024-01-29 MED ORDER — APIXABAN 5 MG PO TABS: 5.0000 mg | ORAL_TABLET | Freq: Two times a day (BID) | ORAL | 6 refills | Status: AC

## 2024-02-14 ENCOUNTER — Telehealth: Payer: Self-pay

## 2024-02-14 NOTE — Telephone Encounter (Signed)
-----   Message from Nurse Maeola SQUIBB, RN sent at 01/29/2024  9:13 AM EST ----- Regarding: 03/19/24  AFib Ablation Precert:  MD: Camnitz Type of ablation: A-fib Diagnosis: tachycardia CPT code: A-fib (06343) Ablation scheduled (date/time): 03/19/24  11:30 am  Procedure:  Added to calendar? Yes Orders entered? Yes Letter complete? No, >30 days before procedure Scheduled with cath lab? Yes Any medications to hold? Yes (please list hold instructions): routine      -- Pt  is start Eliquis  1 month prior (Rx has been sent) Labs ordered (CBC, BMET, PT/INR if on warfarin): Yes Mapping system: CARTO (lab 4 or 6) CARTO/OPAL rep notified? Yes Cardiac CT needed? Yes, ordered Dye allergy? No Pre-meds ordered and instructions given? N/a Letter method: MyChart H&P: 01/08/24 Device: No  Follow-up:  Cassie/Angel, please schedule Routine.

## 2024-02-18 ENCOUNTER — Telehealth (HOSPITAL_COMMUNITY): Payer: Self-pay

## 2024-02-18 ENCOUNTER — Encounter (HOSPITAL_COMMUNITY): Payer: Self-pay

## 2024-02-18 NOTE — Telephone Encounter (Signed)
 Attempted to reach patient to discuss upcoming procedure, no answer. Left VM for patient to return call.

## 2024-02-19 ENCOUNTER — Other Ambulatory Visit (HOSPITAL_COMMUNITY)

## 2024-02-20 NOTE — Telephone Encounter (Signed)
 Spoke with patient to complete pre-procedure call.     Health status review:  Any new medical conditions, recent signs of acute illness or been started on antibiotics? Patient reports she has had diarrhea x 14 days and given Ciprofloxacin to take for possible bacterial cause, without any resolve. She plans to contact PCP office. Will follow up with patient at a later time to re-evaluate.  Any recent hospitalizations or surgeries? No Any new medications started since pre-op visit?  Cipro 500 mg x 7 days. Started Eliquis  on 02/19/24.  Follow all medication instructions prior to procedure or the procedure may be rescheduled:    Continue taking Eliquis  (Apixaban ) twice daily without missing any doses before procedure. Essential chronic medications:  No medication should be continued, unless told otherwise. On the morning of your procedure DO NOT take any medication., including Eliquis  (Apixaban ).  Nothing to eat or drink after midnight prior to your procedure.  Pre-procedure testing scheduled: CT and lab work on February 5.  Confirmed patient is scheduled for Atrial Fibrillation Ablation on Thursday, February 26 with Dr. Inocencio. Instructed patient to arrive at the Main Entrance A at [facility]: [address] [place], [place] [address] and check in at Admitting at 9:30 AM.  Plan to go home the same day, you will only stay overnight if medically necessary. You MUST have a responsible adult to drive you home and MUST be with you the first 24 hours after you arrive home or your procedure could be cancelled.  Informed a nurse may call a day before the procedure to confirm arrival time and ensure instructions are followed.  Patient verbalized understanding to information provided and is agreeable to proceed with procedure.   Advised to contact RN Navigator at [phone], to inform of any new medications started after call or concerns prior to procedure.

## 2024-02-27 ENCOUNTER — Ambulatory Visit (HOSPITAL_COMMUNITY)
Admission: RE | Admit: 2024-02-27 | Discharge: 2024-02-27 | Disposition: A | Source: Ambulatory Visit | Attending: Cardiology | Admitting: Cardiology

## 2024-02-27 DIAGNOSIS — I480 Paroxysmal atrial fibrillation: Secondary | ICD-10-CM

## 2024-02-27 LAB — CBC

## 2024-02-27 MED ORDER — IOHEXOL 350 MG/ML SOLN
80.0000 mL | Freq: Once | INTRAVENOUS | Status: AC | PRN
  Administered 2024-02-27: 80 mL via INTRAVENOUS

## 2024-02-28 LAB — CBC
Hematocrit: 37.1 (ref 34.0–46.6)
Hemoglobin: 12.6 g/dL (ref 11.1–15.9)
MCH: 32 pg (ref 26.6–33.0)
MCHC: 34 g/dL (ref 31.5–35.7)
MCV: 94 fL (ref 79–97)
Platelets: 401 10*3/uL (ref 150–450)
RBC: 3.94 x10E6/uL (ref 3.77–5.28)
RDW: 12 (ref 11.7–15.4)
WBC: 6.9 10*3/uL (ref 3.4–10.8)

## 2024-02-28 LAB — BASIC METABOLIC PANEL WITH GFR
BUN/Creatinine Ratio: 25 (ref 12–28)
BUN: 17 mg/dL (ref 8–27)
CO2: 21 mmol/L (ref 20–29)
Calcium: 9.3 mg/dL (ref 8.7–10.3)
Chloride: 100 mmol/L (ref 96–106)
Creatinine, Ser: 0.68 mg/dL (ref 0.57–1.00)
Glucose: 79 mg/dL (ref 70–99)
Potassium: 4.7 mmol/L (ref 3.5–5.2)
Sodium: 135 mmol/L (ref 134–144)
eGFR: 89 mL/min/{1.73_m2}

## 2024-03-19 ENCOUNTER — Encounter (HOSPITAL_COMMUNITY): Payer: Self-pay

## 2024-03-19 ENCOUNTER — Ambulatory Visit (HOSPITAL_COMMUNITY): Admit: 2024-03-19 | Admitting: Cardiology

## 2024-03-19 SURGERY — ATRIAL FIBRILLATION ABLATION
Anesthesia: General
